# Patient Record
Sex: Female | Born: 1940 | Race: White | Hispanic: No | Smoking: Former smoker
Health system: Southern US, Community
[De-identification: ages and names within clinical notes are randomized; demographics above are authoritative.]

## PROBLEM LIST (undated history)

## (undated) DIAGNOSIS — I4901 Ventricular fibrillation: Secondary | ICD-10-CM

## (undated) DIAGNOSIS — R112 Nausea with vomiting, unspecified: Secondary | ICD-10-CM

## (undated) DIAGNOSIS — I219 Acute myocardial infarction, unspecified: Secondary | ICD-10-CM

## (undated) DIAGNOSIS — Z95810 Presence of automatic (implantable) cardiac defibrillator: Secondary | ICD-10-CM

## (undated) DIAGNOSIS — I10 Essential (primary) hypertension: Secondary | ICD-10-CM

## (undated) DIAGNOSIS — M1990 Unspecified osteoarthritis, unspecified site: Secondary | ICD-10-CM

## (undated) DIAGNOSIS — Z9889 Other specified postprocedural states: Secondary | ICD-10-CM

## (undated) HISTORY — DX: Ventricular fibrillation: I49.01

## (undated) HISTORY — PX: IMPLANTABLE CARDIOVERTER DEFIBRILLATOR IMPLANT: SHX5860

## (undated) HISTORY — DX: Essential (primary) hypertension: I10

---

## 1999-08-10 ENCOUNTER — Inpatient Hospital Stay (HOSPITAL_COMMUNITY): Admission: EM | Admit: 1999-08-10 | Discharge: 1999-08-18 | Payer: Self-pay | Admitting: Emergency Medicine

## 1999-08-10 ENCOUNTER — Encounter: Payer: Self-pay | Admitting: Emergency Medicine

## 1999-08-11 ENCOUNTER — Encounter: Payer: Self-pay | Admitting: Emergency Medicine

## 1999-08-13 ENCOUNTER — Encounter: Payer: Self-pay | Admitting: Critical Care Medicine

## 1999-08-16 ENCOUNTER — Encounter: Payer: Self-pay | Admitting: Internal Medicine

## 1999-08-18 ENCOUNTER — Encounter: Payer: Self-pay | Admitting: *Deleted

## 2004-11-30 ENCOUNTER — Ambulatory Visit: Payer: Self-pay

## 2005-04-28 ENCOUNTER — Ambulatory Visit: Payer: Self-pay | Admitting: Internal Medicine

## 2005-11-08 ENCOUNTER — Ambulatory Visit: Payer: Self-pay | Admitting: Internal Medicine

## 2006-03-15 ENCOUNTER — Ambulatory Visit: Payer: Self-pay | Admitting: Internal Medicine

## 2006-08-25 ENCOUNTER — Ambulatory Visit: Payer: Self-pay

## 2006-09-01 ENCOUNTER — Ambulatory Visit: Payer: Self-pay | Admitting: Internal Medicine

## 2006-10-07 ENCOUNTER — Ambulatory Visit: Payer: Self-pay | Admitting: Internal Medicine

## 2006-10-07 LAB — CONVERTED CEMR LAB
BUN: 14 mg/dL (ref 6–23)
CO2: 30 meq/L (ref 19–32)
Calcium: 9.2 mg/dL (ref 8.4–10.5)
Chloride: 98 meq/L (ref 96–112)
Creatinine, Ser: 0.7 mg/dL (ref 0.4–1.2)
GFR calc non Af Amer: 89 mL/min
Glomerular Filtration Rate, Af Am: 108 mL/min/{1.73_m2}
Glucose, Bld: 97 mg/dL (ref 70–99)
HCT: 40.4 % (ref 36.0–46.0)
Hemoglobin: 13.5 g/dL (ref 12.0–15.0)
INR: 0.9 (ref 0.9–2.0)
MCHC: 33.5 g/dL (ref 30.0–36.0)
MCV: 87.9 fL (ref 78.0–100.0)
Platelets: 271 10*3/uL (ref 150–400)
Potassium: 4.3 meq/L (ref 3.5–5.1)
Prothrombin Time: 11.6 s (ref 10.0–14.0)
RBC: 4.6 M/uL (ref 3.87–5.11)
RDW: 13.8 % (ref 11.5–14.6)
Sodium: 136 meq/L (ref 135–145)
WBC: 7.8 10*3/uL (ref 4.5–10.5)
aPTT: 32.3 s (ref 26.5–36.5)

## 2006-10-14 ENCOUNTER — Ambulatory Visit: Payer: Self-pay | Admitting: Internal Medicine

## 2006-10-14 ENCOUNTER — Ambulatory Visit (HOSPITAL_COMMUNITY): Admission: RE | Admit: 2006-10-14 | Discharge: 2006-10-14 | Payer: Self-pay | Admitting: Internal Medicine

## 2006-10-27 ENCOUNTER — Ambulatory Visit: Payer: Self-pay

## 2007-04-18 ENCOUNTER — Ambulatory Visit: Payer: Self-pay | Admitting: Internal Medicine

## 2007-07-18 ENCOUNTER — Ambulatory Visit: Payer: Self-pay | Admitting: Internal Medicine

## 2007-11-14 ENCOUNTER — Ambulatory Visit: Payer: Self-pay | Admitting: Internal Medicine

## 2008-02-13 ENCOUNTER — Ambulatory Visit: Payer: Self-pay | Admitting: Internal Medicine

## 2008-05-14 ENCOUNTER — Ambulatory Visit: Payer: Self-pay | Admitting: Internal Medicine

## 2008-08-13 ENCOUNTER — Ambulatory Visit: Payer: Self-pay | Admitting: Internal Medicine

## 2008-11-12 ENCOUNTER — Ambulatory Visit: Payer: Self-pay | Admitting: Internal Medicine

## 2009-02-14 ENCOUNTER — Ambulatory Visit: Payer: Self-pay | Admitting: Internal Medicine

## 2009-03-25 ENCOUNTER — Encounter: Payer: Self-pay | Admitting: Internal Medicine

## 2009-05-20 ENCOUNTER — Ambulatory Visit: Payer: Self-pay | Admitting: Internal Medicine

## 2009-06-13 ENCOUNTER — Encounter: Payer: Self-pay | Admitting: Internal Medicine

## 2009-08-19 ENCOUNTER — Ambulatory Visit: Payer: Self-pay | Admitting: Internal Medicine

## 2009-09-03 ENCOUNTER — Encounter: Payer: Self-pay | Admitting: Internal Medicine

## 2009-11-06 ENCOUNTER — Encounter: Payer: Self-pay | Admitting: Internal Medicine

## 2010-01-09 DIAGNOSIS — I10 Essential (primary) hypertension: Secondary | ICD-10-CM | POA: Insufficient documentation

## 2010-02-03 ENCOUNTER — Ambulatory Visit: Payer: Self-pay | Admitting: Internal Medicine

## 2010-02-03 DIAGNOSIS — I4901 Ventricular fibrillation: Secondary | ICD-10-CM

## 2010-05-05 ENCOUNTER — Ambulatory Visit: Payer: Self-pay | Admitting: Internal Medicine

## 2010-08-06 ENCOUNTER — Ambulatory Visit: Payer: Self-pay | Admitting: Internal Medicine

## 2010-09-04 ENCOUNTER — Telehealth: Payer: Self-pay | Admitting: Internal Medicine

## 2010-09-09 ENCOUNTER — Encounter: Payer: Self-pay | Admitting: Internal Medicine

## 2010-11-05 ENCOUNTER — Ambulatory Visit: Payer: Self-pay | Admitting: Internal Medicine

## 2010-11-18 ENCOUNTER — Encounter: Payer: Self-pay | Admitting: Internal Medicine

## 2011-01-26 NOTE — Cardiovascular Report (Signed)
Summary: Office Visit Remote   Office Visit Remote   Imported By: Roderic Ovens 09/15/2010 14:05:31  _____________________________________________________________________  External Attachment:    Type:   Image     Comment:   External Document

## 2011-01-26 NOTE — Assessment & Plan Note (Signed)
Summary: pc2  Medications Added CARVEDILOL 25 MG TABS (CARVEDILOL) 1 by mouth two times a day      Allergies Added:   Visit Type:  Follow-up Primary Provider:  Urgent Care   History of Present Illness: April [name] returns today for followup.  She is a pleasant middle aged woman with a rescucitated VF arrest, s/p ICD implant who has severe HTN.  She denies medical or dietary non-compliance and despite this, has had blood pressures over 200 mmHg in the past.  When I last saw her, we started carvedilol and have uptitrated it now to 25 mg twice daily.  Her blood pressure is much improved.  She denies c/p, sob, or intercurrent ICD therapies.  No syncope. No HA.  Current Medications (verified): 1)  Klor-Con 20 Meq Pack (Potassium Chloride) .... Once Daily 2)  Enalapril Maleate 10 Mg Tabs (Enalapril Maleate) .... Take 1 By Mouth Two Times A Day 3)  Hydrochlorothiazide 25 Mg Tabs (Hydrochlorothiazide) .... Take 1 By Mouth Once Daily 4)  Carvedilol 25 Mg Tabs (Carvedilol) .Marland Kitchen.. 1 By Mouth Two Times A Day  Allergies (verified): 1)  ! Penicillin  Past History:  Past Medical History: Last updated: 01/09/2010 Current Problems:  AICD,  MEDTRONIC GEM III (ICD-V45.02) HYPERTENSION (ICD-401.9)    Review of Systems  The patient denies chest pain, syncope, dyspnea on exertion, and peripheral edema.    Vital Signs:  Patient profile:   70 year old female Height:      64 inches Weight:      147 pounds BMI:     25.32 Pulse rate:   57 / minute BP sitting:   120 / 80  (left arm)  Vitals Entered By: Laurance Flatten CMA (May 05, 2010 4:29 PM)  Physical Exam  General:  Well developed, well nourished, in no acute distress.  HEENT: normal Neck: supple. No JVD. Carotids 2+ bilaterally no bruits Cor: RRR no rubs. A S4 gallop and a soft MR murmur are present. Lungs: CTA.  No wheezes, rales, or rhonchi. Well healed ICD incision. Ab: soft, nontender. nondistended. No HSM. Good bowel sounds Ext:  warm. no cyanosis, clubbing or edema Neuro: alert and oriented. Grossly nonfocal. affect pleasant     ICD Specifications Following MD:  Lewayne Bunting, MD     ICD Vendor:  Medtronic     ICD Model Number:  7232     ICD Serial Number:  [id number] H ICD DOI:  10/14/2006     ICD Implanting MD:  Lewayne Bunting, MD  Lead 1:    Location: RV     DOI: 08/17/1999     Model #: [id number]     Serial #: [id number]     Status: active  Indications::  VF ARREST   ICD Follow Up Remote Check?  No Battery Voltage:  3.07 V     Charge Time:  8.63 seconds     Underlying rhythm:  SR ICD Dependent:  No       ICD Device Measurements Right Ventricle:  Amplitude: 4.2 mV, Impedance: 504 ohms, Threshold: 1.0 V at 0.2 msec Shock Impedance: 48/60 ohms   Episodes Coumadin:  No Shock:  0     ATP:  0     Nonsustained:  0     Ventricular Pacing:  <0.1%  Brady Parameters Mode VVI     Lower Rate Limit:  40      Tachy Zones VF:  182     Next Remote Date:  08/06/2010  Next Cardiology Appt Due:  04/27/2011 Tech Comments:  No parameter changes.  Device function normal.  Carelink transmissions every 3 months.  ROV 1 year with Dr. Ladona Ridgel. Altha Harm, LPN  May 05, 2010 4:25 PM  MD Comments:  Agree with above.  Impression & Recommendations:  Problem # 1:  HYPERTENSION (ICD-401.9) Her blood pressure is much better controlled today.  I have asked her to continue her current meds, maintain a low sodium diet and continue to walk daily. Her updated medication list for this problem includes:    Enalapril Maleate 10 Mg Tabs (Enalapril maleate) .Marland Kitchen... Take 1 by mouth two times a day    Hydrochlorothiazide 25 Mg Tabs (Hydrochlorothiazide) .Marland Kitchen... Take 1 by mouth once daily    Carvedilol 25 Mg Tabs (Carvedilol) .Marland Kitchen... 1 by mouth two times a day  Problem # 2:  AICD,  MEDTRONIC GEM III (ICD-V45.02) Her device is working normally (Maximo VR).  Will recheck in several months.

## 2011-01-26 NOTE — Letter (Signed)
Summary: Remote Device Check  [facility], Main Office  [address]   [place], [place] [address]   Phone: [phone]  Fax: [phone]     November 18, 2010 MRN: [id number]   [facility] [address] [place], [place]  [address]   Dear Ms. April [name],   Your remote transmission was recieved and reviewed by your physician.  All diagnostics were within normal limits for you.  __X___Your next transmission is scheduled for: 02-04-11.  Please transmit at any time this day.  If you have a wireless device your transmission will be sent automatically.   Sincerely,  Vella Kohler

## 2011-01-26 NOTE — Cardiovascular Report (Signed)
Summary: Office Visit Remote   Office Visit Remote   Imported By: Roderic Ovens 11/30/2010 14:50:42  _____________________________________________________________________  External Attachment:    Type:   Image     Comment:   External Document

## 2011-01-26 NOTE — Letter (Signed)
Summary: Remote Device Check  [facility], Main Office  [address]   [place], [place] [address]   Phone: [phone]  Fax: [phone]     September 09, 2010 MRN: [id number]   [facility] [address] [place], [place]  [address]   Dear Ms. April [name],   Your remote transmission was recieved and reviewed by your physician.  All diagnostics were within normal limits for you.  __X___Your next transmission is scheduled for:  11-05-10.  Please transmit at any time this day.  If you have a wireless device your transmission will be sent automatically.   Sincerely,  Vella Kohler

## 2011-01-26 NOTE — Assessment & Plan Note (Signed)
Summary: [email or web address]   CC:  Device Check; HTN.  History of Present Illness: April [name] returns today for followup.  She is a pleasant middle aged woman with a rescucitated VF arrest, s/p ICD implant who has severe HTN.  She denies medical or dietary non-compliance and despite this, has had blood pressures over 200 mmHg!  Despite this, she denies c/p, sob, or intercurrent ICD therapies.  No syncope. No HA.  Problems Prior to Update: 1)  Aicd, Medtronic Gem Iii  (ICD-V45.02) 2)  Hypertension  (ICD-401.9)  Current Medications (verified): 1)  Klor-Con 20 Meq Pack (Potassium Chloride) .... Once Daily 2)  Enalapril Maleate 10 Mg Tabs (Enalapril Maleate) .... Take 1 By Mouth Two Times A Day 3)  Hydrochlorothiazide 25 Mg Tabs (Hydrochlorothiazide) .... Take 1 By Mouth Once Daily 4)  Metoprolol Tartrate 25 Mg Tabs (Metoprolol Tartrate) .... Take One Tablet By Mouth Twice A Day  Allergies (verified): 1)  ! Penicillin  Past History:  Past Medical History: Last updated: 01/09/2010 Current Problems:  AICD,  MEDTRONIC GEM III (ICD-V45.02) HYPERTENSION (ICD-401.9)    Past Surgical History: Last updated: 01/09/2010  DATE OF PROCEDURE:  10/14/2006 PROCEDURE PERFORMED:  Removal of her previously  implanted ICD which had  reached ERI and insertion of a new ICD with defibrillation testing.  PHYSICIAN:  Doylene Canning. Ladona Ridgel, MD  Review of Systems  The patient denies chest pain, syncope, dyspnea on exertion, and peripheral edema.    Vital Signs:  Patient profile:   70 year old female Height:      64 inches Weight:      148 pounds BMI:     25.50 Pulse rate:   66 / minute BP sitting:   218 / 80  (left arm) Cuff size:   regular  Vitals Entered By: Stanton Kidney, EMT-P (February 03, 2010 2:39 PM)  Serial Vital Signs/Assessments:  Time      Position  BP       Pulse  Resp  Temp     By 2:40 PM             204/78                         Stanton Kidney, EMT-P  Comments: 2:40  PM (R) Arm-Sitting By: Stanton Kidney, EMT-P    Physical Exam  General:  Well developed, well nourished, in no acute distress.  HEENT: normal Neck: supple. No JVD. Carotids 2+ bilaterally no bruits Cor: RRR no rubs. A S4 gallop and a soft MR murmur are present. Lungs: CTA.  No wheezes, rales, or rhonchi. Well healed ICD incision. Ab: soft, nontender. nondistended. No HSM. Good bowel sounds Ext: warm. no cyanosis, clubbing or edema Neuro: alert and oriented. Grossly nonfocal. affect pleasant     ICD Specifications Following MD:  Lewayne Bunting, MD     ICD Vendor:  Medtronic     ICD Model Number:  7232     ICD Serial Number:  [id number] H ICD DOI:  10/14/2006     ICD Implanting MD:  Lewayne Bunting, MD  Lead 1:    Location: RV     DOI: 08/17/1999     Model #: 4098     Serial #: [id number]     Status: active  Indications::  VF ARREST   ICD Follow Up Battery Voltage:  3.10 V     Charge Time:  8.48 seconds     Underlying rhythm:  SR ICD  Dependent:  No       ICD Device Measurements Right Ventricle:  Amplitude: 4.2 mV, Impedance: 536 ohms, Threshold: 1.0 V at 0.2 msec Shock Impedance: 56213 ohms   Episodes Shock:  0     ATP:  0     Nonsustained:  1      Brady Parameters Mode VVI     Lower Rate Limit:  40      Tachy Zones VF:  182     Next Remote Date:  05/05/2010     Next Cardiology Appt Due:  01/27/2011 Tech Comments:  No parameter changes.  Device function normal.  Carelink transmissions every 3 months.  ROV 1 year Dr. Ladona Ridgel. April Harm, LPN  February 03, 2010 2:46 PM  MD Comments:  Agree with above.  Impression & Recommendations:  Problem # 1:  AICD,  MEDTRONIC GEM III (ICD-V45.02) Her device is working normally.  Will recheck in several months.  Problem # 2:  HYPERTENSION (ICD-401.9) Her blood pressure remains poorly controlled.  I have recommended changing to carvedilol from metoprolol.  I will start her on 12.5 mg twice daily for two weeks then increase to 25 mg twice  daily.  Ultimately she may require more. A low sodium diet is recommended. Her updated medication list for this problem includes:    Enalapril Maleate 10 Mg Tabs (Enalapril maleate) .Marland Kitchen... Take 1 by mouth two times a day    Hydrochlorothiazide 25 Mg Tabs (Hydrochlorothiazide) .Marland Kitchen... Take 1 by mouth once daily    Carvedilol 25 Mg Tabs (Carvedilol) .Marland Kitchen... Take 1/2 tablet by mouth two times a day for 2 weeks then increase to 1 by mouth two times a day  Her updated medication list for this problem includes:    Enalapril Maleate 10 Mg Tabs (Enalapril maleate) .Marland Kitchen... Take 1 by mouth two times a day    Hydrochlorothiazide 25 Mg Tabs (Hydrochlorothiazide) .Marland Kitchen... Take 1 by mouth once daily    Carvedilol 25 Mg Tabs (Carvedilol) .Marland Kitchen... Take 1/2 tablet by mouth two times a day for 2 weeks then increase to 1 by mouth two times a day  Problem # 3:  VENTRICULAR FIBRILLATION (ICD-427.41) No intercurrent ICD therapies. Her updated medication list for this problem includes:    Enalapril Maleate 10 Mg Tabs (Enalapril maleate) .Marland Kitchen... Take 1 by mouth two times a day    Carvedilol 25 Mg Tabs (Carvedilol) .Marland Kitchen... Take 1/2 tablet by mouth two times a day for 2 weeks then increase to 1 by mouth two times a day  Patient Instructions: 1)  Your physician recommends that you schedule a follow-up appointment in: 3 months with Dr Sherilyn Dacosta 2)  Your physician has recommended you make the following change in your medication: stop Metoprolol start Carvedilol 25mg  1/2 tablet two times a day for 2 weeks then increase to one tablet two times a day  Prescriptions: CARVEDILOL 25 MG TABS (CARVEDILOL) take 1/2 tablet by mouth two times a day for 2 weeks then increase to 1 by mouth two times a day  #60 x 6   Entered by:   Dennis Bast, RN, BSN   Authorized by:   Laren Boom, MD, [facility]   Signed by:   Dennis Bast, RN, BSN on 02/03/2010   Method used:   Electronically to        [facility]  [phone]* (retail)       [address]  [place], [place]  [address]       Ph: [phone] or [phone]       Fax: [phone]   RxID:   [id number]

## 2011-01-26 NOTE — Progress Notes (Signed)
Summary: refill   Phone Note Refill Request Message from:  Patient on September 04, 2010 2:17 PM  Refills Requested: Medication #1:  CARVEDILOL 25 MG TABS 1 by mouth two times a day. [facility] [phone]  pt is out medication need refill today  Initial call taken by: Judie Grieve,  September 04, 2010 2:18 PM    Prescriptions: CARVEDILOL 25 MG TABS (CARVEDILOL) 1 by mouth two times a day  #60 x 3   Entered by:   Laurance Flatten CMA   Authorized by:   Laren Boom, MD, [facility]   Signed by:   Laurance Flatten CMA on 09/04/2010   Method used:   Electronically to        [facility]  [phone]* (retail)       [address]       [place], [place]  [address]       Ph: [phone] or [phone]       Fax: [phone]   RxID:   [id number]

## 2011-02-04 ENCOUNTER — Encounter (INDEPENDENT_AMBULATORY_CARE_PROVIDER_SITE_OTHER): Payer: 59

## 2011-02-04 ENCOUNTER — Encounter: Payer: Self-pay | Admitting: Internal Medicine

## 2011-02-04 DIAGNOSIS — I428 Other cardiomyopathies: Secondary | ICD-10-CM

## 2011-02-23 NOTE — Cardiovascular Report (Signed)
Summary: Office Visit   Office Visit   Imported By: Roderic Ovens 02/17/2011 15:48:57  _____________________________________________________________________  External Attachment:    Type:   Image     Comment:   External Document

## 2011-05-11 NOTE — Assessment & Plan Note (Signed)
[facility]                         ELECTROPHYSIOLOGY OFFICE NOTE   NAME:COPPAGEParveen, [name]                         MRN:          [id number]  DATE:11/12/2008                            DOB:          15-Jul-1941    April [name] returns today for followup.  She is a very pleasant middle-  aged woman with a VF arrest status post resuscitation with a history of  hypertension, status post ICD insertion.  She returns today for  followup.  She has been stable.  She continues to exercise regularly.  Despite this, her blood pressure has remained elevated.  She denies  chest pain.  She denies shortness of breath.  She has had no  intercurrent ICD therapies.  She notes that her blood pressure remains  elevated some, but for the most part has been recently well controlled.   CURRENT MEDICATIONS:  1. Hydrochlorothiazide 25 a day.  2. Potassium 20 a day.  3. Enalapril twice daily (I think 5 mg).  4. Lopressor 25 twice a day.   PHYSICAL EXAMINATION:  GENERAL:  She is a pleasant middle-aged woman in  no distress.  VITAL SIGNS:  Blood pressure 158/72, by me 140/80, the pulse 62 and  regular, the respirations were 18, and the weight was 142 pounds down 9  pounds for her visit a year ago.  NECK:  No jugular venous distention.  LUNGS:  Clear bilaterally to auscultation.  No wheezes, rales, or  rhonchi are present.  There is no increased work of breathing.  CARDIOVASCULAR:  Regular rate rhythm.  Normal S1 and S2.  Soft S4 gallop  was present.  ABDOMEN:  Soft, nontender.  There was no organomegaly.  EXTREMITIES:  No edema.   Interrogation of the defibrillator demonstrates a Medtronic Maximo.  The  R-waves were 4.  The impedance 520.  The threshold a volt of 0.2.  Battery voltage was 3.13 V.  There are no intercurrent ICD therapies.  She had one episode of nonsustained VT.   IMPRESSION:  1. Status post ventricular fibrillation arrest.  2. Hypertension.  3. Status post  implantable cardioverter-defibrillator insertion.   DISCUSSION:  Overall, April [name] is stable and her defibrillator is  working normally.  She has had only 1 episode of nonsustained VT.  She  does have hypertension which is moderately well controlled, and she will  continue on her medicines for this.  I will see the patient back in the  office for ICD followup in 1 year.    Doylene Canning. Ladona Ridgel, MD  Electronically Signed   GWT/MedQ  DD: 11/12/2008  DT: 11/13/2008  Job #: [id number]

## 2011-05-11 NOTE — Assessment & Plan Note (Signed)
[facility]                         ELECTROPHYSIOLOGY OFFICE NOTE   NAME:[name], [name]                         MRN:          [id number]  DATE:11/14/2007                            DOB:          1941-02-06    April [name] returns today for follow-up.  She is a very pleasant middle-  aged woman with a history of primary VF arrest status post  resuscitation, history of hypertension, who returns for follow-up.  She  is status post ICD insertion. She denies chest pain or shortness of  breath and overall has felt well.  She does note that she tripped and  fell earlier today and has a laceration on her chin but denies any other  problems or complaints from her fall.   MEDICATIONS:  1. Hydrochlorothiazide 25 mg daily.  2. Potassium 20 mEq daily.  3. Enalapril 5 mg twice daily.  4. Lopressor 25 mg twice daily.   PHYSICAL EXAMINATION:  VITAL SIGNS:  Blood pressure was 110/70, pulse 84  and regular, respirations 18, weight 151 pounds.  GENERAL APPEARANCE:  She is a pleasant, well-appearing middle-aged woman  in no distress.  NECK:  No jugular venous distension.  LUNGS:  Clear to auscultation bilaterally.  No wheezing, rhonchi or  rales were present.  There was no increased work of breathing.  EXTREMITIES:  No clubbing, cyanosis, or edema.  Pulses were 2+ and  symmetric.   Interrogation of her defibrillator demonstrates a Medtronic Maximo with  R-waves of 6, impedance 552 ohms, threshold is a volt at 0.2.  Battery  voltage was 3.17 volts.  There were no intercurrent IC therapies.  There  was one nonsustained episode of VT.   IMPRESSION:  1. Primary ventricular fibrillation arrest.  2. Hypertension.   DISCUSSION:  Overall, April [name] is stable.  Her defibrillator is  working normally and she has had no intercurrent IC therapies.  We will  plan to see her back in the clinic in a year with me and follow up in  our CareLink program.     Doylene Canning.  Ladona Ridgel, MD  Electronically Signed    GWT/MedQ  DD: 11/14/2007  DT: 11/15/2007  Job #: [id number]

## 2011-05-14 NOTE — Assessment & Plan Note (Signed)
[facility]                           ELECTROPHYSIOLOGY OFFICE NOTE   NAME:COPPAGEMayia, Megill                         MRN:          [id number]  DATE:09/01/2006                            DOB:          1941/09/17    Ms. Lehan returns today in follow up. She is a very pleasant 70 year old  woman with history of VF arrest, ischemic heart disease and hypertension  status post ICD insertion, who is now ERI.  She started having beeping  sounds over Labor Day. She denies chest pain or shortness of breath. She  does note that whenever she comes to our office her blood pressure is  elevated and she feels tense.  She states that her blood pressure is not  elevated when she is at home.   PHYSICAL EXAMINATION:  GENERAL:  She is a pleasant, well-appearing middle-  aged woman in no distress.  VITAL SIGNS:  Blood pressure was 184/82, the pulse 74 and regular,  respirations were 18. The weight was 152 pounds.  NECK:  Revealed no jugular venous distension.  LUNGS:  Clear bilaterally to auscultation.  CARDIOVASCULAR EXAM:  Regular rate and rhythm with normal S1 and S2, there  is a soft S4 gallop present.  EXTREMITIES:  Demonstrated no edema.   EKG demonstrates sinus rhythm, normal axis and intervals.   INTERROGATION OF DEFIBRILLATOR:  Confirms that she is in fact ERI with a  voltage of 2.55 volts. She has had no intercurrent IC therapies.   IMPRESSION:  1. Ischemic heart disease.  2. Status post resuscitative VF arrest.  3. Hypertension.  4. Status post ICD insertion, now at [facility].   DISCUSSION:  The risks, benefits, goals and expectations of ICD generator  change have been discussed with the patient and she would like to proceed  with this. We will schedule it at the earliest possible convenient time.                                   Doylene Canning. Ladona Ridgel, MD   GWT/MedQ  DD:  09/01/2006  DT:  09/01/2006  Job #:  [id number]

## 2011-05-14 NOTE — Assessment & Plan Note (Signed)
[facility]                         ELECTROPHYSIOLOGY OFFICE NOTE   NAME:[name], April [name]                         MRN:          [id number]  DATE:04/18/2007                            DOB:          November 28, 1941    April [name] returns today for followup.  She is a very pleasant, 70-year-  old woman with a history of VF arrest, ischemic heart disease with  preserved LV function, and hypertension.  She is status post ICD  insertion, recently undergoing ICD generator change.  She returns today  for followup.  She has been under increasing stress lately secondary to  her daughter's baby being born with Arnold-Chiari and having a very  difficult, prolonged stay in the hospital.  The patient denies chest  pain, she denies shortness of breath, she denies peripheral edema.   MEDICATIONS:  HCTZ 25 a day, potassium, Enalapril, and Lopressor.   PHYSICAL EXAMINATION:  GENERAL:  A pleasant, well-appearing, middle-aged  woman in no distress.  VITAL SIGNS:  The blood pressure was 200/80, the pulse 80 and regular,  the respirations were 18, and the weight was 154 pounds.  NECK:  No jugulovenous distention.  LUNGS:  Clear bilaterally to auscultation, no wheezes, rales, or  rhonchi.  CARDIOVASCULAR:  Regular rate and rhythm with normal S1 and S2, there is  a soft S4 gallop.  EXTREMITIES:  No edema.   Interrogation of her defibrillator demonstrates a Medtronic Maximo with  R-waves of 8, the impedance 552 ohms, a threshold 1 volt at 0.2 msec.  The battery voltage was 3.17 volts.  She was 0% V-paced.   IMPRESSION:  1. Coronary artery disease.  2. Status post ventricular fibrillation arrest.  3. Status post implantable cardioverter-defibrillator insertion.  4. Hypertension.   DISCUSSION:  April [name] is stable, her blood pressure is elevated  though she does note to be under extra stress.  I have recommended that  she continue her medical therapy and call us if her blood  pressure  remains persistently elevated.     Doylene Canning. Ladona Ridgel, MD  Electronically Signed    GWT/MedQ  DD: 04/18/2007  DT: 04/18/2007  Job #: [id number]

## 2011-05-14 NOTE — Assessment & Plan Note (Signed)
[facility]                           ELECTROPHYSIOLOGY OFFICE NOTE   NAME:Delvecchio, KASSADEE                         MRN:          [id number]  DATE:08/25/2006                            DOB:          [birth year]    Ms. Thomann was seen in the clinic today, on 25 August 2006, for follow up  of her Medtronic model #7229 Gem.  Date of implant was August 17, 1999, for  VF and sudden cardiac death.   On interrogation of her device today her battery voltage is 2.55 which is  elective replacement with a charge time of 10.84 seconds.  R waves measured  12 mV with ventricular pacing threshold of 1.3 msec and a ventricular lead  impedance of 611 ohms.  Shock impedance was 14.  There were no episodes  since last interrogation.   She is scheduled to see Dr. Ladona Ridgel next week for a workup for her change out  of her device.                                   Altha Harm, LPN                                Doylene Canning. Ladona Ridgel, MD   PO/MedQ  DD:  08/26/2006  DT:  08/26/2006  Job #:  [id number]

## 2011-05-14 NOTE — Op Note (Signed)
[name], [name]                ACCOUNT NO.:  [id number]   MEDICAL RECORD NO.:  [id number]          PATIENT TYPE:  OIB   LOCATION:  2899                         FACILITY:  [facility]   PHYSICIAN:  Doylene Canning. Ladona Ridgel, MD    DATE OF BIRTH:  1941-03-09   DATE OF PROCEDURE:  10/14/2006  DATE OF DISCHARGE:  10/14/2006                                 OPERATIVE REPORT   PROCEDURE PERFORMED:  Removal of her previously  implanted ICD which had  reached ERI and insertion of a new ICD with defibrillation testing.   INTRODUCTION:  The patient is a very pleasant 70 year old woman who first  presented with a resuscitated VF arrest back in 2000.  She underwent  implantation of a Gem ICD, a Medtronic Gem III ICD.  Her device has  subsequently reached ERI over 7 years out from initial implant.  She is now  referred for generator change.   PROCEDURE:  After informed consent was obtained, the patient is taken  diagnostic EP lab in fasting state.  After usual preparation and draping,  intravenous fentanyl Midazolam was given for sedation.  Also the patient was  given intravenous Phenergan secondary to a history of nausea with fentanyl  and with sedation.  30 mL lidocaine was infiltrated into the left  infraclavicular region.  A 7 cm incision was carried out over this region.  Electrocautery utilized to dissect down to the fascial plane.  The  subcutaneous pocket was entered without difficulty and electrocautery  utilized to free up the pocket from its dense fibrous adhesions.  This  Medtronic generator was subsequently removed without difficulty.  The lead  was firm freed up from the generator and the R-waves measured 13, the  impedance was 606 ohms and threshold 0.8 volts at 0.5 milliseconds.  10  volts pacing did not stimulate the diaphragm.  With satisfactory  demonstration of lead function, the new Medtronic Maximo VR model 7232  single chamber defibrillator, serial number [id number] was connected to  the  defibrillation lead and placed back in the subcutaneous pocket.  Kanamycin  irrigation was utilized to irrigate the pocket.  Electrocautery used to  assure hemostasis.  At this point the patient was more deeply sedated and  defibrillation threshold testing carried out.   After the patient was more deeply sedated with fentanyl and Versed, VF was  induced with T-wave shock.  A 15 joules shock was subsequently delivered  which terminated VF and restored sinus rhythm.  At this point no additional  defibrillation threshold testing was carried out and the incision was closed  with layer of 2-0 Vicryl followed by layer of 3-0 Vicryl followed by layer  of 4-0 Vicryl.  Benzoin was painted on the skin, Steri-Strips were applied  and pressure dressing was placed.  The patient was returned to her room in  satisfactory condition.   COMPLICATIONS:  There no immediate procedure complications.   RESULTS:  This demonstrates successful removal of previously implanted  Medtronic defibrillator and insertion of a new Medtronic device with  defibrillation threshold testing.  ______________________________  Doylene Canning. Ladona Ridgel, MD     GWT/MEDQ  D:  10/14/2006  T:  10/15/2006  Job:  [id number]

## 2011-05-14 NOTE — Assessment & Plan Note (Signed)
[facility]                           ELECTROPHYSIOLOGY OFFICE NOTE   NAME:COPPAGEAnnelise, April [name]                         MRN:          [id number]  DATE:10/27/2006                            DOB:          February 09, 1941    [name] was seen today in the clinic on October 27, 2006, for a wound  check of her newly changed-out Medtronic model number 7232 Maximo.  Date of  change-out was October 14, 2006, for VF arrest.  On interrogation of her  device today, her battery voltage is 3.20 with a charge time of 8.69  seconds.  R waves measured 6.9 millivolts with a ventricular pacing  threshold of 1 volt at 0.2 milliseconds and a ventricular lead impedance of  552.  Shock impedance was 49.  There were no episodes since implant date.  Wavelet and onset were programmed on.  Her Steri-Strips were removed today.  Her wound was without redness or edema and she will be seen again in 3  months' time.      Altha Harm, LPN  Electronically Signed      Doylene Canning. Ladona Ridgel, MD  Electronically Signed   PO/MedQ  DD: 10/27/2006  DT: 10/27/2006  Job #: [id number]

## 2011-05-26 ENCOUNTER — Encounter: Payer: Medicare Other | Admitting: *Deleted

## 2011-06-02 ENCOUNTER — Other Ambulatory Visit: Payer: Self-pay | Admitting: Internal Medicine

## 2011-06-24 ENCOUNTER — Ambulatory Visit (INDEPENDENT_AMBULATORY_CARE_PROVIDER_SITE_OTHER): Payer: 59 | Admitting: *Deleted

## 2011-06-24 DIAGNOSIS — I4901 Ventricular fibrillation: Secondary | ICD-10-CM

## 2011-06-24 NOTE — Progress Notes (Signed)
icd checked in device clinic/ rov in 3 months with Dr. Ladona Ridgel.

## 2011-07-01 ENCOUNTER — Other Ambulatory Visit: Payer: Self-pay | Admitting: Internal Medicine

## 2011-07-29 ENCOUNTER — Other Ambulatory Visit: Payer: Self-pay | Admitting: Internal Medicine

## 2011-09-16 ENCOUNTER — Encounter: Payer: 59 | Admitting: Internal Medicine

## 2011-09-28 ENCOUNTER — Other Ambulatory Visit: Payer: Self-pay | Admitting: Internal Medicine

## 2011-09-29 ENCOUNTER — Other Ambulatory Visit: Payer: Self-pay

## 2011-09-29 DIAGNOSIS — I4901 Ventricular fibrillation: Secondary | ICD-10-CM

## 2011-09-29 MED ORDER — CARVEDILOL 25 MG PO TABS: 25.0000 mg | ORAL_TABLET | Freq: Two times a day (BID) | ORAL | Status: DC

## 2011-10-27 ENCOUNTER — Ambulatory Visit (INDEPENDENT_AMBULATORY_CARE_PROVIDER_SITE_OTHER): Payer: 59 | Admitting: Internal Medicine

## 2011-10-27 ENCOUNTER — Encounter: Payer: Self-pay | Admitting: *Deleted

## 2011-10-27 ENCOUNTER — Encounter: Payer: Self-pay | Admitting: Internal Medicine

## 2011-10-27 DIAGNOSIS — I4901 Ventricular fibrillation: Secondary | ICD-10-CM

## 2011-10-27 DIAGNOSIS — Z95810 Presence of automatic (implantable) cardiac defibrillator: Secondary | ICD-10-CM

## 2011-10-27 DIAGNOSIS — I10 Essential (primary) hypertension: Secondary | ICD-10-CM

## 2011-10-27 NOTE — Assessment & Plan Note (Signed)
Her device is working normally. She has had no recent ICD therapies. We'll plan to recheck in several months.

## 2011-10-27 NOTE — Progress Notes (Signed)
HPI April [name] returns today for followup. She is a pleasant 70 year old woman with a history of ventricular fibrillation arrest, hypertension, status post ICD insertion. The patient continues to do well. Her only problem is that she has not been able to exercise and notes that her blood pressure has been increased. She has gained a few pounds since her last visit. She denies chest pain, shortness of breath, or peripheral edema. Her inability to exercise has been do to a groin strain. She is nearly back to normal. Allergies  Allergen Reactions  . Codeine   . Penicillins      Current Outpatient Prescriptions  Medication Sig Dispense Refill  . acetaminophen (TYLENOL) 650 MG CR tablet Take 650 mg by mouth every 8 (eight) hours as needed.        Marland Kitchen aspirin 325 MG tablet Take 325 mg by mouth daily.        . carvedilol (COREG) 25 MG tablet Take 1 tablet (25 mg total) by mouth 2 (two) times daily with a meal.  60 tablet  3  . enalapril (VASOTEC) 10 MG tablet TAKE ONE TABLET BY MOUTH TWICE DAILY  60 tablet  12  . hydrochlorothiazide 25 MG tablet TAKE ONE TABLET BY MOUTH EVERY DAY  90 tablet  3  . KLOR-CON M20 20 MEQ tablet TAKE ONE TABLET BY MOUTH EVERY DAY  30 each  12  . potassium chloride (KLOR-CON) 20 MEQ packet Take 20 mEq by mouth daily.           Past Medical History  Diagnosis Date  . HTN (hypertension)   . Ventricular fibrillation     ROS:   All systems reviewed and negative except as noted in the HPI.   Past Surgical History  Procedure Date  . Cardiac defibrillator placement     medtronic     No family history on file.   History   Social History  . Marital Status: Widowed    Spouse Name: N/A    Number of Children: N/A  . Years of Education: N/A   Occupational History  . Not on file.   Social History Main Topics  . Smoking status: Former Smoker    Quit date: 10/27/1999  . Smokeless tobacco: Not on file  . Alcohol Use: Not on file  . Drug Use: Not on file    . Sexually Active: Not on file   Other Topics Concern  . Not on file   Social History Narrative  . No narrative on file     BP 160/96  Pulse 66  Ht 5' 4.5" (1.638 m)  Wt 151 lb 6.4 oz (68.675 kg)  BMI 25.59 kg/m2  Physical Exam:  Well appearing NAD HEENT: Unremarkable Neck:  No JVD, no thyromegally Lymphatics:  No adenopathy Back:  No CVA tenderness Lungs:  Clear with no wheezes, rales, or rhonchi. Well-healed ICD incision HEART:  Regular rate rhythm, no murmurs, no rubs, no clicks Abd:  soft, positive bowel sounds, no organomegally, no rebound, no guarding Ext:  2 plus pulses, no edema, no cyanosis, no clubbing Skin:  No rashes no nodules Neuro:  CN II through XII intact, motor grossly intact  DEVICE  Normal device function.  See PaceArt for details.   Assess/Plan:

## 2011-10-27 NOTE — Assessment & Plan Note (Signed)
Her blood pressure is elevated today. She will try to work on reducing her salt intake. She notes that at home it has been better.

## 2011-10-27 NOTE — Assessment & Plan Note (Signed)
She has had no recurrent ventricular arrhythmias since her last ICD check. We'll continue to follow.

## 2011-10-27 NOTE — Patient Instructions (Signed)
Remote monitoring is used to monitor your Pacemaker of ICD from home. This monitoring reduces the number of office visits required to check your device to one time per year. It allows us to keep an eye on the functioning of your device to ensure it is working properly. You are scheduled for a device check from home on 01/27/12. You may send your transmission at any time that day. If you have a wireless device, the transmission will be sent automatically. After your physician reviews your transmission, you will receive a postcard with your next transmission date.   Your physician wants you to follow-up in: 1 year with Dr. Taylor. You will receive a reminder letter in the mail two months in advance. If you don't receive a letter, please call our office to schedule the follow-up appointment.   Your physician recommends that you continue on your current medications as directed. Please refer to the Current Medication list given to you today.  

## 2012-01-19 ENCOUNTER — Other Ambulatory Visit: Payer: Self-pay | Admitting: Internal Medicine

## 2012-01-20 ENCOUNTER — Other Ambulatory Visit: Payer: Self-pay

## 2012-01-20 DIAGNOSIS — I4901 Ventricular fibrillation: Secondary | ICD-10-CM

## 2012-01-20 MED ORDER — CARVEDILOL 25 MG PO TABS: 25.0000 mg | ORAL_TABLET | Freq: Two times a day (BID) | ORAL | Status: DC

## 2012-01-27 ENCOUNTER — Encounter: Payer: Self-pay | Admitting: Internal Medicine

## 2012-01-27 ENCOUNTER — Ambulatory Visit (INDEPENDENT_AMBULATORY_CARE_PROVIDER_SITE_OTHER): Payer: 59 | Admitting: *Deleted

## 2012-01-27 DIAGNOSIS — Z95810 Presence of automatic (implantable) cardiac defibrillator: Secondary | ICD-10-CM

## 2012-01-27 DIAGNOSIS — I4901 Ventricular fibrillation: Secondary | ICD-10-CM

## 2012-01-29 LAB — REMOTE ICD DEVICE
BATTERY VOLTAGE: 2.99 V
RV LEAD AMPLITUDE: 6.5 mv
RV LEAD IMPEDENCE ICD: 496 Ohm
TZAT-0001FASTVT: 2
TZAT-0001FASTVT: 3
TZAT-0001SLOWVT: 3
TZAT-0001SLOWVT: 4
TZAT-0002FASTVT: NEGATIVE
TZAT-0002FASTVT: NEGATIVE
TZAT-0002FASTVT: NEGATIVE
TZAT-0002FASTVT: NEGATIVE
TZAT-0002SLOWVT: NEGATIVE
TZAT-0002SLOWVT: NEGATIVE
TZAT-0012FASTVT: 200 ms
TZAT-0012FASTVT: 200 ms
TZAT-0012FASTVT: 200 ms
TZAT-0012SLOWVT: 200 ms
TZAT-0012SLOWVT: 200 ms
TZAT-0018FASTVT: NEGATIVE
TZAT-0018FASTVT: NEGATIVE
TZAT-0018FASTVT: NEGATIVE
TZAT-0018SLOWVT: NEGATIVE
TZAT-0018SLOWVT: NEGATIVE
TZAT-0018SLOWVT: NEGATIVE
TZAT-0018SLOWVT: NEGATIVE
TZAT-0019FASTVT: 8 V
TZAT-0019FASTVT: 8 V
TZAT-0019FASTVT: 8 V
TZAT-0019FASTVT: 8 V
TZAT-0019SLOWVT: 8 V
TZAT-0019SLOWVT: 8 V
TZAT-0019SLOWVT: 8 V
TZAT-0019SLOWVT: 8 V
TZAT-0020FASTVT: 1.6 ms
TZAT-0020FASTVT: 1.6 ms
TZAT-0020FASTVT: 1.6 ms
TZAT-0020FASTVT: 1.6 ms
TZAT-0020SLOWVT: 1.6 ms
TZAT-0020SLOWVT: 1.6 ms
TZAT-0020SLOWVT: 1.6 ms
TZON-0003SLOWVT: 400 ms
TZON-0004SLOWVT: 16
TZON-0008FASTVT: 0 ms
TZON-0008SLOWVT: 0 ms

## 2012-02-02 ENCOUNTER — Encounter: Payer: Self-pay | Admitting: *Deleted

## 2012-02-02 NOTE — Progress Notes (Signed)
Remote defib check  

## 2012-04-27 ENCOUNTER — Encounter: Payer: Self-pay | Admitting: Internal Medicine

## 2012-04-27 ENCOUNTER — Ambulatory Visit (INDEPENDENT_AMBULATORY_CARE_PROVIDER_SITE_OTHER): Payer: 59 | Admitting: *Deleted

## 2012-04-27 DIAGNOSIS — I4901 Ventricular fibrillation: Secondary | ICD-10-CM

## 2012-05-01 LAB — REMOTE ICD DEVICE
CHARGE TIME: 9.45 s
RV LEAD AMPLITUDE: 7.1 mv
TZAT-0001FASTVT: 2
TZAT-0001FASTVT: 3
TZAT-0001FASTVT: 4
TZAT-0001SLOWVT: 4
TZAT-0001SLOWVT: 5
TZAT-0002FASTVT: NEGATIVE
TZAT-0002SLOWVT: NEGATIVE
TZAT-0002SLOWVT: NEGATIVE
TZAT-0002SLOWVT: NEGATIVE
TZAT-0012FASTVT: 200 ms
TZAT-0012FASTVT: 200 ms
TZAT-0012FASTVT: 200 ms
TZAT-0012SLOWVT: 200 ms
TZAT-0012SLOWVT: 200 ms
TZAT-0012SLOWVT: 200 ms
TZAT-0018FASTVT: NEGATIVE
TZAT-0018FASTVT: NEGATIVE
TZAT-0018FASTVT: NEGATIVE
TZAT-0018FASTVT: NEGATIVE
TZAT-0018SLOWVT: NEGATIVE
TZAT-0018SLOWVT: NEGATIVE
TZAT-0018SLOWVT: NEGATIVE
TZAT-0019FASTVT: 8 V
TZAT-0019FASTVT: 8 V
TZAT-0019FASTVT: 8 V
TZAT-0019SLOWVT: 8 V
TZAT-0019SLOWVT: 8 V
TZAT-0019SLOWVT: 8 V
TZAT-0019SLOWVT: 8 V
TZAT-0020FASTVT: 1.6 ms
TZAT-0020FASTVT: 1.6 ms
TZAT-0020SLOWVT: 1.6 ms
TZAT-0020SLOWVT: 1.6 ms
TZON-0003SLOWVT: 400 ms

## 2012-05-05 NOTE — Progress Notes (Signed)
ICD remote 

## 2012-05-12 ENCOUNTER — Encounter: Payer: Self-pay | Admitting: *Deleted

## 2012-06-14 ENCOUNTER — Other Ambulatory Visit: Payer: Self-pay | Admitting: *Deleted

## 2012-06-14 MED ORDER — ENALAPRIL MALEATE 10 MG PO TABS: 10.0000 mg | ORAL_TABLET | Freq: Two times a day (BID) | ORAL | Status: DC

## 2012-06-14 NOTE — Telephone Encounter (Signed)
Fax Received. Refill Completed. April [name] (R.M.A)   

## 2012-07-09 ENCOUNTER — Other Ambulatory Visit: Payer: Self-pay | Admitting: Internal Medicine

## 2012-08-03 ENCOUNTER — Encounter: Payer: Self-pay | Admitting: Internal Medicine

## 2012-08-03 ENCOUNTER — Ambulatory Visit (INDEPENDENT_AMBULATORY_CARE_PROVIDER_SITE_OTHER): Payer: 59 | Admitting: *Deleted

## 2012-08-03 DIAGNOSIS — I4901 Ventricular fibrillation: Secondary | ICD-10-CM

## 2012-08-06 LAB — REMOTE ICD DEVICE
BRDY-0002RV: 40 {beats}/min
DEV-0020ICD: NEGATIVE
RV LEAD AMPLITUDE: 6.1 mv
TOT-0006: 20130502000000
TZAT-0001FASTVT: 1
TZAT-0001FASTVT: 2
TZAT-0001FASTVT: 5
TZAT-0001FASTVT: 6
TZAT-0001SLOWVT: 2
TZAT-0001SLOWVT: 3
TZAT-0001SLOWVT: 5
TZAT-0001SLOWVT: 6
TZAT-0002FASTVT: NEGATIVE
TZAT-0002FASTVT: NEGATIVE
TZAT-0002FASTVT: NEGATIVE
TZAT-0002SLOWVT: NEGATIVE
TZAT-0002SLOWVT: NEGATIVE
TZAT-0002SLOWVT: NEGATIVE
TZAT-0002SLOWVT: NEGATIVE
TZAT-0012FASTVT: 200 ms
TZAT-0012FASTVT: 200 ms
TZAT-0012FASTVT: 200 ms
TZAT-0012FASTVT: 200 ms
TZAT-0012SLOWVT: 200 ms
TZAT-0012SLOWVT: 200 ms
TZAT-0012SLOWVT: 200 ms
TZAT-0018FASTVT: NEGATIVE
TZAT-0018FASTVT: NEGATIVE
TZAT-0018FASTVT: NEGATIVE
TZAT-0018SLOWVT: NEGATIVE
TZAT-0018SLOWVT: NEGATIVE
TZAT-0018SLOWVT: NEGATIVE
TZAT-0019FASTVT: 8 V
TZAT-0019FASTVT: 8 V
TZAT-0019SLOWVT: 8 V
TZAT-0019SLOWVT: 8 V
TZAT-0020FASTVT: 1.6 ms
TZAT-0020FASTVT: 1.6 ms
TZAT-0020FASTVT: 1.6 ms
TZAT-0020FASTVT: 1.6 ms
TZAT-0020SLOWVT: 1.6 ms
TZAT-0020SLOWVT: 1.6 ms
TZAT-0020SLOWVT: 1.6 ms
TZON-0005SLOWVT: 12
TZON-0008SLOWVT: 0 ms
TZON-0011AFLUTTER: 70
VENTRICULAR PACING ICD: 0 pct

## 2012-08-08 ENCOUNTER — Encounter: Payer: Self-pay | Admitting: *Deleted

## 2012-11-13 ENCOUNTER — Encounter: Payer: 59 | Admitting: Internal Medicine

## 2012-11-29 ENCOUNTER — Other Ambulatory Visit: Payer: Self-pay | Admitting: Internal Medicine

## 2012-12-28 ENCOUNTER — Other Ambulatory Visit: Payer: Self-pay | Admitting: Internal Medicine

## 2013-01-11 ENCOUNTER — Ambulatory Visit (INDEPENDENT_AMBULATORY_CARE_PROVIDER_SITE_OTHER): Payer: 59 | Admitting: Internal Medicine

## 2013-01-11 ENCOUNTER — Encounter: Payer: Self-pay | Admitting: Internal Medicine

## 2013-01-11 VITALS — BP 201/85 | HR 73 | Ht 64.5 in | Wt 150.2 lb

## 2013-01-11 DIAGNOSIS — I4901 Ventricular fibrillation: Secondary | ICD-10-CM

## 2013-01-11 DIAGNOSIS — I10 Essential (primary) hypertension: Secondary | ICD-10-CM

## 2013-01-11 DIAGNOSIS — Z95810 Presence of automatic (implantable) cardiac defibrillator: Secondary | ICD-10-CM

## 2013-01-11 LAB — ICD DEVICE OBSERVATION
BATTERY VOLTAGE: 2.81 V
FVT: 0
PACEART VT: 0
TZAT-0001FASTVT: 3
TZAT-0001FASTVT: 5
TZAT-0001FASTVT: 6
TZAT-0001SLOWVT: 1
TZAT-0001SLOWVT: 2
TZAT-0001SLOWVT: 3
TZAT-0001SLOWVT: 6
TZAT-0002FASTVT: NEGATIVE
TZAT-0002FASTVT: NEGATIVE
TZAT-0002FASTVT: NEGATIVE
TZAT-0002SLOWVT: NEGATIVE
TZAT-0002SLOWVT: NEGATIVE
TZAT-0002SLOWVT: NEGATIVE
TZAT-0012FASTVT: 200 ms
TZAT-0012FASTVT: 200 ms
TZAT-0012FASTVT: 200 ms
TZAT-0012SLOWVT: 200 ms
TZAT-0012SLOWVT: 200 ms
TZAT-0012SLOWVT: 200 ms
TZAT-0018FASTVT: NEGATIVE
TZAT-0018FASTVT: NEGATIVE
TZAT-0018FASTVT: NEGATIVE
TZAT-0018SLOWVT: NEGATIVE
TZAT-0018SLOWVT: NEGATIVE
TZAT-0018SLOWVT: NEGATIVE
TZAT-0018SLOWVT: NEGATIVE
TZAT-0019FASTVT: 8 V
TZAT-0019FASTVT: 8 V
TZAT-0019FASTVT: 8 V
TZAT-0019FASTVT: 8 V
TZAT-0019SLOWVT: 8 V
TZAT-0019SLOWVT: 8 V
TZAT-0019SLOWVT: 8 V
TZAT-0019SLOWVT: 8 V
TZAT-0019SLOWVT: 8 V
TZAT-0020FASTVT: 1.6 ms
TZAT-0020FASTVT: 1.6 ms
TZAT-0020FASTVT: 1.6 ms
TZAT-0020SLOWVT: 1.6 ms
TZAT-0020SLOWVT: 1.6 ms
TZAT-0020SLOWVT: 1.6 ms
TZON-0003SLOWVT: 400 ms
TZON-0004SLOWVT: 16
TZON-0005SLOWVT: 12
TZON-0008FASTVT: 0 ms
VENTRICULAR PACING ICD: 0 pct
VF: 0

## 2013-01-11 MED ORDER — ENALAPRIL MALEATE 10 MG PO TABS: 10.0000 mg | ORAL_TABLET | Freq: Every day | ORAL | Status: DC

## 2013-01-11 MED ORDER — HYDROCHLOROTHIAZIDE 25 MG PO TABS: 25.0000 mg | ORAL_TABLET | Freq: Every day | ORAL | Status: DC

## 2013-01-11 MED ORDER — CARVEDILOL 25 MG PO TABS: ORAL_TABLET | ORAL | Status: DC

## 2013-01-11 MED ORDER — POTASSIUM CHLORIDE CRYS ER 20 MEQ PO TBCR: 20.0000 meq | EXTENDED_RELEASE_TABLET | Freq: Every day | ORAL | Status: DC

## 2013-01-11 NOTE — Assessment & Plan Note (Signed)
Her device is working normally. She has had no recurrent ventricular arrhythmias. We'll recheck in several months.

## 2013-01-11 NOTE — Progress Notes (Signed)
HPI [name] returns today for followup. She is a pleasant 72 yo woman with a h/o VF arrest, s/p ICD implant and severe HTN. Allergies  Allergen Reactions  . Codeine   . Penicillins      Current Outpatient Prescriptions  Medication Sig Dispense Refill  . acetaminophen (TYLENOL) 650 MG CR tablet Take 650 mg by mouth every 8 (eight) hours as needed.        Marland Kitchen aspirin 325 MG tablet Take 325 mg by mouth daily.        . carvedilol (COREG) 25 MG tablet Take 1 1/2 tablets twice daily  270 tablet  3  . enalapril (VASOTEC) 10 MG tablet Take 1 tablet (10 mg total) by mouth daily.  90 tablet  3  . hydrochlorothiazide (HYDRODIURIL) 25 MG tablet Take 1 tablet (25 mg total) by mouth daily.  90 tablet  3  . potassium chloride SA (KLOR-CON M20) 20 MEQ tablet Take 1 tablet (20 mEq total) by mouth daily.  90 tablet  3     Past Medical History  Diagnosis Date  . HTN (hypertension)   . Ventricular fibrillation     ROS:   All systems reviewed and negative except as noted in the HPI.   Past Surgical History  Procedure Date  . Cardiac defibrillator placement     medtronic     No family history on file.   History   Social History  . Marital Status: Widowed    Spouse Name: N/A    Number of Children: N/A  . Years of Education: N/A   Occupational History  . Not on file.   Social History Main Topics  . Smoking status: Former Smoker    Quit date: 10/27/1999  . Smokeless tobacco: Not on file  . Alcohol Use: Not on file  . Drug Use: Not on file  . Sexually Active: Not on file   Other Topics Concern  . Not on file   Social History Narrative  . No narrative on file     BP 201/85  Pulse 73  Ht 5' 4.5" (1.638 m)  Wt 150 lb 3.2 oz (68.13 kg)  BMI 25.38 kg/m2  Physical Exam:  Well appearing 72 yo woman, NAD HEENT: Unremarkable Neck:  No JVD, no thyromegally Lungs:  Clear with no wheezes, rales, or rhonchi. HEART:  Regular rate rhythm, no murmurs, no rubs, no clicks Abd:   soft, positive bowel sounds, no organomegally, no rebound, no guarding Ext:  2 plus pulses, no edema, no cyanosis, no clubbing Skin:  No rashes no nodules Neuro:  CN II through XII intact, motor grossly intact  EKG normal sinus rhythm with left atrial enlargement  DEVICE  Normal device function.  See PaceArt for details.   Assess/Plan:

## 2013-01-11 NOTE — Patient Instructions (Addendum)
Your physician wants you to follow-up in: 12 months with Dr Court Joy will receive a reminder letter in the mail two months in advance. If you don't receive a letter, please call our office to schedule the follow-up appointment.  Your physician has recommended you make the following change in your medication:  1) Increase Carvedilol to 25mg  1 1/2 tablets twice daily

## 2013-01-11 NOTE — Assessment & Plan Note (Signed)
Her blood pressure is elevated today. She denies dietary indiscretion. She denies medical noncompliance. We will increase her dose of carvedilol to 37.5 mg twice daily.

## 2013-01-17 ENCOUNTER — Telehealth: Payer: Self-pay | Admitting: Internal Medicine

## 2013-01-17 DIAGNOSIS — I10 Essential (primary) hypertension: Secondary | ICD-10-CM

## 2013-01-17 MED ORDER — CARVEDILOL 25 MG PO TABS: ORAL_TABLET | ORAL | Status: DC

## 2013-01-17 NOTE — Telephone Encounter (Signed)
New problem:   Carvedilol  25 mg   [facility] [address]

## 2013-01-30 ENCOUNTER — Telehealth: Payer: Self-pay | Admitting: Internal Medicine

## 2013-01-30 NOTE — Telephone Encounter (Signed)
Called pharmacy and correct the script.

## 2013-01-30 NOTE — Telephone Encounter (Signed)
[phone] Med was increased 2-3 years ago

## 2013-01-30 NOTE — Telephone Encounter (Signed)
New problem  Patient is asking to speak with nurse today.   Clarification of direction & dosage.   Patient stating take one tablet twice daily.   On file  Enalapril  10 mg.   Mail order [phone]

## 2013-04-16 ENCOUNTER — Other Ambulatory Visit: Payer: Self-pay | Admitting: Internal Medicine

## 2013-04-16 ENCOUNTER — Ambulatory Visit (INDEPENDENT_AMBULATORY_CARE_PROVIDER_SITE_OTHER): Payer: 59 | Admitting: *Deleted

## 2013-04-16 DIAGNOSIS — Z95810 Presence of automatic (implantable) cardiac defibrillator: Secondary | ICD-10-CM

## 2013-04-16 DIAGNOSIS — I4901 Ventricular fibrillation: Secondary | ICD-10-CM

## 2013-04-23 LAB — REMOTE ICD DEVICE
BRDY-0002RV: 40 {beats}/min
DEV-0020ICD: NEGATIVE
RV LEAD AMPLITUDE: 5.4 mv
TZAT-0001FASTVT: 3
TZAT-0001FASTVT: 4
TZAT-0001SLOWVT: 1
TZAT-0001SLOWVT: 4
TZAT-0001SLOWVT: 5
TZAT-0001SLOWVT: 6
TZAT-0002FASTVT: NEGATIVE
TZAT-0002FASTVT: NEGATIVE
TZAT-0002FASTVT: NEGATIVE
TZAT-0002SLOWVT: NEGATIVE
TZAT-0002SLOWVT: NEGATIVE
TZAT-0012FASTVT: 200 ms
TZAT-0012FASTVT: 200 ms
TZAT-0012FASTVT: 200 ms
TZAT-0012SLOWVT: 200 ms
TZAT-0012SLOWVT: 200 ms
TZAT-0018FASTVT: NEGATIVE
TZAT-0018FASTVT: NEGATIVE
TZAT-0018FASTVT: NEGATIVE
TZAT-0018SLOWVT: NEGATIVE
TZAT-0018SLOWVT: NEGATIVE
TZAT-0018SLOWVT: NEGATIVE
TZAT-0019FASTVT: 8 V
TZAT-0019FASTVT: 8 V
TZAT-0019FASTVT: 8 V
TZAT-0019SLOWVT: 8 V
TZAT-0019SLOWVT: 8 V
TZAT-0020FASTVT: 1.6 ms
TZAT-0020FASTVT: 1.6 ms
TZAT-0020FASTVT: 1.6 ms
TZAT-0020SLOWVT: 1.6 ms

## 2013-05-17 ENCOUNTER — Encounter: Payer: Self-pay | Admitting: *Deleted

## 2013-05-23 ENCOUNTER — Telehealth: Payer: Self-pay | Admitting: Internal Medicine

## 2013-05-23 NOTE — Telephone Encounter (Signed)
Transmission received, patient aware. 

## 2013-05-23 NOTE — Telephone Encounter (Signed)
New Prob      Pt calling in wanting to know if her transmission was received. Please call.

## 2013-06-11 ENCOUNTER — Encounter: Payer: Self-pay | Admitting: Internal Medicine

## 2013-07-30 ENCOUNTER — Encounter: Payer: Self-pay | Admitting: Internal Medicine

## 2013-07-30 ENCOUNTER — Ambulatory Visit (INDEPENDENT_AMBULATORY_CARE_PROVIDER_SITE_OTHER): Payer: 59 | Admitting: *Deleted

## 2013-07-30 DIAGNOSIS — Z95810 Presence of automatic (implantable) cardiac defibrillator: Secondary | ICD-10-CM

## 2013-07-30 DIAGNOSIS — I4901 Ventricular fibrillation: Secondary | ICD-10-CM

## 2013-08-03 LAB — REMOTE ICD DEVICE
RV LEAD IMPEDENCE ICD: 376 Ohm
TOT-0006: 20140421000000
TZAT-0001FASTVT: 1
TZAT-0001FASTVT: 2
TZAT-0001FASTVT: 6
TZAT-0001SLOWVT: 2
TZAT-0001SLOWVT: 3
TZAT-0001SLOWVT: 4
TZAT-0002FASTVT: NEGATIVE
TZAT-0002FASTVT: NEGATIVE
TZAT-0002FASTVT: NEGATIVE
TZAT-0002SLOWVT: NEGATIVE
TZAT-0002SLOWVT: NEGATIVE
TZAT-0012FASTVT: 200 ms
TZAT-0012FASTVT: 200 ms
TZAT-0012FASTVT: 200 ms
TZAT-0012SLOWVT: 200 ms
TZAT-0012SLOWVT: 200 ms
TZAT-0012SLOWVT: 200 ms
TZAT-0018FASTVT: NEGATIVE
TZAT-0018FASTVT: NEGATIVE
TZAT-0018SLOWVT: NEGATIVE
TZAT-0018SLOWVT: NEGATIVE
TZAT-0018SLOWVT: NEGATIVE
TZAT-0019FASTVT: 8 V
TZAT-0019FASTVT: 8 V
TZAT-0019FASTVT: 8 V
TZAT-0019SLOWVT: 8 V
TZAT-0019SLOWVT: 8 V
TZAT-0019SLOWVT: 8 V
TZAT-0019SLOWVT: 8 V
TZAT-0020FASTVT: 1.6 ms
TZAT-0020FASTVT: 1.6 ms
TZAT-0020FASTVT: 1.6 ms
TZAT-0020SLOWVT: 1.6 ms
TZAT-0020SLOWVT: 1.6 ms
TZAT-0020SLOWVT: 1.6 ms
TZON-0003SLOWVT: 400 ms
TZON-0004SLOWVT: 32
TZON-0005SLOWVT: 12
TZON-0008SLOWVT: 0 ms
VENTRICULAR PACING ICD: 0 pct

## 2013-08-17 ENCOUNTER — Encounter: Payer: Self-pay | Admitting: *Deleted

## 2013-10-26 ENCOUNTER — Other Ambulatory Visit: Payer: Self-pay | Admitting: Internal Medicine

## 2013-11-02 ENCOUNTER — Other Ambulatory Visit: Payer: Self-pay | Admitting: Internal Medicine

## 2013-11-05 ENCOUNTER — Ambulatory Visit (INDEPENDENT_AMBULATORY_CARE_PROVIDER_SITE_OTHER): Payer: 59 | Admitting: *Deleted

## 2013-11-05 DIAGNOSIS — I4901 Ventricular fibrillation: Secondary | ICD-10-CM

## 2013-11-05 DIAGNOSIS — Z95810 Presence of automatic (implantable) cardiac defibrillator: Secondary | ICD-10-CM

## 2013-11-06 ENCOUNTER — Encounter: Payer: Self-pay | Admitting: Internal Medicine

## 2013-11-06 ENCOUNTER — Telehealth: Payer: Self-pay | Admitting: *Deleted

## 2013-11-06 ENCOUNTER — Other Ambulatory Visit: Payer: Self-pay | Admitting: *Deleted

## 2013-11-06 DIAGNOSIS — I10 Essential (primary) hypertension: Secondary | ICD-10-CM

## 2013-11-06 MED ORDER — HYDROCHLOROTHIAZIDE 25 MG PO TABS: 25.0000 mg | ORAL_TABLET | Freq: Every day | ORAL | Status: DC

## 2013-11-06 MED ORDER — POTASSIUM CHLORIDE CRYS ER 20 MEQ PO TBCR: 20.0000 meq | EXTENDED_RELEASE_TABLET | Freq: Every day | ORAL | Status: DC

## 2013-11-06 MED ORDER — CARVEDILOL 25 MG PO TABS: ORAL_TABLET | ORAL | Status: DC

## 2013-11-06 MED ORDER — ENALAPRIL MALEATE 10 MG PO TABS: 10.0000 mg | ORAL_TABLET | Freq: Two times a day (BID) | ORAL | Status: DC

## 2013-11-06 NOTE — Telephone Encounter (Signed)
Patient called requesting refills on potassium, hctz, coreg, and enalapril. She verified with me that she was taking the enalapril bid, and said that it had been send in wrong a while back, but believes that this was a mistake on the pharmacy end.

## 2013-11-11 LAB — MDC_IDC_ENUM_SESS_TYPE_REMOTE
Brady Statistic RV Percent Paced: 0 %
HighPow Impedance: 47 Ohm
Lead Channel Impedance Value: 536 Ohm
Lead Channel Sensing Intrinsic Amplitude: 3.7 mV
Lead Channel Setting Pacing Amplitude: 2.5 V
Lead Channel Setting Pacing Pulse Width: 0.4 ms
Lead Channel Setting Sensing Sensitivity: 0.3 mV

## 2013-11-14 ENCOUNTER — Encounter: Payer: Self-pay | Admitting: *Deleted

## 2014-01-09 ENCOUNTER — Other Ambulatory Visit: Payer: Self-pay | Admitting: Internal Medicine

## 2014-01-15 ENCOUNTER — Encounter: Payer: 59 | Admitting: Internal Medicine

## 2014-02-05 ENCOUNTER — Ambulatory Visit (INDEPENDENT_AMBULATORY_CARE_PROVIDER_SITE_OTHER): Payer: 59 | Admitting: Internal Medicine

## 2014-02-05 ENCOUNTER — Encounter: Payer: Self-pay | Admitting: Internal Medicine

## 2014-02-05 VITALS — BP 168/82 | HR 62 | Ht 64.5 in | Wt 154.0 lb

## 2014-02-05 DIAGNOSIS — I4901 Ventricular fibrillation: Secondary | ICD-10-CM

## 2014-02-05 DIAGNOSIS — I10 Essential (primary) hypertension: Secondary | ICD-10-CM

## 2014-02-05 LAB — MDC_IDC_ENUM_SESS_TYPE_INCLINIC
Battery Voltage: 2.64 V
Date Time Interrogation Session: 20150210222038
HIGH POWER IMPEDANCE MEASURED VALUE: 44 Ohm
HIGH POWER IMPEDANCE MEASURED VALUE: 45 Ohm
HIGH POWER IMPEDANCE MEASURED VALUE: 45 Ohm
HIGH POWER IMPEDANCE MEASURED VALUE: 45 Ohm
HIGH POWER IMPEDANCE MEASURED VALUE: 45 Ohm
HIGH POWER IMPEDANCE MEASURED VALUE: 46 Ohm
HIGH POWER IMPEDANCE MEASURED VALUE: 46 Ohm
HIGH POWER IMPEDANCE MEASURED VALUE: 46 Ohm
HIGH POWER IMPEDANCE MEASURED VALUE: 47 Ohm
HIGH POWER IMPEDANCE MEASURED VALUE: 49 Ohm
HIGH POWER IMPEDANCE MEASURED VALUE: 49 Ohm
HIGH POWER IMPEDANCE MEASURED VALUE: 55 Ohm
HIGH POWER IMPEDANCE MEASURED VALUE: 56 Ohm
HIGH POWER IMPEDANCE MEASURED VALUE: 56 Ohm
HIGH POWER IMPEDANCE MEASURED VALUE: 57 Ohm
HIGH POWER IMPEDANCE MEASURED VALUE: 57 Ohm
HIGH POWER IMPEDANCE MEASURED VALUE: 58 Ohm
HIGH POWER IMPEDANCE MEASURED VALUE: 60 Ohm
HighPow Impedance: 45 Ohm
HighPow Impedance: 46 Ohm
HighPow Impedance: 46 Ohm
HighPow Impedance: 46 Ohm
HighPow Impedance: 46 Ohm
HighPow Impedance: 56 Ohm
HighPow Impedance: 56 Ohm
HighPow Impedance: 56 Ohm
HighPow Impedance: 57 Ohm
HighPow Impedance: 57 Ohm
HighPow Impedance: 58 Ohm
HighPow Impedance: 58 Ohm
HighPow Impedance: 60 Ohm
Lead Channel Impedance Value: 392 Ohm
Lead Channel Impedance Value: 392 Ohm
Lead Channel Impedance Value: 392 Ohm
Lead Channel Impedance Value: 392 Ohm
Lead Channel Impedance Value: 400 Ohm
Lead Channel Impedance Value: 400 Ohm
Lead Channel Impedance Value: 400 Ohm
Lead Channel Impedance Value: 400 Ohm
Lead Channel Impedance Value: 416 Ohm
Lead Channel Sensing Intrinsic Amplitude: 4 mV
Lead Channel Sensing Intrinsic Amplitude: 4.9 mV
Lead Channel Sensing Intrinsic Amplitude: 5.2 mV
Lead Channel Sensing Intrinsic Amplitude: 5.5 mV
Lead Channel Sensing Intrinsic Amplitude: 5.9 mV
Lead Channel Sensing Intrinsic Amplitude: 6.5 mV
Lead Channel Sensing Intrinsic Amplitude: 6.5 mV
Lead Channel Sensing Intrinsic Amplitude: 8.8 mV
Lead Channel Setting Pacing Amplitude: 2.5 V
Lead Channel Setting Sensing Sensitivity: 0.3 mV
MDC IDC MSMT LEADCHNL RV IMPEDANCE VALUE: 384 Ohm
MDC IDC MSMT LEADCHNL RV IMPEDANCE VALUE: 392 Ohm
MDC IDC MSMT LEADCHNL RV IMPEDANCE VALUE: 392 Ohm
MDC IDC MSMT LEADCHNL RV IMPEDANCE VALUE: 392 Ohm
MDC IDC MSMT LEADCHNL RV IMPEDANCE VALUE: 400 Ohm
MDC IDC MSMT LEADCHNL RV IMPEDANCE VALUE: 416 Ohm
MDC IDC MSMT LEADCHNL RV SENSING INTR AMPL: 3 mV
MDC IDC MSMT LEADCHNL RV SENSING INTR AMPL: 4.4 mV
MDC IDC MSMT LEADCHNL RV SENSING INTR AMPL: 4.5 mV
MDC IDC MSMT LEADCHNL RV SENSING INTR AMPL: 4.8 mV
MDC IDC MSMT LEADCHNL RV SENSING INTR AMPL: 5.1 mV
MDC IDC MSMT LEADCHNL RV SENSING INTR AMPL: 5.1 mV
MDC IDC MSMT LEADCHNL RV SENSING INTR AMPL: 5.9 mV
MDC IDC SET LEADCHNL RV PACING PULSEWIDTH: 0.4 ms
MDC IDC SET ZONE DETECTION INTERVAL: 330 ms
MDC IDC SET ZONE DETECTION INTERVAL: 400 ms
MDC IDC STAT BRADY RV PERCENT PACED: 0 %

## 2014-02-05 MED ORDER — POTASSIUM CHLORIDE CRYS ER 20 MEQ PO TBCR: 20.0000 meq | EXTENDED_RELEASE_TABLET | Freq: Every day | ORAL | Status: DC

## 2014-02-05 NOTE — Assessment & Plan Note (Signed)
Her ICD is working normally but is approaching ERI. Will plan to recheck in several months.

## 2014-02-05 NOTE — Progress Notes (Signed)
HPI April [name] returns today for followup. She is a pleasant 73 yo woman with a h/o VF arrest, s/p ICD implant and severe HTN. In the interim she has done well. She is approaching ERI. She denies any ICD shocks. No chest pain or sob. She checks her blood pressure regularly and at home it has been stable. No syncope. Allergies  Allergen Reactions  . Codeine   . Penicillins      Current Outpatient Prescriptions  Medication Sig Dispense Refill  . acetaminophen (TYLENOL) 650 MG CR tablet Take 650 mg by mouth every 8 (eight) hours as needed.        Marland Kitchen aspirin 325 MG tablet Take 325 mg by mouth daily.        . carvedilol (COREG) 25 MG tablet Take 1 and 1/2 tablets by  mouth twice daily  270 tablet  0  . enalapril (VASOTEC) 10 MG tablet Take 1 tablet by mouth two  times daily  180 tablet  0  . hydrochlorothiazide (HYDRODIURIL) 25 MG tablet Take 1 tablet by mouth  daily  90 tablet  0  . potassium chloride SA (KLOR-CON M20) 20 MEQ tablet Take 1 tablet (20 mEq total) by mouth daily.  90 tablet  3   No current facility-administered medications for this visit.     Past Medical History  Diagnosis Date  . HTN (hypertension)   . Ventricular fibrillation     ROS:   All systems reviewed and negative except as noted in the HPI.   Past Surgical History  Procedure Laterality Date  . Cardiac defibrillator placement      medtronic     No family history on file.   History   Social History  . Marital Status: Widowed    Spouse Name: N/A    Number of Children: N/A  . Years of Education: N/A   Occupational History  . Not on file.   Social History Main Topics  . Smoking status: Former Smoker    Quit date: 10/27/1999  . Smokeless tobacco: Not on file  . Alcohol Use: Not on file  . Drug Use: Not on file  . Sexual Activity: Not on file   Other Topics Concern  . Not on file   Social History Narrative  . No narrative on file     BP 168/82  Pulse 62  Ht 5' 4.5" (1.638 m)  Wt  154 lb (69.854 kg)  BMI 26.04 kg/m2  Physical Exam:  Well appearing 73 yo woman, NAD HEENT: Unremarkable Neck:  No JVD, no thyromegally Lungs:  Clear with no wheezes, rales, or rhonchi. HEART:  Regular rate rhythm, no murmurs, no rubs, no clicks Abd:  soft, positive bowel sounds, no organomegally, no rebound, no guarding Ext:  2 plus pulses, no edema, no cyanosis, no clubbing Skin:  No rashes no nodules Neuro:  CN II through XII intact, motor grossly intact  DEVICE  Normal device function.  See PaceArt for details.   Assess/Plan:

## 2014-02-05 NOTE — Patient Instructions (Addendum)
Remote monitoring is used to monitor your ICD from home. This monitoring reduces the number of office visits required to check your device to one time per year. It allows [place] to keep an eye on the functioning of your device to ensure it is working properly. You are scheduled for a device check from home on 03-07-2014. You may send your transmission at any time that day. If you have a wireless device, the transmission will be sent automatically. After your physician reviews your transmission, you will receive a postcard with your next transmission date.  Your physician recommends that you schedule a follow-up appointment in: 12 months with Dr.Taylor

## 2014-02-05 NOTE — Assessment & Plan Note (Signed)
Her blood pressure is elevated in our office as usual. She notes that at home it has been good. She will continue her current meds and maintain a low sodium diet.

## 2014-02-13 ENCOUNTER — Encounter: Payer: Self-pay | Admitting: Internal Medicine

## 2014-03-07 ENCOUNTER — Ambulatory Visit (INDEPENDENT_AMBULATORY_CARE_PROVIDER_SITE_OTHER): Payer: 59 | Admitting: *Deleted

## 2014-03-07 DIAGNOSIS — Z4502 Encounter for adjustment and management of automatic implantable cardiac defibrillator: Secondary | ICD-10-CM

## 2014-03-07 LAB — MDC_IDC_ENUM_SESS_TYPE_REMOTE
Battery Voltage: 2.64 V
Date Time Interrogation Session: 20150312102800
HighPow Impedance: 47 Ohm
Lead Channel Impedance Value: 392 Ohm
Lead Channel Setting Pacing Amplitude: 2.5 V
Lead Channel Setting Sensing Sensitivity: 0.3 mV
MDC IDC MSMT LEADCHNL RV SENSING INTR AMPL: 5.4 mV
MDC IDC SET LEADCHNL RV PACING PULSEWIDTH: 0.4 ms
MDC IDC SET ZONE DETECTION INTERVAL: 330 ms
Zone Setting Detection Interval: 400 ms

## 2014-03-19 ENCOUNTER — Encounter: Payer: Self-pay | Admitting: *Deleted

## 2014-03-25 ENCOUNTER — Telehealth: Payer: Self-pay | Admitting: Internal Medicine

## 2014-03-25 NOTE — Telephone Encounter (Signed)
New message     Pt want device clinic to know that she transmitted on 03-07-14.  She got a letter stating that we did not receive it.  Please let her know if you received her remote transmission.

## 2014-03-26 NOTE — Telephone Encounter (Signed)
Spoke w/pt and transmission was received. Pt upset that received a letter and states letter seems sarcastic.

## 2014-03-26 NOTE — Addendum Note (Signed)
Addended by: Glenda Chroman on: 03/26/2014 11:32 AM   Modules accepted: Level of Service

## 2014-03-26 NOTE — Telephone Encounter (Signed)
Follow up    Pt states that she transmitted on 03-07-14 but got a letter from [place] stating she did not transmit.  Pt want to talk to [place] only.

## 2014-03-27 ENCOUNTER — Encounter: Payer: Self-pay | Admitting: *Deleted

## 2014-04-05 ENCOUNTER — Encounter: Payer: Self-pay | Admitting: Internal Medicine

## 2014-04-08 ENCOUNTER — Ambulatory Visit (INDEPENDENT_AMBULATORY_CARE_PROVIDER_SITE_OTHER): Payer: 59 | Admitting: *Deleted

## 2014-04-08 DIAGNOSIS — I4901 Ventricular fibrillation: Secondary | ICD-10-CM

## 2014-04-08 LAB — MDC_IDC_ENUM_SESS_TYPE_REMOTE
Date Time Interrogation Session: 20150413101000
HIGH POWER IMPEDANCE MEASURED VALUE: 47 Ohm
Lead Channel Impedance Value: 392 Ohm
Lead Channel Sensing Intrinsic Amplitude: 6.6 mV
Lead Channel Setting Sensing Sensitivity: 0.3 mV
MDC IDC MSMT BATTERY VOLTAGE: 2.64 V
MDC IDC SET LEADCHNL RV PACING AMPLITUDE: 2.5 V
MDC IDC SET LEADCHNL RV PACING PULSEWIDTH: 0.4 ms
MDC IDC STAT BRADY RV PERCENT PACED: 0 %
Zone Setting Detection Interval: 330 ms
Zone Setting Detection Interval: 400 ms

## 2014-04-10 ENCOUNTER — Other Ambulatory Visit: Payer: Self-pay | Admitting: Internal Medicine

## 2014-04-19 ENCOUNTER — Other Ambulatory Visit: Payer: Self-pay | Admitting: Internal Medicine

## 2014-04-23 ENCOUNTER — Encounter: Payer: Self-pay | Admitting: *Deleted

## 2014-04-25 ENCOUNTER — Encounter: Payer: Self-pay | Admitting: Internal Medicine

## 2014-05-09 ENCOUNTER — Ambulatory Visit (INDEPENDENT_AMBULATORY_CARE_PROVIDER_SITE_OTHER): Payer: 59 | Admitting: *Deleted

## 2014-05-09 DIAGNOSIS — I4901 Ventricular fibrillation: Secondary | ICD-10-CM

## 2014-05-09 LAB — MDC_IDC_ENUM_SESS_TYPE_REMOTE
Battery Voltage: 2.64 V
Brady Statistic RV Percent Paced: 0 %
HighPow Impedance: 47 Ohm
Lead Channel Sensing Intrinsic Amplitude: 6.3 mV
Lead Channel Setting Pacing Pulse Width: 0.4 ms
Lead Channel Setting Sensing Sensitivity: 0.3 mV
MDC IDC MSMT LEADCHNL RV IMPEDANCE VALUE: 392 Ohm
MDC IDC SESS DTM: 20150514101300
MDC IDC SET LEADCHNL RV PACING AMPLITUDE: 2.5 V
MDC IDC SET ZONE DETECTION INTERVAL: 400 ms
Zone Setting Detection Interval: 330 ms

## 2014-05-09 NOTE — Progress Notes (Signed)
Remote ICD transmission.   

## 2014-05-28 ENCOUNTER — Encounter: Payer: Self-pay | Admitting: Cardiology

## 2014-06-05 ENCOUNTER — Encounter: Payer: Self-pay | Admitting: Internal Medicine

## 2014-06-10 ENCOUNTER — Ambulatory Visit (INDEPENDENT_AMBULATORY_CARE_PROVIDER_SITE_OTHER): Payer: 59 | Admitting: *Deleted

## 2014-06-10 DIAGNOSIS — Z95810 Presence of automatic (implantable) cardiac defibrillator: Secondary | ICD-10-CM

## 2014-06-10 LAB — MDC_IDC_ENUM_SESS_TYPE_REMOTE
HighPow Impedance: 45 Ohm
Lead Channel Sensing Intrinsic Amplitude: 5.6 mV
Lead Channel Setting Pacing Amplitude: 2.5 V
MDC IDC MSMT BATTERY VOLTAGE: 2.64 V
MDC IDC MSMT LEADCHNL RV IMPEDANCE VALUE: 392 Ohm
MDC IDC SET LEADCHNL RV PACING PULSEWIDTH: 0.4 ms
MDC IDC SET LEADCHNL RV SENSING SENSITIVITY: 0.3 mV
MDC IDC SET ZONE DETECTION INTERVAL: 400 ms
Zone Setting Detection Interval: 330 ms

## 2014-06-11 NOTE — Progress Notes (Signed)
Remote ICD transmission.   

## 2014-06-18 ENCOUNTER — Encounter: Payer: Self-pay | Admitting: Cardiology

## 2014-06-20 ENCOUNTER — Encounter: Payer: Self-pay | Admitting: Internal Medicine

## 2014-06-25 ENCOUNTER — Other Ambulatory Visit: Payer: Self-pay | Admitting: Internal Medicine

## 2014-07-10 ENCOUNTER — Ambulatory Visit (INDEPENDENT_AMBULATORY_CARE_PROVIDER_SITE_OTHER): Payer: 59 | Admitting: *Deleted

## 2014-07-10 DIAGNOSIS — Z4502 Encounter for adjustment and management of automatic implantable cardiac defibrillator: Secondary | ICD-10-CM

## 2014-07-10 LAB — MDC_IDC_ENUM_SESS_TYPE_REMOTE
Battery Voltage: 2.64 V
Brady Statistic RV Percent Paced: 0 %
Date Time Interrogation Session: 20150715102800
HighPow Impedance: 47 Ohm
Lead Channel Sensing Intrinsic Amplitude: 5.1 mV
Lead Channel Setting Pacing Amplitude: 2.5 V
MDC IDC MSMT LEADCHNL RV IMPEDANCE VALUE: 384 Ohm
MDC IDC SET LEADCHNL RV PACING PULSEWIDTH: 0.4 ms
MDC IDC SET LEADCHNL RV SENSING SENSITIVITY: 0.3 mV
Zone Setting Detection Interval: 330 ms
Zone Setting Detection Interval: 400 ms

## 2014-07-10 NOTE — Progress Notes (Signed)
Remote ICD transmission.   

## 2014-08-02 ENCOUNTER — Encounter: Payer: Self-pay | Admitting: Cardiology

## 2014-08-07 ENCOUNTER — Encounter: Payer: Self-pay | Admitting: Internal Medicine

## 2014-08-12 ENCOUNTER — Encounter: Payer: Self-pay | Admitting: Internal Medicine

## 2014-08-12 ENCOUNTER — Ambulatory Visit (INDEPENDENT_AMBULATORY_CARE_PROVIDER_SITE_OTHER): Payer: 59 | Admitting: *Deleted

## 2014-08-12 DIAGNOSIS — I4901 Ventricular fibrillation: Secondary | ICD-10-CM

## 2014-08-12 NOTE — Progress Notes (Signed)
Remote ICD transmission.   

## 2014-08-14 LAB — MDC_IDC_ENUM_SESS_TYPE_REMOTE
Lead Channel Sensing Intrinsic Amplitude: 5.4 mV
Lead Channel Setting Pacing Amplitude: 2.5 V
Lead Channel Setting Sensing Sensitivity: 0.3 mV
MDC IDC SET LEADCHNL RV PACING PULSEWIDTH: 0.4 ms
Zone Setting Detection Interval: 330 ms
Zone Setting Detection Interval: 400 ms

## 2014-09-05 ENCOUNTER — Other Ambulatory Visit: Payer: Self-pay | Admitting: Internal Medicine

## 2014-09-10 ENCOUNTER — Encounter: Payer: Self-pay | Admitting: Internal Medicine

## 2014-09-10 ENCOUNTER — Ambulatory Visit (INDEPENDENT_AMBULATORY_CARE_PROVIDER_SITE_OTHER): Payer: 59 | Admitting: Internal Medicine

## 2014-09-10 ENCOUNTER — Encounter: Payer: Self-pay | Admitting: *Deleted

## 2014-09-10 VITALS — BP 198/100 | HR 72 | Ht 64.5 in | Wt 152.6 lb

## 2014-09-10 DIAGNOSIS — I4901 Ventricular fibrillation: Secondary | ICD-10-CM

## 2014-09-10 DIAGNOSIS — Z4502 Encounter for adjustment and management of automatic implantable cardiac defibrillator: Secondary | ICD-10-CM

## 2014-09-10 MED ORDER — HYDROCHLOROTHIAZIDE 25 MG PO TABS: 25.0000 mg | ORAL_TABLET | Freq: Every day | ORAL | Status: DC

## 2014-09-10 NOTE — Progress Notes (Signed)
HPI April [name] returns today for followup. She is a pleasant 73 yo woman with a h/o VF arrest, s/p ICD implant and severe HTN. In the interim she has done well. She has reached ERI. She denies any ICD shocks. No chest pain or sob. She checks her blood pressure regularly and at home it has been stable with systolics typically in the 140 range. No syncope. Allergies  Allergen Reactions  . Codeine Nausea And Vomiting  . Penicillins Rash    As a child     Current Outpatient Prescriptions  Medication Sig Dispense Refill  . acetaminophen (TYLENOL) 650 MG CR tablet Take 650 mg by mouth every 8 (eight) hours as needed for pain.       Marland Kitchen aspirin 325 MG tablet Take 325 mg by mouth daily.        . carvedilol (COREG) 25 MG tablet Take 1 and 1/2 tablets by  mouth twice daily  270 tablet  0  . enalapril (VASOTEC) 10 MG tablet Take 1 tablet by mouth two  times daily  180 tablet  1  . hydrochlorothiazide (HYDRODIURIL) 25 MG tablet Take 1 tablet by mouth  daily  90 tablet  1  . potassium chloride SA (KLOR-CON M20) 20 MEQ tablet Take 1 tablet (20 mEq total) by mouth daily.  90 tablet  3   No current facility-administered medications for this visit.     Past Medical History  Diagnosis Date  . HTN (hypertension)   . Ventricular fibrillation     ROS:   All systems reviewed and negative except as noted in the HPI.   Past Surgical History  Procedure Laterality Date  . Cardiac defibrillator placement      medtronic     No family history on file.   History   Social History  . Marital Status: Widowed    Spouse Name: N/A    Number of Children: N/A  . Years of Education: N/A   Occupational History  . Not on file.   Social History Main Topics  . Smoking status: Former Smoker    Quit date: 10/27/1999  . Smokeless tobacco: Not on file  . Alcohol Use: Not on file  . Drug Use: Not on file  . Sexual Activity: Not on file   Other Topics Concern  . Not on file   Social History  Narrative  . No narrative on file     BP 198/100  Pulse 72  Ht 5' 4.5" (1.638 m)  Wt 152 lb 9.6 oz (69.219 kg)  BMI 25.80 kg/m2  Physical Exam:  Well appearing 73 yo woman, NAD HEENT: Unremarkable Neck:  No JVD, no thyromegally Lungs:  Clear with no wheezes, rales, or rhonchi. HEART:  Regular rate rhythm, no murmurs, no rubs, no clicks Abd:  soft, positive bowel sounds, no organomegally, no rebound, no guarding Ext:  2 plus pulses, no edema, no cyanosis, no clubbing Skin:  No rashes no nodules Neuro:  CN II through XII intact, motor grossly intact  DEVICE  Normal device function.  See PaceArt for details.   Assess/Plan:

## 2014-09-10 NOTE — Assessment & Plan Note (Signed)
Her device has reached ERI. Will schedule ICD generator change out in the next few weeks.

## 2014-09-10 NOTE — Assessment & Plan Note (Signed)
Her blood pressure is elevated today but she is asymptomatic. She has white coat HTN superimposed on essential HTN. She will continue her meds. I will consider uptitrating her coreg in the future.

## 2014-09-10 NOTE — Patient Instructions (Signed)
See instruction sheet for procedure 

## 2014-09-13 ENCOUNTER — Other Ambulatory Visit: Payer: Self-pay | Admitting: Internal Medicine

## 2014-10-04 ENCOUNTER — Encounter (HOSPITAL_COMMUNITY): Payer: Self-pay | Admitting: Pharmacy Technician

## 2014-10-07 ENCOUNTER — Other Ambulatory Visit (INDEPENDENT_AMBULATORY_CARE_PROVIDER_SITE_OTHER): Payer: 59 | Admitting: *Deleted

## 2014-10-07 DIAGNOSIS — I4901 Ventricular fibrillation: Secondary | ICD-10-CM

## 2014-10-07 DIAGNOSIS — Z4502 Encounter for adjustment and management of automatic implantable cardiac defibrillator: Secondary | ICD-10-CM

## 2014-10-07 LAB — CBC WITH DIFFERENTIAL/PLATELET
BASOS ABS: 0.1 10*3/uL (ref 0.0–0.1)
Basophils Relative: 1 % (ref 0.0–3.0)
EOS PCT: 1.3 % (ref 0.0–5.0)
Eosinophils Absolute: 0.1 10*3/uL (ref 0.0–0.7)
HCT: 37.8 % (ref 36.0–46.0)
Hemoglobin: 12.4 g/dL (ref 12.0–15.0)
LYMPHS PCT: 25 % (ref 12.0–46.0)
Lymphs Abs: 1.6 10*3/uL (ref 0.7–4.0)
MCHC: 32.8 g/dL (ref 30.0–36.0)
MCV: 87.6 fl (ref 78.0–100.0)
MONOS PCT: 8.9 % (ref 3.0–12.0)
Monocytes Absolute: 0.6 10*3/uL (ref 0.1–1.0)
Neutro Abs: 4.1 10*3/uL (ref 1.4–7.7)
Neutrophils Relative %: 63.8 % (ref 43.0–77.0)
PLATELETS: 215 10*3/uL (ref 150.0–400.0)
RBC: 4.32 Mil/uL (ref 3.87–5.11)
RDW: 15.3 % (ref 11.5–15.5)
WBC: 6.4 10*3/uL (ref 4.0–10.5)

## 2014-10-07 LAB — BASIC METABOLIC PANEL
BUN: 17 mg/dL (ref 6–23)
CALCIUM: 9.3 mg/dL (ref 8.4–10.5)
CO2: 29 mEq/L (ref 19–32)
Chloride: 97 mEq/L (ref 96–112)
Creatinine, Ser: 0.7 mg/dL (ref 0.4–1.2)
GFR: 87.18 mL/min (ref >60.00)
Glucose, Bld: 91 mg/dL (ref 70–99)
Potassium: 4.5 mEq/L (ref 3.5–5.1)
Sodium: 134 mEq/L — ABNORMAL LOW (ref 135–145)

## 2014-10-13 DIAGNOSIS — I10 Essential (primary) hypertension: Secondary | ICD-10-CM | POA: Diagnosis not present

## 2014-10-13 DIAGNOSIS — Z4502 Encounter for adjustment and management of automatic implantable cardiac defibrillator: Secondary | ICD-10-CM | POA: Diagnosis present

## 2014-10-13 DIAGNOSIS — Z87891 Personal history of nicotine dependence: Secondary | ICD-10-CM | POA: Diagnosis not present

## 2014-10-13 DIAGNOSIS — Z7982 Long term (current) use of aspirin: Secondary | ICD-10-CM | POA: Diagnosis not present

## 2014-10-13 MED ORDER — SODIUM CHLORIDE 0.9 % IV SOLN
INTRAVENOUS | Status: DC
  Administered 2014-10-14: 1000 mL via INTRAVENOUS

## 2014-10-13 MED ORDER — VANCOMYCIN HCL IN DEXTROSE 1-5 GM/200ML-% IV SOLN
1000.0000 mg | INTRAVENOUS | Status: DC
  Filled 2014-10-13: qty 200

## 2014-10-13 MED ORDER — SODIUM CHLORIDE 0.9 % IR SOLN
80.0000 mg | Status: DC
  Filled 2014-10-13: qty 2

## 2014-10-14 ENCOUNTER — Ambulatory Visit (HOSPITAL_COMMUNITY)
Admission: RE | Admit: 2014-10-14 | Discharge: 2014-10-14 | Disposition: A | Discharge status: Home / Self Care | Payer: 59 | Source: Ambulatory Visit | Attending: Internal Medicine | Admitting: Internal Medicine

## 2014-10-14 ENCOUNTER — Encounter (HOSPITAL_COMMUNITY)
Admission: RE | Disposition: A | Discharge status: Home / Self Care | Payer: Self-pay | Source: Ambulatory Visit | Attending: Internal Medicine

## 2014-10-14 DIAGNOSIS — Z87891 Personal history of nicotine dependence: Secondary | ICD-10-CM | POA: Insufficient documentation

## 2014-10-14 DIAGNOSIS — Z4502 Encounter for adjustment and management of automatic implantable cardiac defibrillator: Secondary | ICD-10-CM | POA: Diagnosis not present

## 2014-10-14 DIAGNOSIS — I10 Essential (primary) hypertension: Secondary | ICD-10-CM | POA: Insufficient documentation

## 2014-10-14 DIAGNOSIS — I4901 Ventricular fibrillation: Secondary | ICD-10-CM

## 2014-10-14 DIAGNOSIS — Z7982 Long term (current) use of aspirin: Secondary | ICD-10-CM | POA: Insufficient documentation

## 2014-10-14 DIAGNOSIS — I469 Cardiac arrest, cause unspecified: Secondary | ICD-10-CM

## 2014-10-14 HISTORY — PX: IMPLANTABLE CARDIOVERTER DEFIBRILLATOR (ICD) GENERATOR CHANGE: SHX5469

## 2014-10-14 LAB — SURGICAL PCR SCREEN
MRSA, PCR: NEGATIVE
STAPHYLOCOCCUS AUREUS: POSITIVE — AB

## 2014-10-14 SURGERY — ICD GENERATOR CHANGE
Anesthesia: LOCAL

## 2014-10-14 MED ORDER — ONDANSETRON HCL 4 MG/2ML IJ SOLN: 4.0000 mg | Freq: Four times a day (QID) | INTRAMUSCULAR | Status: DC | PRN

## 2014-10-14 MED ORDER — LIDOCAINE HCL (PF) 1 % IJ SOLN
INTRAMUSCULAR | Status: AC
  Filled 2014-10-14: qty 30

## 2014-10-14 MED ORDER — MUPIROCIN 2 % EX OINT
1.0000 "application " | TOPICAL_OINTMENT | Freq: Once | CUTANEOUS | Status: AC
  Administered 2014-10-14: 1 via TOPICAL
  Filled 2014-10-14: qty 22

## 2014-10-14 MED ORDER — ACETAMINOPHEN 325 MG PO TABS
325.0000 mg | ORAL_TABLET | ORAL | Status: DC | PRN
  Filled 2014-10-14: qty 2

## 2014-10-14 MED ORDER — MIDAZOLAM HCL 5 MG/5ML IJ SOLN
INTRAMUSCULAR | Status: AC
  Filled 2014-10-14: qty 5

## 2014-10-14 MED ORDER — MUPIROCIN 2 % EX OINT
TOPICAL_OINTMENT | CUTANEOUS | Status: AC
  Administered 2014-10-14: 1 via TOPICAL
  Filled 2014-10-14: qty 22

## 2014-10-14 MED ORDER — FENTANYL CITRATE 0.05 MG/ML IJ SOLN
INTRAMUSCULAR | Status: AC
  Filled 2014-10-14: qty 2

## 2014-10-14 MED ORDER — CHLORHEXIDINE GLUCONATE 4 % EX LIQD
60.0000 mL | Freq: Once | CUTANEOUS | Status: DC
  Filled 2014-10-14: qty 60

## 2014-10-14 NOTE — H&P (Signed)
  ICD Criteria  Current LVEF:55% ;Obtained > 6 months ago.   NYHA Functional Classification: Class I  Heart Failure History:  No.  Non-Ischemic Dilated Cardiomyopathy History:  No.  Atrial Fibrillation/Atrial Flutter:  No.  Ventricular Tachycardia History:  Yes, Hemodynamic instability present, VT Type:  SVT - Monomorphic.  Cardiac Arrest History:  Yes, This was a Ventricular Tachycardia/Ventricular Fibrillation Arrest. This was NOT a bradycardia arrest.  History of Syndromes with Risk of Sudden Death:  No.  Previous ICD:  Yes, ICD Type:  Single, Reason for ICD:  Secondary, reason for secondary prevention:  Cardiac Arrest.  Electrophysiology Study: No.  Prior MI: No.  PPM: No.  OSA:  No  Patient Life Expectancy of >=1 year: Yes.  Anticoagulation Therapy:  Patient is NOT on anticoagulation therapy.   Beta Blocker Therapy:  Yes.   Ace Inhibitor/ARB Therapy:  Yes.

## 2014-10-14 NOTE — CV Procedure (Signed)
Electrophysiology procedure note  Procedure: Removal of a previous implanted ICD generator, testing of the previously implanted ICD lead, insertion of a new ICD generator, with defibrillation threshold testing.  Preprocedure diagnosis: Status post ICD implantation now at elective replacement in a patient with a resuscitated prior cardiac arrest  Post procedure diagnoses: Same as preprocedure diagnosis  Description of the procedure: After informed consent was obtained, the patient was taken to the diagnostic electrophysiology laboratory in the fasting state. After the usual preparation and draping, intravenous Versed and fentanyl were used for sedation. 30 cc of lidocaine was infiltrated into the left infraclavicular region. A 6 cm incision was carried out. Electrocautery was utilized to dissect down to the ICD pocket. The ICD generator was removed with gentle traction. The ICD lead was evaluated. It was working satisfactorily. The new Medtronic single-chamber ICD, (serial number [id number]) was connected to the old ICD lead and placed back in the subcutaneous pocket. The pocket was irrigated with antibiotic irrigation. The incision was closed with 2 layers of Vicryl suture. Benzoin and Steri-Strips for pain on the skin.   At this point the patient was more deeply sedated under my direct supervision. Ventricular fibrillation was induced with a T-wave shock. A 20 J shock was delivered terminating ventricular fibrillation, and restoring sinus rhythm. No additional defibrillation threshold testing was carried out. The patient was allowed to awaken, and returned to her room in satisfactory condition. Estimated blood loss was less than 5 cc.  Complications: There were no immediate procedural complications  Conclusion: Successful removal of a previous implanted single-chamber ICD which had reached elective replacement, and insertion of a new single-chamber ICD utilizing the previously implanted ICD lead all  without immediate procedural complication in a patient with previous unexplained resuscitated cardiac arrest due to ventricular fibrillation/tachycardia.  Lewayne Bunting, M.D.

## 2014-10-14 NOTE — Discharge Instructions (Signed)
Pacemaker Battery Change, Care After °Refer to this sheet in the next few weeks. These instructions provide you with information on caring for yourself after your procedure. Your health care provider may also give you more specific instructions. Your treatment has been planned according to current medical practices, but problems sometimes occur. Call your health care provider if you have any problems or questions after your procedure. °WHAT TO EXPECT AFTER THE PROCEDURE °After your procedure, it is typical to have the following sensations: °· Soreness at the pacemaker site. °HOME CARE INSTRUCTIONS  °· Keep the incision clean and dry. °· Unless advised otherwise, you may shower beginning 48 hours after your procedure. °· For the first week after the replacement, avoid stretching motions that pull at the incision site, and avoid heavy exercise with the arm that is on the same side as the incision. °· Take medicines only as directed by your health care provider. °· Keep all follow-up visits as directed by your health care provider. °SEEK MEDICAL CARE IF:  °· You have pain at the incision site that is not relieved by over-the-counter or prescription medicine. °· There is drainage or pus from the incision site. °· There is swelling larger than a lime at the incision site. °· You develop red streaking that extends above or below the incision site. °· You feel brief, intermittent palpitations, light-headedness, or any symptoms that you feel might be related to your heart. °SEEK IMMEDIATE MEDICAL CARE IF:  °· You experience chest pain that is different than the pain at the pacemaker site. °· You experience shortness of breath. °· You have palpitations or irregular heartbeat. °· You have light-headedness that does not go away quickly. °· You faint. °· You have pain that gets worse and is not relieved by medicine. °Document Released: 10/03/2013 Document Revised: 04/29/2014 Document Reviewed: 10/03/2013 °ExitCare® Patient  Information ©2015 [employer]. This information is not intended to replace advice given to you by your health care provider. Make sure you discuss any questions you have with your health care provider. ° °

## 2014-10-14 NOTE — Interval H&P Note (Signed)
History and Physical Interval Note: No change since prior clinic visit. Will proceed with ICD generator removal and insertion of a new device.  10/14/2014 7:08 AM  April [name]  has presented today for surgery, with the diagnosis of vfib/eri  The various methods of treatment have been discussed with the patient and family. After consideration of risks, benefits and other options for treatment, the patient has consented to  Procedure(s): ICD GENERATOR CHANGE (N/A) as a surgical intervention .  The patient's history has been reviewed, patient examined, no change in status, stable for surgery.  I have reviewed the patient's chart and labs.  Questions were answered to the patient's satisfaction.     [name]

## 2014-10-14 NOTE — H&P (Signed)
HPI April [name] returns today for followup. She is a pleasant 73 yo woman with a h/o VF arrest, s/p ICD implant and with severe HTN. In the interim she has done well. She has reached ERI. She denies any ICD shocks. No chest pain or sob. She checks her blood pressure regularly and at home it has been stable with systolics typically in the 140 range. No syncope. Allergies   Allergen  Reactions   .  Codeine  Nausea And Vomiting   .  Penicillins  Rash       As a child           Current Outpatient Prescriptions   Medication  Sig  Dispense  Refill   .  acetaminophen (TYLENOL) 650 MG CR tablet  Take 650 mg by mouth every 8 (eight) hours as needed for pain.          Marland Kitchen.  aspirin 325 MG tablet  Take 325 mg by mouth daily.           .  carvedilol (COREG) 25 MG tablet  Take 1 and 1/2 tablets by  mouth twice daily   270 tablet   0   .  enalapril (VASOTEC) 10 MG tablet  Take 1 tablet by mouth two  times daily   180 tablet   1   .  hydrochlorothiazide (HYDRODIURIL) 25 MG tablet  Take 1 tablet by mouth  daily   90 tablet   1   .  potassium chloride SA (KLOR-CON M20) 20 MEQ tablet  Take 1 tablet (20 mEq total) by mouth daily.   90 tablet   3       No current facility-administered medications for this visit.           Past Medical History   Diagnosis  Date   .  HTN (hypertension)     .  Ventricular fibrillation          ROS:    All systems reviewed and negative except as noted in the HPI.      Past Surgical History   Procedure  Laterality  Date   .  Cardiac defibrillator placement           medtronic          No family history on file.      History       Social History   .  Marital Status:  Widowed       Spouse Name:  N/A       Number of Children:  N/A   .  Years of Education:  N/A       Occupational History   .  Not on file.       Social History Main Topics   .  Smoking status:  Former Smoker       Quit date:  10/27/1999   .  Smokeless tobacco:  Not on  file   .  Alcohol Use:  Not on file   .  Drug Use:  Not on file   .  Sexual Activity:  Not on file       Other Topics  Concern   .  Not on file       Social History Narrative   .  No narrative on file          BP 198/100  Pulse 72  Ht 5' 4.5" (1.638 m)  Wt 152 lb 9.6 oz (69.219 kg)  BMI 25.80  kg/m2   Physical Exam:   Well appearing 73 yo woman, NAD HEENT: Unremarkable Neck:  No JVD, no thyromegally Lungs:  Clear with no wheezes, rales, or rhonchi. HEART:  Regular rate rhythm, no murmurs, no rubs, no clicks Abd:  soft, positive bowel sounds, no organomegally, no rebound, no guarding Ext:  2 plus pulses, no edema, no cyanosis, no clubbing Skin:  No rashes no nodules Neuro:  CN II through XII intact, motor grossly intact   DEVICE   Normal device function.  Device at [facility] for details.    Assess/Plan:           Automatic implantable cardioverter-defibrillator in situ -       Her device has reached ERI. Will schedule ICD generator change out in the next few weeks.           HYPERTENSION - Her blood pressure is elevated today but she is asymptomatic. She has white coat HTN superimposed on essential HTN. She will continue her meds. I will consider uptitrating her coreg in the future.      [name]

## 2014-10-15 ENCOUNTER — Encounter (HOSPITAL_COMMUNITY): Payer: Self-pay | Admitting: *Deleted

## 2014-10-30 ENCOUNTER — Ambulatory Visit (INDEPENDENT_AMBULATORY_CARE_PROVIDER_SITE_OTHER): Payer: 59 | Admitting: *Deleted

## 2014-10-30 DIAGNOSIS — I4901 Ventricular fibrillation: Secondary | ICD-10-CM

## 2014-10-30 DIAGNOSIS — Z95810 Presence of automatic (implantable) cardiac defibrillator: Secondary | ICD-10-CM

## 2014-10-30 LAB — MDC_IDC_ENUM_SESS_TYPE_INCLINIC
Battery Voltage: 3.16 V
Brady Statistic RV Percent Paced: 0.01 %
Date Time Interrogation Session: 20151104115054
HIGH POWER IMPEDANCE MEASURED VALUE: 44 Ohm
HighPow Impedance: 0 Ohm
HighPow Impedance: 57 Ohm
Lead Channel Impedance Value: 418 Ohm
Lead Channel Pacing Threshold Amplitude: 0.625 V
Lead Channel Pacing Threshold Pulse Width: 0.4 ms
Lead Channel Sensing Intrinsic Amplitude: 7.125 mV
Lead Channel Sensing Intrinsic Amplitude: 8.125 mV
Lead Channel Setting Pacing Amplitude: 2 V
Lead Channel Setting Pacing Pulse Width: 0.4 ms
MDC IDC MSMT BATTERY REMAINING LONGEVITY: 137 mo
MDC IDC SET LEADCHNL RV SENSING SENSITIVITY: 0.3 mV
MDC IDC SET ZONE DETECTION INTERVAL: 360 ms
MDC IDC SET ZONE DETECTION INTERVAL: 450 ms
Zone Setting Detection Interval: 300 ms

## 2014-10-30 NOTE — Progress Notes (Signed)
Wound check appointment. Steri-strips removed. Wound without redness or edema. Incision edges approximated, wound well healed. Normal device function. Threshold, sensing, and impedances consistent with implant measurements. Device programmed at chronic lead settings. Histogram distribution appropriate for patient and level of activity. No ventricular arrhythmias noted. Patient educated about wound care, arm mobility, lifting restrictions, shock plan. ROV w/ Dr. Ladona Ridgel 01/29/15.

## 2014-11-08 ENCOUNTER — Other Ambulatory Visit: Payer: Self-pay | Admitting: Internal Medicine

## 2014-11-15 ENCOUNTER — Telehealth: Payer: Self-pay | Admitting: *Deleted

## 2014-11-15 NOTE — Telephone Encounter (Signed)
Pt left me message this a.m. Pt received a new 2490 Carelink. Pt was given a 24950 model at her changeout. I advised pt to continue using 50277. Pt will return extra, unopened Carelink back to [employer].

## 2014-11-18 ENCOUNTER — Other Ambulatory Visit: Payer: Self-pay

## 2014-11-18 MED ORDER — CARVEDILOL 25 MG PO TABS: 37.5000 mg | ORAL_TABLET | Freq: Two times a day (BID) | ORAL | Status: DC

## 2014-11-19 ENCOUNTER — Encounter: Payer: Self-pay | Admitting: Internal Medicine

## 2014-12-05 ENCOUNTER — Encounter (HOSPITAL_COMMUNITY): Payer: Self-pay | Admitting: Internal Medicine

## 2015-01-29 ENCOUNTER — Encounter: Payer: 59 | Admitting: Internal Medicine

## 2015-02-26 ENCOUNTER — Encounter: Payer: Medicare Other | Admitting: Internal Medicine

## 2015-03-18 ENCOUNTER — Encounter: Payer: Self-pay | Admitting: Internal Medicine

## 2015-03-18 ENCOUNTER — Ambulatory Visit (INDEPENDENT_AMBULATORY_CARE_PROVIDER_SITE_OTHER): Payer: 59 | Admitting: Internal Medicine

## 2015-03-18 DIAGNOSIS — I4901 Ventricular fibrillation: Secondary | ICD-10-CM | POA: Diagnosis not present

## 2015-03-18 DIAGNOSIS — Z95810 Presence of automatic (implantable) cardiac defibrillator: Secondary | ICD-10-CM | POA: Diagnosis not present

## 2015-03-18 LAB — MDC_IDC_ENUM_SESS_TYPE_INCLINIC
Battery Voltage: 3.12 V
HIGH POWER IMPEDANCE MEASURED VALUE: 61 Ohm
HighPow Impedance: 0 Ohm
HighPow Impedance: 47 Ohm
Lead Channel Impedance Value: 418 Ohm
Lead Channel Pacing Threshold Amplitude: 0.75 V
Lead Channel Setting Pacing Amplitude: 2 V
Lead Channel Setting Sensing Sensitivity: 0.3 mV
MDC IDC MSMT BATTERY REMAINING LONGEVITY: 136 mo
MDC IDC MSMT LEADCHNL RV PACING THRESHOLD PULSEWIDTH: 0.4 ms
MDC IDC MSMT LEADCHNL RV SENSING INTR AMPL: 10.375 mV
MDC IDC SESS DTM: 20160322165134
MDC IDC SET LEADCHNL RV PACING PULSEWIDTH: 0.4 ms
MDC IDC SET ZONE DETECTION INTERVAL: 360 ms
MDC IDC SET ZONE DETECTION INTERVAL: 450 ms
MDC IDC STAT BRADY RV PERCENT PACED: 0.01 %
Zone Setting Detection Interval: 300 ms

## 2015-03-18 MED ORDER — CARVEDILOL 25 MG PO TABS: 50.0000 mg | ORAL_TABLET | Freq: Two times a day (BID) | ORAL | Status: DC

## 2015-03-18 MED ORDER — ENALAPRIL MALEATE 10 MG PO TABS: 10.0000 mg | ORAL_TABLET | Freq: Two times a day (BID) | ORAL | Status: DC

## 2015-03-18 NOTE — Patient Instructions (Addendum)
Your physician has recommended you make the following change in your medication:   1) INCREASE Carvedilol 50 mg by mouth twice daily  Remote monitoring is used to monitor your Pacemaker of ICD from home. This monitoring reduces the number of office visits required to check your device to one time per year. It allows [place] to keep an eye on the functioning of your device to ensure it is working properly. You are scheduled for a device check from home on 06/17/2015. You may send your transmission at any time that day. If you have a wireless device, the transmission will be sent automatically. After your physician reviews your transmission, you will receive a postcard with your next transmission date.  Your physician wants you to follow-up in: 12 months with Dr. Ladona Ridgel. You will receive a reminder letter in the mail two months in advance. If you don't receive a letter, please call our office to schedule the follow-up appointment.  Your physician recommends that you schedule a follow-up appointment in: 2 weeks with Nurse visit for BP check

## 2015-03-18 NOTE — Assessment & Plan Note (Signed)
She has not had any additional episodes of VF. Will follow.

## 2015-03-18 NOTE — Progress Notes (Signed)
HPI April [name] returns today for followup. She is a pleasant 74 yo woman with a h/o VF arrest, s/p ICD implant and severe HTN but mostly white coat HTN. In the interim she has done well. She denies any ICD shocks. No chest pain or sob. She checks her blood pressure regularly and at home it has been stable with systolics typically in the 140-150 range. No syncope. Over the past 10 years her blood pressures in our office are always high.  Allergies  Allergen Reactions  . Codeine Nausea And Vomiting  . Penicillins Rash    As a child     Current Outpatient Prescriptions  Medication Sig Dispense Refill  . acetaminophen (TYLENOL) 500 MG tablet Take 500-1,000 mg by mouth daily as needed (pain).    Marland Kitchen aspirin EC 325 MG tablet Take 325 mg by mouth at bedtime.    . carvedilol (COREG) 25 MG tablet Take 1.5 tablets (37.5 mg total) by mouth 2 (two) times daily with a meal. 270 tablet 2  . diphenhydrAMINE (BENADRYL) 25 MG tablet Take 50 mg by mouth at bedtime as needed for allergies.    Marland Kitchen enalapril (VASOTEC) 10 MG tablet Take 10 mg by mouth 2 (two) times daily.    . hydrochlorothiazide (HYDRODIURIL) 25 MG tablet Take 1 tablet (25 mg total) by mouth daily. 90 tablet 3  . potassium chloride SA (K-DUR,KLOR-CON) 20 MEQ tablet Take 1 tablet by mouth  daily 90 tablet 1   No current facility-administered medications for this visit.     Past Medical History  Diagnosis Date  . HTN (hypertension)   . Ventricular fibrillation     ROS:   All systems reviewed and negative except as noted in the HPI.   Past Surgical History  Procedure Laterality Date  . Implantable cardioverter defibrillator implant  2000; 10-14-2014    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; generator change 09-2014 by Dr Ladona Ridgel  . Implantable cardioverter defibrillator (icd) generator change N/A 10/14/2014    Procedure: ICD GENERATOR CHANGE;  Surgeon: Marinus Maw, MD;  Location: [facility] CATH LAB;  Service: Cardiovascular;   Laterality: N/A;     Family History  Problem Relation Age of Onset  . Heart attack Mother   . Heart attack Father     x2  . Diabetes Maternal Aunt   . Diabetes Maternal Grandmother   . Heart Problems Maternal Grandmother      History   Social History  . Marital Status: Widowed    Spouse Name: N/A  . Number of Children: N/A  . Years of Education: N/A   Occupational History  . Not on file.   Social History Main Topics  . Smoking status: Former Smoker    Quit date: 10/27/1999  . Smokeless tobacco: Not on file  . Alcohol Use: Not on file  . Drug Use: Not on file  . Sexual Activity: Not on file   Other Topics Concern  . Not on file   Social History Narrative     BP 208/90 mmHg  Pulse 75  Ht 5' 4.5" (1.638 m)  Wt 151 lb (68.493 kg)  BMI 25.53 kg/m2  Physical Exam:  Well appearing 74 yo woman, NAD HEENT: Unremarkable Neck:  No JVD, no thyromegally Lungs:  Clear with no wheezes, rales, or rhonchi. HEART:  Regular rate rhythm, no murmurs, no rubs, no clicks Abd:  soft, positive bowel sounds, no organomegally, no rebound, no guarding Ext:  2 plus pulses, no edema,  no cyanosis, no clubbing Skin:  No rashes no nodules Neuro:  CN II through XII intact, motor grossly intact  DEVICE  Normal device function.  See PaceArt for details.   Assess/Plan:

## 2015-03-18 NOTE — Assessment & Plan Note (Signed)
Her blood pressure is elevated, 180/82 by me. We will increase her dose of coreg to 50 mg twice daily.

## 2015-03-18 NOTE — Assessment & Plan Note (Signed)
Her medtronic ICD is working normally. Will recheck in several months. 

## 2015-03-28 ENCOUNTER — Encounter: Payer: Self-pay | Admitting: Internal Medicine

## 2015-04-01 ENCOUNTER — Ambulatory Visit (INDEPENDENT_AMBULATORY_CARE_PROVIDER_SITE_OTHER): Payer: 59 | Admitting: Internal Medicine

## 2015-04-01 DIAGNOSIS — I158 Other secondary hypertension: Secondary | ICD-10-CM | POA: Diagnosis not present

## 2015-04-01 NOTE — Progress Notes (Signed)
Pt arrived for follow up BP check after increasing carvedilol to 50 mg BID// 2 weeks ago. Pt feeling well, explains she has white coat syndrome. Denies any dizziness. Last dose of carvedilol was taken today 0620.  Large cuff used on left arm, pt sat for 5 minutes. 178/70  Pulse range 70-75 164/62 136/60 (5 minutes between).

## 2015-04-01 NOTE — Patient Instructions (Signed)
Continue same medications till further notice.

## 2015-06-17 ENCOUNTER — Ambulatory Visit (INDEPENDENT_AMBULATORY_CARE_PROVIDER_SITE_OTHER): Payer: 59 | Admitting: *Deleted

## 2015-06-17 DIAGNOSIS — I4901 Ventricular fibrillation: Secondary | ICD-10-CM

## 2015-06-17 NOTE — Progress Notes (Signed)
Remote ICD transmission.   

## 2015-06-18 LAB — CUP PACEART REMOTE DEVICE CHECK
Battery Remaining Longevity: 135 mo
HighPow Impedance: 45 Ohm
HighPow Impedance: 55 Ohm
Lead Channel Pacing Threshold Amplitude: 0.75 V
Lead Channel Sensing Intrinsic Amplitude: 6.25 mV
Lead Channel Setting Pacing Pulse Width: 0.4 ms
Lead Channel Setting Sensing Sensitivity: 0.3 mV
MDC IDC MSMT BATTERY VOLTAGE: 3.08 V
MDC IDC MSMT LEADCHNL RV IMPEDANCE VALUE: 399 Ohm
MDC IDC MSMT LEADCHNL RV IMPEDANCE VALUE: 399 Ohm
MDC IDC MSMT LEADCHNL RV PACING THRESHOLD PULSEWIDTH: 0.4 ms
MDC IDC SESS DTM: 20160621041604
MDC IDC SET LEADCHNL RV PACING AMPLITUDE: 2 V
MDC IDC SET ZONE DETECTION INTERVAL: 300 ms
MDC IDC STAT BRADY RV PERCENT PACED: 0.01 %
Zone Setting Detection Interval: 360 ms
Zone Setting Detection Interval: 450 ms

## 2015-07-01 ENCOUNTER — Encounter: Payer: Self-pay | Admitting: Cardiology

## 2015-07-04 ENCOUNTER — Encounter: Payer: Self-pay | Admitting: Internal Medicine

## 2015-08-05 ENCOUNTER — Other Ambulatory Visit: Payer: Self-pay | Admitting: Internal Medicine

## 2015-08-08 ENCOUNTER — Other Ambulatory Visit: Payer: Self-pay | Admitting: *Deleted

## 2015-08-08 MED ORDER — HYDROCHLOROTHIAZIDE 25 MG PO TABS: 25.0000 mg | ORAL_TABLET | Freq: Every day | ORAL | Status: DC

## 2015-09-16 ENCOUNTER — Ambulatory Visit (INDEPENDENT_AMBULATORY_CARE_PROVIDER_SITE_OTHER): Payer: 59 | Admitting: *Deleted

## 2015-09-16 DIAGNOSIS — I4901 Ventricular fibrillation: Secondary | ICD-10-CM | POA: Diagnosis not present

## 2015-09-16 NOTE — Progress Notes (Signed)
Remote ICD transmission.   

## 2015-09-22 LAB — CUP PACEART REMOTE DEVICE CHECK
Battery Remaining Longevity: 134 mo
Brady Statistic RV Percent Paced: 0.01 %
Date Time Interrogation Session: 20160920062723
HIGH POWER IMPEDANCE MEASURED VALUE: 46 Ohm
HighPow Impedance: 62 Ohm
Lead Channel Impedance Value: 418 Ohm
Lead Channel Pacing Threshold Pulse Width: 0.4 ms
Lead Channel Setting Pacing Amplitude: 2 V
Lead Channel Setting Pacing Pulse Width: 0.4 ms
Lead Channel Setting Sensing Sensitivity: 0.3 mV
MDC IDC MSMT BATTERY VOLTAGE: 3.05 V
MDC IDC MSMT LEADCHNL RV IMPEDANCE VALUE: 418 Ohm
MDC IDC MSMT LEADCHNL RV PACING THRESHOLD AMPLITUDE: 0.625 V
MDC IDC MSMT LEADCHNL RV SENSING INTR AMPL: 7 mV
MDC IDC SET ZONE DETECTION INTERVAL: 300 ms
Zone Setting Detection Interval: 360 ms
Zone Setting Detection Interval: 450 ms

## 2015-10-07 ENCOUNTER — Encounter: Payer: Self-pay | Admitting: Cardiology

## 2015-10-16 ENCOUNTER — Encounter: Payer: Self-pay | Admitting: Internal Medicine

## 2016-03-24 ENCOUNTER — Other Ambulatory Visit: Payer: Self-pay | Admitting: Internal Medicine

## 2016-04-05 ENCOUNTER — Encounter: Payer: Self-pay | Admitting: Internal Medicine

## 2016-04-05 ENCOUNTER — Ambulatory Visit (INDEPENDENT_AMBULATORY_CARE_PROVIDER_SITE_OTHER): Payer: 59 | Admitting: Internal Medicine

## 2016-04-05 VITALS — BP 172/92 | HR 73 | Ht 64.5 in | Wt 151.0 lb

## 2016-04-05 DIAGNOSIS — Z95810 Presence of automatic (implantable) cardiac defibrillator: Secondary | ICD-10-CM | POA: Diagnosis not present

## 2016-04-05 LAB — CUP PACEART INCLINIC DEVICE CHECK
Battery Voltage: 3.03 V
Brady Statistic RV Percent Paced: 0.01 %
Date Time Interrogation Session: 20170410173708
HIGH POWER IMPEDANCE MEASURED VALUE: 45 Ohm
HIGH POWER IMPEDANCE MEASURED VALUE: 56 Ohm
Implantable Lead Implant Date: 20000821
Implantable Lead Location: 753860
Lead Channel Impedance Value: 399 Ohm
Lead Channel Impedance Value: 532 Ohm
Lead Channel Pacing Threshold Amplitude: 0.75 V
Lead Channel Pacing Threshold Pulse Width: 0.4 ms
Lead Channel Sensing Intrinsic Amplitude: 8.875 mV
Lead Channel Setting Pacing Amplitude: 2 V
MDC IDC LEAD MODEL: 6942
MDC IDC MSMT BATTERY REMAINING LONGEVITY: 130 mo
MDC IDC SET LEADCHNL RV PACING PULSEWIDTH: 0.4 ms
MDC IDC SET LEADCHNL RV SENSING SENSITIVITY: 0.3 mV

## 2016-04-05 MED ORDER — POTASSIUM CHLORIDE CRYS ER 20 MEQ PO TBCR: EXTENDED_RELEASE_TABLET | ORAL | Status: DC

## 2016-04-05 MED ORDER — HYDROCHLOROTHIAZIDE 25 MG PO TABS: ORAL_TABLET | ORAL | Status: DC

## 2016-04-05 MED ORDER — ENALAPRIL MALEATE 10 MG PO TABS: ORAL_TABLET | ORAL | Status: DC

## 2016-04-05 MED ORDER — CARVEDILOL 25 MG PO TABS: 50.0000 mg | ORAL_TABLET | Freq: Two times a day (BID) | ORAL | Status: DC

## 2016-04-05 NOTE — Patient Instructions (Signed)
Medication Instructions:  Your physician recommends that you continue on your current medications as directed. Please refer to the Current Medication list given to you today.   Labwork: None ordered   Testing/Procedures: None ordered   Follow-Up: Your physician wants you to follow-up in: 12 months with Dr Court Joy will receive a reminder letter in the mail two months in advance. If you don't receive a letter, please call our office to schedule the follow-up appointment.   Remote monitoring is used to monitor your  ICD from home. This monitoring reduces the number of office visits required to check your device to one time per year. It allows [place] to keep an eye on the functioning of your device to ensure it is working properly. You are scheduled for a device check from home on 07/05/16. You may send your transmission at any time that day. If you have a wireless device, the transmission will be sent automatically. After your physician reviews your transmission, you will receive a postcard with your next transmission date.    Any Other Special Instructions Will Be Listed Below (If Applicable).     If you need a refill on your cardiac medications before your next appointment, please call your pharmacy.

## 2016-04-05 NOTE — Progress Notes (Signed)
HPI [name] returns today for followup. She is a pleasant 75 yo woman with a h/o VF arrest, s/p ICD implant and severe HTN but mostly white coat HTN. In the interim she has done well except for a little arthritis, especially in her back. She denies any ICD shocks. No chest pain or sob. She checks her blood pressure regularly and at home it has been stable with systolics typically in the 140-150 range. No syncope. Over the past 10 years her blood pressures in our office are always high.  Allergies  Allergen Reactions  . Codeine Nausea And Vomiting  . Penicillins Rash    As a child     Current Outpatient Prescriptions  Medication Sig Dispense Refill  . acetaminophen (TYLENOL) 500 MG tablet Take 500-1,000 mg by mouth daily as needed (pain).    Marland Kitchen aspirin EC 325 MG tablet Take 325 mg by mouth at bedtime.    . carvedilol (COREG) 25 MG tablet Take 2 tablets (50 mg total) by mouth 2 (two) times daily with a meal. 360 tablet 3  . diphenhydrAMINE (BENADRYL) 25 MG tablet Take 50 mg by mouth at bedtime as needed for allergies.    Marland Kitchen enalapril (VASOTEC) 10 MG tablet Take 1 tablet by mouth two  times daily 90 tablet 3  . hydrochlorothiazide (HYDRODIURIL) 25 MG tablet Take 1 tablet by mouth  daily 90 tablet 3  . potassium chloride SA (K-DUR,KLOR-CON) 20 MEQ tablet Take 1 tablet by mouth  daily 90 tablet 3   No current facility-administered medications for this visit.     Past Medical History  Diagnosis Date  . HTN (hypertension)   . Ventricular fibrillation (HCC)     ROS:   All systems reviewed and negative except as noted in the HPI.   Past Surgical History  Procedure Laterality Date  . Implantable cardioverter defibrillator implant  2000; 10-14-2014    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; generator change 09-2014 by Dr Ladona Ridgel  . Implantable cardioverter defibrillator (icd) generator change N/A 10/14/2014    Procedure: ICD GENERATOR CHANGE;  Surgeon: Marinus Maw, MD;   Location: [facility] CATH LAB;  Service: Cardiovascular;  Laterality: N/A;     Family History  Problem Relation Age of Onset  . Heart attack Mother   . Heart attack Father     x2  . Diabetes Maternal Aunt   . Diabetes Maternal Grandmother   . Heart Problems Maternal Grandmother      Social History   Social History  . Marital Status: Widowed    Spouse Name: N/A  . Number of Children: N/A  . Years of Education: N/A   Occupational History  . Not on file.   Social History Main Topics  . Smoking status: Former Smoker    Quit date: 10/27/1999  . Smokeless tobacco: Not on file  . Alcohol Use: Not on file  . Drug Use: Not on file  . Sexual Activity: Not on file   Other Topics Concern  . Not on file   Social History Narrative     BP 172/92 mmHg  Pulse 73  Ht 5' 4.5" (1.638 m)  Wt 151 lb (68.493 kg)  BMI 25.53 kg/m2  SpO2 95%  Physical Exam:  Well appearing 75 yo woman, NAD HEENT: Unremarkable Neck:  6 cm JVD, no thyromegally Lungs:  Clear with no wheezes, rales, or rhonchi. HEART:  Regular rate rhythm, no murmurs, no rubs, no clicks Abd:  soft, positive bowel  sounds, no organomegally, no rebound, no guarding Ext:  2 plus pulses, no edema, no cyanosis, no clubbing Skin:  No rashes no nodules Neuro:  CN II through XII intact, motor grossly intact  DEVICE  Normal device function.  See PaceArt for details.   Assess/Plan:  1. VF arrest - she is stable and had no additional arrhythmias 2. HTN - her blood pressure is high. However, she notes that at home her pressure is much better.  3. ICD - her medtronic device is working normally. Will recheck in several months. She has 10 years of longevity on the device.  [name]

## 2016-07-05 ENCOUNTER — Ambulatory Visit (INDEPENDENT_AMBULATORY_CARE_PROVIDER_SITE_OTHER): Payer: 59 | Admitting: *Deleted

## 2016-07-05 DIAGNOSIS — Z95810 Presence of automatic (implantable) cardiac defibrillator: Secondary | ICD-10-CM | POA: Diagnosis not present

## 2016-07-05 DIAGNOSIS — I4901 Ventricular fibrillation: Secondary | ICD-10-CM

## 2016-07-05 NOTE — Progress Notes (Signed)
Remote ICD transmission.   

## 2016-07-06 LAB — CUP PACEART REMOTE DEVICE CHECK
Battery Voltage: 3.02 V
Brady Statistic RV Percent Paced: 0.01 %
HIGH POWER IMPEDANCE MEASURED VALUE: 42 Ohm
HighPow Impedance: 50 Ohm
Implantable Lead Model: 6942
Lead Channel Impedance Value: 399 Ohm
Lead Channel Impedance Value: 399 Ohm
Lead Channel Sensing Intrinsic Amplitude: 6.5 mV
Lead Channel Setting Pacing Pulse Width: 0.4 ms
Lead Channel Setting Sensing Sensitivity: 0.3 mV
MDC IDC LEAD IMPLANT DT: 20000821
MDC IDC LEAD LOCATION: 753860
MDC IDC MSMT BATTERY REMAINING LONGEVITY: 128 mo
MDC IDC MSMT LEADCHNL RV PACING THRESHOLD AMPLITUDE: 0.75 V
MDC IDC MSMT LEADCHNL RV PACING THRESHOLD PULSEWIDTH: 0.4 ms
MDC IDC MSMT LEADCHNL RV SENSING INTR AMPL: 6.5 mV
MDC IDC SESS DTM: 20170710082506
MDC IDC SET LEADCHNL RV PACING AMPLITUDE: 2 V

## 2016-07-07 ENCOUNTER — Encounter: Payer: Self-pay | Admitting: Cardiology

## 2016-09-28 ENCOUNTER — Other Ambulatory Visit: Payer: Self-pay | Admitting: Internal Medicine

## 2016-09-28 DIAGNOSIS — Z95810 Presence of automatic (implantable) cardiac defibrillator: Secondary | ICD-10-CM

## 2016-10-04 ENCOUNTER — Ambulatory Visit (INDEPENDENT_AMBULATORY_CARE_PROVIDER_SITE_OTHER): Payer: 59 | Admitting: *Deleted

## 2016-10-04 ENCOUNTER — Telehealth: Payer: Self-pay | Admitting: Cardiology

## 2016-10-04 DIAGNOSIS — I4901 Ventricular fibrillation: Secondary | ICD-10-CM | POA: Diagnosis not present

## 2016-10-04 NOTE — Telephone Encounter (Signed)
Spoke with pt and reminded pt of remote transmission that is due today. Pt verbalized understanding.   

## 2016-10-05 ENCOUNTER — Encounter: Payer: Self-pay | Admitting: Cardiology

## 2016-10-05 NOTE — Progress Notes (Signed)
Remote ICD transmission.   

## 2016-10-06 LAB — CUP PACEART REMOTE DEVICE CHECK
Battery Remaining Longevity: 126 mo
Battery Voltage: 3.01 V
Brady Statistic RV Percent Paced: 0.01 %
Date Time Interrogation Session: 20171010173039
HIGH POWER IMPEDANCE MEASURED VALUE: 45 Ohm
HighPow Impedance: 55 Ohm
Implantable Lead Model: 6942
Lead Channel Impedance Value: 361 Ohm
Lead Channel Impedance Value: 361 Ohm
Lead Channel Sensing Intrinsic Amplitude: 6.125 mV
Lead Channel Setting Pacing Pulse Width: 0.4 ms
Lead Channel Setting Sensing Sensitivity: 0.3 mV
MDC IDC LEAD IMPLANT DT: 20000821
MDC IDC LEAD LOCATION: 753860
MDC IDC MSMT LEADCHNL RV PACING THRESHOLD AMPLITUDE: 0.75 V
MDC IDC MSMT LEADCHNL RV PACING THRESHOLD PULSEWIDTH: 0.4 ms
MDC IDC MSMT LEADCHNL RV SENSING INTR AMPL: 6.125 mV
MDC IDC SET LEADCHNL RV PACING AMPLITUDE: 2 V

## 2016-10-07 ENCOUNTER — Other Ambulatory Visit: Payer: Self-pay | Admitting: Internal Medicine

## 2016-10-07 DIAGNOSIS — Z95810 Presence of automatic (implantable) cardiac defibrillator: Secondary | ICD-10-CM

## 2016-10-07 MED ORDER — ENALAPRIL MALEATE 10 MG PO TABS: ORAL_TABLET | ORAL | 2 refills | Status: DC

## 2016-10-07 NOTE — Telephone Encounter (Signed)
Called pt to explain that we resent her medication of enalapril 10 mg tablet taken BID. It was sent in originally dispensing 90 tablets with 3 refills. This Rx was not enough to last for the pt. I resent the Rx dispensing 180 tablets with 2 refills. Confirmation received. I advised the pt that if she has any other problems, questions or concerns to call the office. Pt verbalized understanding.

## 2016-11-24 ENCOUNTER — Ambulatory Visit (INDEPENDENT_AMBULATORY_CARE_PROVIDER_SITE_OTHER): Payer: 59 | Admitting: Orthopaedic Surgery

## 2016-11-24 ENCOUNTER — Ambulatory Visit (INDEPENDENT_AMBULATORY_CARE_PROVIDER_SITE_OTHER): Payer: Self-pay | Admitting: Orthopaedic Surgery

## 2016-11-24 ENCOUNTER — Ambulatory Visit (INDEPENDENT_AMBULATORY_CARE_PROVIDER_SITE_OTHER): Payer: 59

## 2016-11-24 ENCOUNTER — Encounter (INDEPENDENT_AMBULATORY_CARE_PROVIDER_SITE_OTHER): Payer: Self-pay | Admitting: Orthopaedic Surgery

## 2016-11-24 VITALS — Ht 64.5 in | Wt 145.0 lb

## 2016-11-24 DIAGNOSIS — M25552 Pain in left hip: Secondary | ICD-10-CM

## 2016-11-24 DIAGNOSIS — M1612 Unilateral primary osteoarthritis, left hip: Secondary | ICD-10-CM | POA: Diagnosis not present

## 2016-11-24 NOTE — Progress Notes (Signed)
Office Visit Note   Patient: April [name]           Date of Birth: [birth year]           MRN: [id number] Visit Date: 11/24/2016              Requested by: No referring provider defined for this encounter. PCP: No PCP Per Patient   Assessment & Plan: Visit Diagnoses:  1. Left hip pain   2. Pain of left hip joint   3. Unilateral primary osteoarthritis, left hip     Plan: Given the severity of her arthritis in her left hip as well as the detrimental effects this is having on her activities daily living, her quality of life, and her mobility we're recommending an anterior hip replacement surgery. I've actually perform this on her daughter before and she is aware of what this involves. I gave her handout on hip replacement surgery and we gave her x-rays and explained in detail with the surgery involves. She does ambulate with a cane and she said significant falls because of his hip. She does have a combination defibrillator pacemaker and we would like her to get cardiac clearance before surgery. Her family regarding talk about surgery and general get back to [place]. I did give him a note to give her cardiologist about providing clearance. She is only over 325 mg aspirin daily and so it increases twice a day after surgery for only about 2 weeks. Again a detailed discussion of the risk medicine surgery was had and I showed her hip model and gave her handout on hip replacement surgery as well we would do this is a direct anterior hip replacement as well.  Follow-Up Instructions: Return if symptoms worsen or fail to improve.   Orders:  Orders Placed This Encounter  Procedures  . XR HIP UNILAT W OR W/O PELVIS 2-3 VIEWS LEFT   No orders of the defined types were placed in this encounter.     Procedures: No procedures performed   Clinical Data: No additional findings.   Subjective: Chief Complaint  Patient presents with  . Left Hip - Pain    Chronic left hip pain ongoing x 2-3 years.  Lateral hip pain. Denies groin pain. Hx sciatica bilateral legs. Decreased ROM in hip. Worse with colder weather.    HPI Her pain is daily and her left hip. It's detrimentally affected her activities daily living, her quality of life, and her mobility. He can be 10 out of 10 at times. She's had significant falls. She with a cane. He is deathly in her left groin and she has limited range of motion of that left hip. She cannot cross her legs and she has problems getting her shoes on as well. Review of Systems She currently denies any headache, shortness of breath, chest pain, fever, chills, nausea, vomiting.  Objective: Vital Signs: Ht 5' 4.5" (1.638 m)   Wt 145 lb (65.8 kg)   BMI 24.50 kg/m   Physical Exam She is alert and oriented 3 and in no acute distress Ortho Exam Examination of her left hip show severe limitations of internal/external rotation of that hip. It is painful to try so as well. Her flexion extension is also limited. I can rotate her right hip little bit more left hip but it is painful as well. Her flexion-extension is easy normal right side. Specialty Comments:  No specialty comments available.  Imaging: Xr Hip Unilat W Or W/o Pelvis 2-3 Views Left  Result Date: 11/24/2016 An AP pelvis and lateral of her left hip show severe end-stage arthritis of left hip flexes severe arthritis of the right hip. The left hip has complete loss of joint space. Dissection collapse of the femoral head. There sclerotic and cystic changes in the femoral head as well as per trigger osteophytes. The right hip also has severe joint space narrowing, sclerotic changes in particular osteophytes    PMFS History: Patient Active Problem List   Diagnosis Date Noted  . 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 VENTRICULAR FIBRILLATION 02/03/2010  . Essential hypertension 01/09/2010   Past Medical History:  Diagnosis Date  . HTN (hypertension)   . Ventricular  fibrillation (HCC)     Family History  Problem Relation Age of Onset  . Heart attack Mother   . Heart attack Father     x2  . Diabetes Maternal Aunt   . Diabetes Maternal Grandmother   . Heart Problems Maternal Grandmother     Past Surgical History:  Procedure Laterality Date  . IMPLANTABLE CARDIOVERTER DEFIBRILLATOR (ICD) GENERATOR CHANGE N/A 10/14/2014   Procedure: ICD GENERATOR CHANGE;  Surgeon: April MawGregg W Taylor, MD;  [facility];  Service: Cardiovascular;  Laterality: N/A;  . IMPLANTABLE CARDIOVERTER DEFIBRILLATOR IMPLANT  2000; 10-14-2014   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; generator change 09-2014 by Dr April [name]   Social History   Occupational History  . Not on file.   Social History Main Topics  . Smoking status: Former Smoker    Quit date: 10/27/1999  . Smokeless tobacco: Not on file  . Alcohol use Not on file  . Drug use: Unknown  . Sexual activity: Not on file

## 2016-12-03 ENCOUNTER — Telehealth: Payer: Self-pay

## 2016-12-03 NOTE — Telephone Encounter (Signed)
Follow up  Pt voiced wanting to know what was on the surgical clearance that was sent over and to f/u with her.

## 2016-12-03 NOTE — Telephone Encounter (Signed)
Sent clearance for hip surgery to medical records to be faxed to [facility] [phone]. Dr. Ladona Ridgel stated "1) May proceed with surgery 2) Low risk".

## 2016-12-03 NOTE — Telephone Encounter (Signed)
Called, spoke with pt. Informed clearance faxed over. Pt verbalized understanding.

## 2017-01-04 ENCOUNTER — Ambulatory Visit (INDEPENDENT_AMBULATORY_CARE_PROVIDER_SITE_OTHER): Payer: 59 | Admitting: *Deleted

## 2017-01-04 ENCOUNTER — Ambulatory Visit (INDEPENDENT_AMBULATORY_CARE_PROVIDER_SITE_OTHER): Payer: 59 | Admitting: Orthopaedic Surgery

## 2017-01-04 DIAGNOSIS — M1611 Unilateral primary osteoarthritis, right hip: Secondary | ICD-10-CM

## 2017-01-04 DIAGNOSIS — M25552 Pain in left hip: Secondary | ICD-10-CM

## 2017-01-04 DIAGNOSIS — I4901 Ventricular fibrillation: Secondary | ICD-10-CM

## 2017-01-04 NOTE — Progress Notes (Signed)
Remote ICD transmission.   

## 2017-01-04 NOTE — Progress Notes (Signed)
The patient is well-known to me. 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 We've talked in detail in previous visits about hip replaced surgery and what this involves. I have done this on her daughter before. She is now had cardiac clearance for this surgery. She has a history of a pacemaker defibrillator. She is a low cardiac risk at this point according to her cardiologist and at this point her pain is daily, it is detrimentally affected her activities daily living and her quality of life. She does wish proceed with an anterior approach left total hip replacement. New progress she has a lot of questions that I was able to answer the office today about the surgery. We plan to do this under spinal anesthesia and at [facility]. We talked about what the intraoperative and postoperative course would be. She plans to stay with her daughter postoperative and get home health therapy. Examination of her left hip shows pain with internal rotation rotation consistent with osteoarthritis. Her x-rays also correlate with severe osteoarthritis of her left hip. She has tried and failed all forms conservative treatment at this point including rest, activity modification, walking with assistive device and a steroid injection. She is also work on strengthening exercises and work on her gait. Given the failure of these a forementioned modalities she does wish to proceed with total hip replacement surgery.  We will work on getting the surgery scheduled we would see her back in 2 weeks postoperative for for follow-up. No x-rays will be needed at that visit. We had a thorough discussion and I was able to answer all of her questions. Her daughter was present as well.

## 2017-01-05 ENCOUNTER — Encounter: Payer: Self-pay | Admitting: Cardiology

## 2017-01-06 LAB — CUP PACEART REMOTE DEVICE CHECK
Battery Voltage: 3.01 V
Brady Statistic RV Percent Paced: 0.01 %
HighPow Impedance: 47 Ohm
HighPow Impedance: 60 Ohm
Implantable Lead Location: 753860
Implantable Lead Model: 6942
Implantable Pulse Generator Implant Date: 20151019
Lead Channel Impedance Value: 399 Ohm
Lead Channel Impedance Value: 399 Ohm
Lead Channel Pacing Threshold Amplitude: 0.875 V
Lead Channel Pacing Threshold Pulse Width: 0.4 ms
Lead Channel Sensing Intrinsic Amplitude: 5 mV
Lead Channel Sensing Intrinsic Amplitude: 5 mV
Lead Channel Setting Pacing Pulse Width: 0.4 ms
Lead Channel Setting Sensing Sensitivity: 0.3 mV
MDC IDC LEAD IMPLANT DT: 20000821
MDC IDC MSMT BATTERY REMAINING LONGEVITY: 124 mo
MDC IDC SESS DTM: 20180109062404
MDC IDC SET LEADCHNL RV PACING AMPLITUDE: 2 V

## 2017-01-18 NOTE — Progress Notes (Signed)
Please place orders in EPIC as patient is being scheduled for pre-op appointment for surgery! Thank you!

## 2017-01-19 ENCOUNTER — Other Ambulatory Visit (INDEPENDENT_AMBULATORY_CARE_PROVIDER_SITE_OTHER): Payer: Self-pay | Admitting: Orthopaedic Surgery

## 2017-01-19 DIAGNOSIS — M1612 Unilateral primary osteoarthritis, left hip: Secondary | ICD-10-CM

## 2017-01-19 NOTE — Patient Instructions (Signed)
April [name]  01/19/2017   Your procedure is scheduled on: 01/28/2017    Report to [facility] Main  Entrance take [facility]  elevators to 3rd floor to  Short Stay Center at    1000 AM.  Call this number if you have problems the morning of surgery [phone]   Remember: ONLY 1 PERSON MAY GO WITH YOU TO SHORT STAY TO GET  READY MORNING OF YOUR SURGERY.  Do not eat food or drink liquids :After Midnight.     Take these medicines the morning of surgery with A SIP OF WATER: Carvedilol ( coreg)                                You may not have any metal on your body including hair pins and              piercings  Do not wear jewelry, make-up, lotions, powders or perfumes, deodorant             Do not wear nail polish.  Do not shave  48 hours prior to surgery.                Do not bring valuables to the hospital. [place] [facility] IS NOT             RESPONSIBLE   FOR VALUABLES.  Contacts, dentures or bridgework may not be worn into surgery.  Leave suitcase in the car. After surgery it may be brought to your room.                       Please read over the following fact sheets you were given: _____________________________________________________________________             [facility] - Preparing for Surgery Before surgery, you can play an important role.  Because skin is not sterile, your skin needs to be as free of germs as possible.  You can reduce the number of germs on your skin by washing with CHG (chlorahexidine gluconate) soap before surgery.  CHG is an antiseptic cleaner which kills germs and bonds with the skin to continue killing germs even after washing. Please DO NOT use if you have an allergy to CHG or antibacterial soaps.  If your skin becomes reddened/irritated stop using the CHG and inform your nurse when you arrive at Short Stay. Do not shave (including legs and underarms) for at least 48 hours prior to the first CHG shower.  You may shave your  face/neck. Please follow these instructions carefully:  1.  Shower with CHG Soap the night before surgery and the  morning of Surgery.  2.  If you choose to wash your hair, wash your hair first as usual with your  normal  shampoo.  3.  After you shampoo, rinse your hair and body thoroughly to remove the  shampoo.                           4.  Use CHG as you would any other liquid soap.  You can apply chg directly  to the skin and wash                       Gently with a scrungie or clean washcloth.  5.  Apply the CHG Soap to your body ONLY FROM THE NECK DOWN.   Do not use on face/ open                           Wound or open sores. Avoid contact with eyes, ears mouth and genitals (private parts).                       Wash face,  Genitals (private parts) with your normal soap.             6.  Wash thoroughly, paying special attention to the area where your surgery  will be performed.  7.  Thoroughly rinse your body with warm water from the neck down.  8.  DO NOT shower/wash with your normal soap after using and rinsing off  the CHG Soap.                9.  Pat yourself dry with a clean towel.            10.  Wear clean pajamas.            11.  Place clean sheets on your bed the night of your first shower and do not  sleep with pets. Day of Surgery : Do not apply any lotions/deodorants the morning of surgery.  Please wear clean clothes to the hospital/surgery center.  FAILURE TO FOLLOW THESE INSTRUCTIONS MAY RESULT IN THE CANCELLATION OF YOUR SURGERY PATIENT SIGNATURE_________________________________  NURSE SIGNATURE__________________________________  ________________________________________________________________________  WHAT IS A BLOOD TRANSFUSION? Blood Transfusion Information  A transfusion is the replacement of blood or some of its parts. Blood is made up of multiple cells which provide different functions.  Red blood cells carry oxygen and are used for blood loss  replacement.  White blood cells fight against infection.  Platelets control bleeding.  Plasma helps clot blood.  Other blood products are available for specialized needs, such as hemophilia or other clotting disorders. BEFORE THE TRANSFUSION  Who gives blood for transfusions?   Healthy volunteers who are fully evaluated to make sure their blood is safe. This is blood bank blood. Transfusion therapy is the safest it has ever been in the practice of medicine. Before blood is taken from a donor, a complete history is taken to make sure that person has no history of diseases nor engages in risky social behavior (examples are intravenous drug use or sexual activity with multiple partners). The donor's travel history is screened to minimize risk of transmitting infections, such as malaria. The donated blood is tested for signs of infectious diseases, such as HIV and hepatitis. The blood is then tested to be sure it is compatible with you in order to minimize the chance of a transfusion reaction. If you or a relative donates blood, this is often done in anticipation of surgery and is not appropriate for emergency situations. It takes many days to process the donated blood. RISKS AND COMPLICATIONS Although transfusion therapy is very safe and saves many lives, the main dangers of transfusion include:   Getting an infectious disease.  Developing a transfusion reaction. This is an allergic reaction to something in the blood you were given. Every precaution is taken to prevent this. The decision to have a blood transfusion has been considered carefully by your caregiver before blood is given. Blood is not given unless the benefits outweigh the risks. AFTER THE TRANSFUSION  Right after receiving a  blood transfusion, you will usually feel much better and more energetic. This is especially true if your red blood cells have gotten low (anemic). The transfusion raises the level of the red blood cells which  carry oxygen, and this usually causes an energy increase.  The nurse administering the transfusion will monitor you carefully for complications. HOME CARE INSTRUCTIONS  No special instructions are needed after a transfusion. You may find your energy is better. Speak with your caregiver about any limitations on activity for underlying diseases you may have. SEEK MEDICAL CARE IF:   Your condition is not improving after your transfusion.  You develop redness or irritation at the intravenous (IV) site. SEEK IMMEDIATE MEDICAL CARE IF:  Any of the following symptoms occur over the next 12 hours:  Shaking chills.  You have a temperature by mouth above 102 F (38.9 C), not controlled by medicine.  Chest, back, or muscle pain.  People around you feel you are not acting correctly or are confused.  Shortness of breath or difficulty breathing.  Dizziness and fainting.  You get a rash or develop hives.  You have a decrease in urine output.  Your urine turns a dark color or changes to pink, red, or brown. Any of the following symptoms occur over the next 10 days:  You have a temperature by mouth above 102 F (38.9 C), not controlled by medicine.  Shortness of breath.  Weakness after normal activity.  The white part of the eye turns yellow (jaundice).  You have a decrease in the amount of urine or are urinating less often.  Your urine turns a dark color or changes to pink, red, or brown. Document Released: 12/10/2000 Document Revised: 03/06/2012 Document Reviewed: 07/29/2008 [facility] Patient Information 2014 [place], [place].  _______________________________________________________________________

## 2017-01-19 NOTE — Progress Notes (Signed)
PLEASE ADD SURGICAL ORDERS IN EPIC--HAS PRE OP TOMORROW  thanks

## 2017-01-20 ENCOUNTER — Encounter (HOSPITAL_COMMUNITY): Payer: Self-pay

## 2017-01-20 ENCOUNTER — Encounter (HOSPITAL_COMMUNITY)
Admission: RE | Admit: 2017-01-20 | Discharge: 2017-01-20 | Disposition: A | Discharge status: Home / Self Care | Payer: 59 | Source: Ambulatory Visit | Attending: Orthopaedic Surgery | Admitting: Orthopaedic Surgery

## 2017-01-20 DIAGNOSIS — M1712 Unilateral primary osteoarthritis, left knee: Secondary | ICD-10-CM | POA: Insufficient documentation

## 2017-01-20 DIAGNOSIS — Z01818 Encounter for other preprocedural examination: Secondary | ICD-10-CM | POA: Diagnosis present

## 2017-01-20 HISTORY — DX: Presence of automatic (implantable) cardiac defibrillator: Z95.810

## 2017-01-20 HISTORY — DX: Unspecified osteoarthritis, unspecified site: M19.90

## 2017-01-20 HISTORY — DX: Other specified postprocedural states: Z98.890

## 2017-01-20 HISTORY — DX: Nausea with vomiting, unspecified: R11.2

## 2017-01-20 HISTORY — DX: Acute myocardial infarction, unspecified: I21.9

## 2017-01-20 LAB — BASIC METABOLIC PANEL
ANION GAP: 9 (ref 5–15)
BUN: 26 mg/dL — ABNORMAL HIGH (ref 6–20)
CHLORIDE: 105 mmol/L (ref 101–111)
CO2: 24 mmol/L (ref 22–32)
Calcium: 9.3 mg/dL (ref 8.9–10.3)
Creatinine, Ser: 0.69 mg/dL (ref 0.44–1.00)
GFR calc Af Amer: >60 mL/min (ref >60)
GLUCOSE: 112 mg/dL — AB (ref 65–99)
POTASSIUM: 4.6 mmol/L (ref 3.5–5.1)
Sodium: 138 mmol/L (ref 135–145)

## 2017-01-20 LAB — CBC
HEMATOCRIT: 35.6 % — AB (ref 36.0–46.0)
HEMOGLOBIN: 11.3 g/dL — AB (ref 12.0–15.0)
MCH: 28.7 pg (ref 26.0–34.0)
MCHC: 31.7 g/dL (ref 30.0–36.0)
MCV: 90.4 fL (ref 78.0–100.0)
Platelets: 241 10*3/uL (ref 150–400)
RBC: 3.94 MIL/uL (ref 3.87–5.11)
RDW: 15.1 % (ref 11.5–15.5)
WBC: 7.4 10*3/uL (ref 4.0–10.5)

## 2017-01-20 LAB — SURGICAL PCR SCREEN
MRSA, PCR: NEGATIVE
Staphylococcus aureus: POSITIVE — AB

## 2017-01-20 LAB — ABO/RH: ABO/RH(D): B POS

## 2017-01-20 NOTE — Progress Notes (Signed)
EKG-03/2016- on chart and in epic.   01/04/2017- last device check- epic  Clearance- Dr Lewayne Bunting on chart - 12/02/2016

## 2017-01-20 NOTE — Progress Notes (Signed)
BMP done 01/20/2017 faxed via EPIC to Dr Allie Bossier.

## 2017-01-24 ENCOUNTER — Telehealth (INDEPENDENT_AMBULATORY_CARE_PROVIDER_SITE_OTHER): Payer: Self-pay | Admitting: Orthopaedic Surgery

## 2017-01-24 NOTE — Telephone Encounter (Signed)
Please advise 

## 2017-01-24 NOTE — Telephone Encounter (Signed)
She can stop her aspirin tomorrow

## 2017-01-24 NOTE — Telephone Encounter (Signed)
Patient aware of the below message from Dr. Blackman  

## 2017-01-24 NOTE — Telephone Encounter (Signed)
Pt wants to know when she should stop her heart aspirin before her surgery Friday.  [phone]

## 2017-01-28 ENCOUNTER — Inpatient Hospital Stay (HOSPITAL_COMMUNITY): Payer: 59

## 2017-01-28 ENCOUNTER — Encounter (HOSPITAL_COMMUNITY): Payer: Self-pay | Admitting: *Deleted

## 2017-01-28 ENCOUNTER — Encounter (HOSPITAL_COMMUNITY)
Admission: RE | Disposition: A | Discharge status: Home Health | Payer: Self-pay | Source: Ambulatory Visit | Attending: Orthopaedic Surgery

## 2017-01-28 ENCOUNTER — Inpatient Hospital Stay (HOSPITAL_COMMUNITY): Payer: 59 | Admitting: Certified Registered Nurse Anesthetist

## 2017-01-28 ENCOUNTER — Inpatient Hospital Stay (HOSPITAL_COMMUNITY)
Admission: RE | Admit: 2017-01-28 | Discharge: 2017-01-30 | DRG: 470 | Disposition: A | Discharge status: Home Health | Payer: 59 | Source: Ambulatory Visit | Attending: Orthopaedic Surgery | Admitting: Orthopaedic Surgery

## 2017-01-28 DIAGNOSIS — Z471 Aftercare following joint replacement surgery: Secondary | ICD-10-CM | POA: Diagnosis not present

## 2017-01-28 DIAGNOSIS — M1612 Unilateral primary osteoarthritis, left hip: Secondary | ICD-10-CM | POA: Diagnosis not present

## 2017-01-28 DIAGNOSIS — Z880 Allergy status to penicillin: Secondary | ICD-10-CM

## 2017-01-28 DIAGNOSIS — M1712 Unilateral primary osteoarthritis, left knee: Secondary | ICD-10-CM | POA: Diagnosis not present

## 2017-01-28 DIAGNOSIS — Z95810 Presence of automatic (implantable) cardiac defibrillator: Secondary | ICD-10-CM | POA: Diagnosis not present

## 2017-01-28 DIAGNOSIS — I252 Old myocardial infarction: Secondary | ICD-10-CM | POA: Diagnosis not present

## 2017-01-28 DIAGNOSIS — I10 Essential (primary) hypertension: Secondary | ICD-10-CM | POA: Diagnosis present

## 2017-01-28 DIAGNOSIS — Z96642 Presence of left artificial hip joint: Secondary | ICD-10-CM | POA: Diagnosis not present

## 2017-01-28 DIAGNOSIS — Z87891 Personal history of nicotine dependence: Secondary | ICD-10-CM | POA: Diagnosis not present

## 2017-01-28 DIAGNOSIS — Z8249 Family history of ischemic heart disease and other diseases of the circulatory system: Secondary | ICD-10-CM | POA: Diagnosis not present

## 2017-01-28 DIAGNOSIS — I4901 Ventricular fibrillation: Secondary | ICD-10-CM | POA: Diagnosis not present

## 2017-01-28 DIAGNOSIS — Z885 Allergy status to narcotic agent status: Secondary | ICD-10-CM

## 2017-01-28 DIAGNOSIS — M25552 Pain in left hip: Secondary | ICD-10-CM | POA: Diagnosis not present

## 2017-01-28 HISTORY — PX: TOTAL HIP ARTHROPLASTY: SHX124

## 2017-01-28 LAB — TYPE AND SCREEN
ABO/RH(D): B POS
Antibody Screen: NEGATIVE

## 2017-01-28 SURGERY — ARTHROPLASTY, HIP, TOTAL, ANTERIOR APPROACH
Anesthesia: Monitor Anesthesia Care | Site: Hip | Laterality: Left

## 2017-01-28 MED ORDER — FENTANYL CITRATE (PF) 100 MCG/2ML IJ SOLN
INTRAMUSCULAR | Status: AC
  Filled 2017-01-28: qty 2

## 2017-01-28 MED ORDER — SODIUM CHLORIDE 0.9 % IR SOLN
Status: DC | PRN
  Administered 2017-01-28: 1000 mL

## 2017-01-28 MED ORDER — METOCLOPRAMIDE HCL 5 MG/ML IJ SOLN: 5.0000 mg | Freq: Three times a day (TID) | INTRAMUSCULAR | Status: DC | PRN

## 2017-01-28 MED ORDER — DIPHENHYDRAMINE HCL 12.5 MG/5ML PO ELIX: 12.5000 mg | ORAL_SOLUTION | ORAL | Status: DC | PRN

## 2017-01-28 MED ORDER — POTASSIUM CHLORIDE CRYS ER 20 MEQ PO TBCR
20.0000 meq | EXTENDED_RELEASE_TABLET | Freq: Every evening | ORAL | Status: DC
  Administered 2017-01-28 – 2017-01-29 (×2): 20 meq via ORAL
  Filled 2017-01-28 (×2): qty 1

## 2017-01-28 MED ORDER — PHENYLEPHRINE 40 MCG/ML (10ML) SYRINGE FOR IV PUSH (FOR BLOOD PRESSURE SUPPORT)
PREFILLED_SYRINGE | INTRAVENOUS | Status: AC
  Filled 2017-01-28: qty 10

## 2017-01-28 MED ORDER — CARVEDILOL 25 MG PO TABS
50.0000 mg | ORAL_TABLET | Freq: Two times a day (BID) | ORAL | Status: DC
  Administered 2017-01-28 – 2017-01-30 (×4): 50 mg via ORAL
  Filled 2017-01-28 (×4): qty 2

## 2017-01-28 MED ORDER — CLINDAMYCIN PHOSPHATE 900 MG/50ML IV SOLN
900.0000 mg | INTRAVENOUS | Status: AC
  Administered 2017-01-28: 900 mg via INTRAVENOUS

## 2017-01-28 MED ORDER — ONDANSETRON HCL 4 MG/2ML IJ SOLN
INTRAMUSCULAR | Status: AC
  Filled 2017-01-28: qty 2

## 2017-01-28 MED ORDER — ONDANSETRON HCL 4 MG/2ML IJ SOLN: 4.0000 mg | Freq: Once | INTRAMUSCULAR | Status: DC | PRN

## 2017-01-28 MED ORDER — DEXAMETHASONE SODIUM PHOSPHATE 10 MG/ML IJ SOLN
INTRAMUSCULAR | Status: DC | PRN
  Administered 2017-01-28: 10 mg via INTRAVENOUS

## 2017-01-28 MED ORDER — ALUM & MAG HYDROXIDE-SIMETH 200-200-20 MG/5ML PO SUSP: 30.0000 mL | ORAL | Status: DC | PRN

## 2017-01-28 MED ORDER — HYDROMORPHONE HCL 1 MG/ML IJ SOLN
0.2500 mg | INTRAMUSCULAR | Status: DC | PRN
  Administered 2017-01-28: 0.5 mg via INTRAVENOUS

## 2017-01-28 MED ORDER — SODIUM CHLORIDE 0.9 % IV SOLN
INTRAVENOUS | Status: DC
  Administered 2017-01-28 – 2017-01-29 (×2): via INTRAVENOUS

## 2017-01-28 MED ORDER — STERILE WATER FOR IRRIGATION IR SOLN
Status: DC | PRN
  Administered 2017-01-28: 2000 mL

## 2017-01-28 MED ORDER — PHENYLEPHRINE 40 MCG/ML (10ML) SYRINGE FOR IV PUSH (FOR BLOOD PRESSURE SUPPORT)
PREFILLED_SYRINGE | INTRAVENOUS | Status: DC | PRN
  Administered 2017-01-28 (×4): 80 ug via INTRAVENOUS
  Administered 2017-01-28: 40 ug via INTRAVENOUS
  Administered 2017-01-28 (×2): 80 ug via INTRAVENOUS
  Administered 2017-01-28: 40 ug via INTRAVENOUS
  Administered 2017-01-28 (×4): 80 ug via INTRAVENOUS

## 2017-01-28 MED ORDER — HYDROCHLOROTHIAZIDE 25 MG PO TABS
25.0000 mg | ORAL_TABLET | Freq: Every day | ORAL | Status: DC
  Administered 2017-01-29 – 2017-01-30 (×2): 25 mg via ORAL
  Filled 2017-01-28 (×2): qty 1

## 2017-01-28 MED ORDER — CLINDAMYCIN PHOSPHATE 900 MG/50ML IV SOLN
INTRAVENOUS | Status: AC
  Filled 2017-01-28: qty 50

## 2017-01-28 MED ORDER — ZOLPIDEM TARTRATE 5 MG PO TABS: 5.0000 mg | ORAL_TABLET | Freq: Every evening | ORAL | Status: DC | PRN

## 2017-01-28 MED ORDER — BUPIVACAINE HCL (PF) 0.5 % IJ SOLN
INTRAMUSCULAR | Status: DC | PRN
  Administered 2017-01-28: 3 mL

## 2017-01-28 MED ORDER — DOCUSATE SODIUM 100 MG PO CAPS
100.0000 mg | ORAL_CAPSULE | Freq: Two times a day (BID) | ORAL | Status: DC
  Administered 2017-01-28 – 2017-01-29 (×3): 100 mg via ORAL
  Filled 2017-01-28 (×4): qty 1

## 2017-01-28 MED ORDER — MEPERIDINE HCL 50 MG/ML IJ SOLN: 6.2500 mg | INTRAMUSCULAR | Status: DC | PRN

## 2017-01-28 MED ORDER — METOCLOPRAMIDE HCL 5 MG PO TABS
5.0000 mg | ORAL_TABLET | Freq: Three times a day (TID) | ORAL | Status: DC | PRN
  Administered 2017-01-28: 21:00:00 5 mg via ORAL
  Filled 2017-01-28: qty 1

## 2017-01-28 MED ORDER — PROPOFOL 500 MG/50ML IV EMUL
INTRAVENOUS | Status: DC | PRN
  Administered 2017-01-28: 50 ug/kg/min via INTRAVENOUS

## 2017-01-28 MED ORDER — ASPIRIN EC 325 MG PO TBEC
325.0000 mg | DELAYED_RELEASE_TABLET | Freq: Two times a day (BID) | ORAL | Status: DC
  Administered 2017-01-28 – 2017-01-30 (×4): 325 mg via ORAL
  Filled 2017-01-28 (×4): qty 1

## 2017-01-28 MED ORDER — ACETAMINOPHEN 325 MG PO TABS: 650.0000 mg | ORAL_TABLET | Freq: Four times a day (QID) | ORAL | Status: DC | PRN

## 2017-01-28 MED ORDER — FENTANYL CITRATE (PF) 100 MCG/2ML IJ SOLN
INTRAMUSCULAR | Status: DC | PRN
  Administered 2017-01-28: 50 ug via INTRAVENOUS

## 2017-01-28 MED ORDER — PROPOFOL 10 MG/ML IV BOLUS
INTRAVENOUS | Status: DC | PRN
  Administered 2017-01-28 (×3): 10 mg via INTRAVENOUS

## 2017-01-28 MED ORDER — DEXAMETHASONE SODIUM PHOSPHATE 10 MG/ML IJ SOLN
INTRAMUSCULAR | Status: AC
  Filled 2017-01-28: qty 1

## 2017-01-28 MED ORDER — HYDROMORPHONE HCL 1 MG/ML IJ SOLN: 0.5000 mg | INTRAMUSCULAR | Status: DC | PRN

## 2017-01-28 MED ORDER — OXYCODONE HCL 5 MG PO TABS
5.0000 mg | ORAL_TABLET | ORAL | Status: DC | PRN
  Administered 2017-01-28 – 2017-01-30 (×9): 5 mg via ORAL
  Filled 2017-01-28 (×8): qty 1
  Filled 2017-01-28: qty 2

## 2017-01-28 MED ORDER — PROPOFOL 10 MG/ML IV BOLUS
INTRAVENOUS | Status: AC
  Filled 2017-01-28: qty 60

## 2017-01-28 MED ORDER — PHENOL 1.4 % MT LIQD: 1.0000 | OROMUCOSAL | Status: DC | PRN

## 2017-01-28 MED ORDER — MIDAZOLAM HCL 5 MG/5ML IJ SOLN
INTRAMUSCULAR | Status: DC | PRN
  Administered 2017-01-28: 1 mg via INTRAVENOUS

## 2017-01-28 MED ORDER — ONDANSETRON HCL 4 MG/2ML IJ SOLN
INTRAMUSCULAR | Status: DC | PRN
  Administered 2017-01-28: 4 mg via INTRAVENOUS

## 2017-01-28 MED ORDER — HYDROMORPHONE HCL 1 MG/ML IJ SOLN
INTRAMUSCULAR | Status: AC
  Filled 2017-01-28: qty 1

## 2017-01-28 MED ORDER — MENTHOL 3 MG MT LOZG: 1.0000 | LOZENGE | OROMUCOSAL | Status: DC | PRN

## 2017-01-28 MED ORDER — TRANEXAMIC ACID 1000 MG/10ML IV SOLN
1000.0000 mg | INTRAVENOUS | Status: AC
  Administered 2017-01-28: 1000 mg via INTRAVENOUS
  Filled 2017-01-28: qty 1100

## 2017-01-28 MED ORDER — METHOCARBAMOL 500 MG PO TABS
500.0000 mg | ORAL_TABLET | Freq: Four times a day (QID) | ORAL | Status: DC | PRN
  Administered 2017-01-30 (×2): 500 mg via ORAL
  Filled 2017-01-28 (×2): qty 1

## 2017-01-28 MED ORDER — LACTATED RINGERS IV SOLN
INTRAVENOUS | Status: DC
  Administered 2017-01-28: 1000 mL via INTRAVENOUS
  Administered 2017-01-28 (×2): via INTRAVENOUS

## 2017-01-28 MED ORDER — ONDANSETRON HCL 4 MG PO TABS: 4.0000 mg | ORAL_TABLET | Freq: Four times a day (QID) | ORAL | Status: DC | PRN

## 2017-01-28 MED ORDER — MIDAZOLAM HCL 2 MG/2ML IJ SOLN
INTRAMUSCULAR | Status: AC
  Filled 2017-01-28: qty 2

## 2017-01-28 MED ORDER — ACETAMINOPHEN 650 MG RE SUPP: 650.0000 mg | Freq: Four times a day (QID) | RECTAL | Status: DC | PRN

## 2017-01-28 MED ORDER — CLINDAMYCIN PHOSPHATE 600 MG/50ML IV SOLN
600.0000 mg | Freq: Four times a day (QID) | INTRAVENOUS | Status: AC
  Administered 2017-01-28 (×2): 600 mg via INTRAVENOUS
  Filled 2017-01-28 (×2): qty 50

## 2017-01-28 MED ORDER — BUPIVACAINE HCL (PF) 0.5 % IJ SOLN
INTRAMUSCULAR | Status: AC
  Filled 2017-01-28: qty 30

## 2017-01-28 MED ORDER — ONDANSETRON HCL 4 MG/2ML IJ SOLN
4.0000 mg | Freq: Four times a day (QID) | INTRAMUSCULAR | Status: DC | PRN
  Administered 2017-01-28 – 2017-01-29 (×2): 4 mg via INTRAVENOUS
  Filled 2017-01-28 (×2): qty 2

## 2017-01-28 MED ORDER — METHOCARBAMOL 1000 MG/10ML IJ SOLN
500.0000 mg | Freq: Four times a day (QID) | INTRAVENOUS | Status: DC | PRN
  Administered 2017-01-28: 500 mg via INTRAVENOUS
  Filled 2017-01-28: qty 5
  Filled 2017-01-28: qty 550

## 2017-01-28 SURGICAL SUPPLY — 37 items
BAG ZIPLOCK 12X15 (MISCELLANEOUS) IMPLANT
BENZOIN TINCTURE PRP APPL 2/3 (GAUZE/BANDAGES/DRESSINGS) ×3 IMPLANT
BLADE SAW SGTL 18X1.27X75 (BLADE) ×2 IMPLANT
BLADE SAW SGTL 18X1.27X75MM (BLADE) ×1
CAPT HIP TOTAL 2 ×3 IMPLANT
CELLS DAT CNTRL 66122 CELL SVR (MISCELLANEOUS) ×1 IMPLANT
CLOSURE WOUND 1/2 X4 (GAUZE/BANDAGES/DRESSINGS) ×1
CLOTH BEACON ORANGE TIMEOUT ST (SAFETY) ×3 IMPLANT
COVER PERINEAL POST (MISCELLANEOUS) ×3 IMPLANT
DRAPE STERI IOBAN 125X83 (DRAPES) ×3 IMPLANT
DRAPE U-SHAPE 47X51 STRL (DRAPES) ×6 IMPLANT
DRSG AQUACEL AG ADV 3.5X10 (GAUZE/BANDAGES/DRESSINGS) ×3 IMPLANT
DURAPREP 26ML APPLICATOR (WOUND CARE) ×3 IMPLANT
ELECT REM PT RETURN 9FT ADLT (ELECTROSURGICAL) ×3
ELECTRODE REM PT RTRN 9FT ADLT (ELECTROSURGICAL) ×1 IMPLANT
GAUZE XEROFORM 1X8 LF (GAUZE/BANDAGES/DRESSINGS) IMPLANT
GLOVE BIO SURGEON STRL SZ7.5 (GLOVE) ×3 IMPLANT
GLOVE BIOGEL PI IND STRL 8 (GLOVE) ×2 IMPLANT
GLOVE BIOGEL PI INDICATOR 8 (GLOVE) ×4
GLOVE ECLIPSE 8.0 STRL XLNG CF (GLOVE) ×3 IMPLANT
GOWN STRL REUS W/TWL XL LVL3 (GOWN DISPOSABLE) ×6 IMPLANT
HANDPIECE INTERPULSE COAX TIP (DISPOSABLE) ×2
HOLDER FOLEY CATH W/STRAP (MISCELLANEOUS) ×3 IMPLANT
PACK ANTERIOR HIP CUSTOM (KITS) ×3 IMPLANT
RTRCTR WOUND ALEXIS 18CM MED (MISCELLANEOUS) ×3
SET HNDPC FAN SPRY TIP SCT (DISPOSABLE) ×1 IMPLANT
STAPLER VISISTAT 35W (STAPLE) IMPLANT
STRIP CLOSURE SKIN 1/2X4 (GAUZE/BANDAGES/DRESSINGS) ×2 IMPLANT
SUT ETHIBOND NAB CT1 #1 30IN (SUTURE) ×3 IMPLANT
SUT MNCRL AB 4-0 PS2 18 (SUTURE) ×3 IMPLANT
SUT VIC AB 0 CT1 36 (SUTURE) ×3 IMPLANT
SUT VIC AB 1 CT1 36 (SUTURE) ×3 IMPLANT
SUT VIC AB 2-0 CT1 27 (SUTURE) ×4
SUT VIC AB 2-0 CT1 TAPERPNT 27 (SUTURE) ×2 IMPLANT
TRAY FOLEY CATH 14FRSI W/METER (CATHETERS) ×3 IMPLANT
TRAY FOLEY W/METER SILVER 16FR (SET/KITS/TRAYS/PACK) IMPLANT
YANKAUER SUCT BULB TIP 10FT TU (MISCELLANEOUS) ×3 IMPLANT

## 2017-01-28 NOTE — Transfer of Care (Signed)
Immediate Anesthesia Transfer of Care Note  Patient: April [name]  Procedure(s) Performed: Procedure(s): LEFT TOTAL HIP ARTHROPLASTY ANTERIOR APPROACH (Left)  Patient Location: PACU  Anesthesia Type:Spinal  Level of Consciousness:  sedated, patient cooperative and responds to stimulation  Airway & Oxygen Therapy:Patient Spontanous Breathing and Patient connected to face mask oxgen  Post-op Assessment:  Report given to PACU RN and Post -op Vital signs reviewed and stable  Post vital signs:  Reviewed and stable  Last Vitals:  Vitals:   01/28/17 0941  BP: (!) 166/76  Pulse: 65  Resp: 18  Temp: 36.8 C    Complications: No apparent anesthesia complications

## 2017-01-28 NOTE — Brief Op Note (Signed)
01/28/2017  1:00 PM  PATIENT:  April [name]  76 y.o. female  PRE-OPERATIVE DIAGNOSIS:  osteoarthritis left hip  POST-OPERATIVE DIAGNOSIS:  osteoarthritis left hip  PROCEDURE:  Procedure(s): LEFT TOTAL HIP ARTHROPLASTY ANTERIOR APPROACH (Left)  SURGEON:  Surgeon(s) and Role:    * Kathryne Hitch, MD - Primary  PHYSICIAN ASSISTANT: Rexene Edison, PA-C  ANESTHESIA:   spinal  EBL:  Total I/O In: 1000 [I.V.:1000] Out: 400 [Urine:150; Blood:250]  COUNTS:  YES  DICTATION: .Other Dictation: Dictation Number [id number]  PLAN OF CARE: Admit to inpatient   PATIENT DISPOSITION:  PACU - hemodynamically stable.   Delay start of Pharmacological VTE agent (>24hrs) due to surgical blood loss or risk of bleeding: no

## 2017-01-28 NOTE — H&P (Signed)
TOTAL HIP ADMISSION H&P  Patient is admitted for left total hip arthroplasty.  Subjective:  Chief Complaint: left hip pain  HPI: April [name], 76 y.o. female, has a history of pain and functional disability in the left hip(s) due to arthritis and patient has failed non-surgical conservative treatments for greater than 12 weeks to include NSAID's and/or analgesics, corticosteriod injections, flexibility and strengthening excercises, use of assistive devices and activity modification.  Onset of symptoms was gradual starting 3 years ago with gradually worsening course since that time.The patient noted no past surgery on the left hip(s).  Patient currently rates pain in the left hip at 10 out of 10 with activity. Patient has night pain, worsening of pain with activity and weight bearing, trendelenberg gait, pain that interfers with activities of daily living, pain with passive range of motion and crepitus. Patient has evidence of subchondral cysts, subchondral sclerosis, periarticular osteophytes and joint space narrowing by imaging studies. This condition presents safety issues increasing the risk of falls.  There is no current active infection.  Patient Active Problem List   Diagnosis Date Noted  . Unilateral primary osteoarthritis, left hip 01/28/2017  . 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 VENTRICULAR FIBRILLATION 02/03/2010  . Essential hypertension 01/09/2010   Past Medical History:  Diagnosis Date  . AICD (automatic cardioverter/defibrillator) present   . Arthritis   . HTN (hypertension)   . Myocardial infarction    mild Mi 2000  . PONV (postoperative nausea and vomiting)   . Ventricular fibrillation [facility])     Past Surgical History:  Procedure Laterality Date  . IMPLANTABLE CARDIOVERTER DEFIBRILLATOR (ICD) GENERATOR CHANGE N/A 10/14/2014   Procedure: ICD GENERATOR CHANGE;  Surgeon: Marinus Maw, MD;  Location: [facility] CATH LAB;  Service: Cardiovascular;   Laterality: N/A;  . IMPLANTABLE CARDIOVERTER DEFIBRILLATOR IMPLANT  2000; 10-14-2014   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; generator change 09-2014 by Dr Ladona Ridgel    No prescriptions prior to admission.   Allergies  Allergen Reactions  . Codeine Nausea And Vomiting  . Penicillins Hives and Rash    As a child Has patient had a PCN reaction causing immediate rash, facial/tongue/throat swelling, SOB or lightheadedness with hypotension:unsure Has patient had a PCN reaction causing severe rash involving mucus membranes or skin necrosis:unsure Has patient had a PCN reaction that required hospitalization:No Has patient had a PCN reaction occurring within the last 10 years:No If all of the above answers are "NO", then may proceed with Cephalosporin use.     Social History  Substance Use Topics  . Smoking status: Former Smoker    Quit date: 10/27/1999  . Smokeless tobacco: Never Used  . Alcohol use No    Family History  Problem Relation Age of Onset  . Heart attack Mother   . Heart attack Father     x2  . Diabetes Maternal Aunt   . Diabetes Maternal Grandmother   . Heart Problems Maternal Grandmother      Review of Systems  Musculoskeletal: Positive for joint pain.  All other systems reviewed and are negative.   Objective:  Physical Exam  Constitutional: She is oriented to person, place, and time. She appears well-developed and well-nourished.  HENT:  Head: Normocephalic and atraumatic.  Eyes: EOM are normal. Pupils are equal, round, and reactive to light.  Neck: Normal range of motion. Neck supple.  Cardiovascular: Normal rate and regular rhythm.   Respiratory: Effort normal and breath sounds normal.  GI: Soft. Bowel  sounds are normal.  Musculoskeletal:       Left hip: She exhibits decreased range of motion, decreased strength, tenderness and bony tenderness.  Neurological: She is alert and oriented to person, place, and time.  Skin: Skin is warm and dry.   Psychiatric: She has a normal mood and affect.    Vital signs in last 24 hours:    Labs:   Estimated body mass index is 24.84 kg/m as calculated from the following:   Height as of 01/20/17: 5' 4.5" (1.638 m).   Weight as of 01/20/17: 147 lb (66.7 kg).   Imaging Review Plain radiographs demonstrate severe degenerative joint disease of the left hip(s). The bone quality appears to be good for age and reported activity level.  Assessment/Plan:  End stage arthritis, left hip(s)  The patient history, physical examination, clinical judgement of the provider and imaging studies are consistent with end stage degenerative joint disease of the left hip(s) and total hip arthroplasty is deemed medically necessary. The treatment options including medical management, injection therapy, arthroscopy and arthroplasty were discussed at length. The risks and benefits of total hip arthroplasty were presented and reviewed. The risks due to aseptic loosening, infection, stiffness, dislocation/subluxation,  thromboembolic complications and other imponderables were discussed.  The patient acknowledged the explanation, agreed to proceed with the plan and consent was signed. Patient is being admitted for inpatient treatment for surgery, pain control, PT, OT, prophylactic antibiotics, VTE prophylaxis, progressive ambulation and ADL's and discharge planning.The patient is planning to be discharged home with home health services

## 2017-01-28 NOTE — Anesthesia Postprocedure Evaluation (Signed)
Anesthesia Post Note  Patient: April [name]  Procedure(s) Performed: Procedure(s) (LRB): LEFT TOTAL HIP ARTHROPLASTY ANTERIOR APPROACH (Left)  Patient location during evaluation: PACU Anesthesia Type: MAC Level of consciousness: oriented and awake and alert Pain management: pain level controlled Vital Signs Assessment: post-procedure vital signs reviewed and stable Respiratory status: spontaneous breathing, respiratory function stable and patient connected to nasal cannula oxygen Cardiovascular status: blood pressure returned to baseline and stable Postop Assessment: no headache and no backache Anesthetic complications: no       Last Vitals:  Vitals:   01/28/17 1536 01/28/17 1630  BP: (!) 114/45 140/61  Pulse: 63 69  Resp: 13 16  Temp: 36.4 C 36.7 C    Last Pain:  Vitals:   01/28/17 1630  TempSrc: Oral  PainSc:                  Ante Arredondo DAVID

## 2017-01-28 NOTE — Anesthesia Preprocedure Evaluation (Signed)
Anesthesia Evaluation  Patient identified by MRN, date of birth, ID band Patient awake    Reviewed: Allergy & Precautions, NPO status , Patient's Chart, lab work & pertinent test results  History of Anesthesia Complications (+) PONV  Airway Mallampati: I  TM Distance: >3 FB Neck ROM: Full    Dental   Pulmonary former smoker,    Pulmonary exam normal        Cardiovascular hypertension, Pt. on medications Normal cardiovascular exam+ Cardiac Defibrillator   Had cardiac arrest in 2000. Needed AICD. Never been shocked. Place magnet over device.   Neuro/Psych    GI/Hepatic   Endo/Other    Renal/GU      Musculoskeletal   Abdominal   Peds  Hematology   Anesthesia Other Findings   Reproductive/Obstetrics                             Anesthesia Physical Anesthesia Plan  ASA: III  Anesthesia Plan: Spinal and MAC   Post-op Pain Management:    Induction: Intravenous  Airway Management Planned: Simple Face Mask  Additional Equipment:   Intra-op Plan:   Post-operative Plan:   Informed Consent: I have reviewed the patients History and Physical, chart, labs and discussed the procedure including the risks, benefits and alternatives for the proposed anesthesia with the patient or authorized representative who has indicated his/her understanding and acceptance.     Plan Discussed with: CRNA and Surgeon  Anesthesia Plan Comments:         Anesthesia Quick Evaluation

## 2017-01-28 NOTE — Anesthesia Procedure Notes (Addendum)
Spinal  Patient location during procedure: OR Start time: 01/28/2017 11:40 AM End time: 01/28/2017 11:46 AM Staffing Anesthesiologist: Lillia Abed Resident/CRNA: Darlys Gales R Performed: resident/CRNA  Preanesthetic Checklist Completed: patient identified, site marked, surgical consent, pre-op evaluation, timeout performed, IV checked, risks and benefits discussed and monitors and equipment checked Spinal Block Patient position: sitting Prep: DuraPrep Patient monitoring: heart rate, continuous pulse ox and blood pressure Approach: midline Location: L3-4 Injection technique: single-shot Needle Needle type: Spinocan  Needle gauge: 22 G Needle length: 9 cm Needle insertion depth: 7 cm Assessment Sensory level: T6 Additional Notes Kit expiration date checked. Time out performed. Positive CSF and negative heme/parasthesia. Tolerated well, VSS. Loss of sensation and motor noted to LE after spinal.

## 2017-01-29 LAB — BASIC METABOLIC PANEL
ANION GAP: 7 (ref 5–15)
BUN: 16 mg/dL (ref 6–20)
CHLORIDE: 106 mmol/L (ref 101–111)
CO2: 25 mmol/L (ref 22–32)
Calcium: 8.4 mg/dL — ABNORMAL LOW (ref 8.9–10.3)
Creatinine, Ser: 0.49 mg/dL (ref 0.44–1.00)
GFR calc Af Amer: >60 mL/min (ref >60)
GFR calc non Af Amer: >60 mL/min (ref >60)
GLUCOSE: 106 mg/dL — AB (ref 65–99)
POTASSIUM: 4.7 mmol/L (ref 3.5–5.1)
SODIUM: 138 mmol/L (ref 135–145)

## 2017-01-29 LAB — CBC
HCT: 25.7 % — ABNORMAL LOW (ref 36.0–46.0)
HEMOGLOBIN: 8.3 g/dL — AB (ref 12.0–15.0)
MCH: 28.9 pg (ref 26.0–34.0)
MCHC: 32.3 g/dL (ref 30.0–36.0)
MCV: 89.5 fL (ref 78.0–100.0)
Platelets: 175 10*3/uL (ref 150–400)
RBC: 2.87 MIL/uL — AB (ref 3.87–5.11)
RDW: 14.8 % (ref 11.5–15.5)
WBC: 9.3 10*3/uL (ref 4.0–10.5)

## 2017-01-29 MED ORDER — OXYCODONE-ACETAMINOPHEN 5-325 MG PO TABS: 1.0000 | ORAL_TABLET | ORAL | 0 refills | Status: DC | PRN

## 2017-01-29 MED ORDER — ONDANSETRON 4 MG PO TBDP: 4.0000 mg | ORAL_TABLET | Freq: Three times a day (TID) | ORAL | 0 refills | Status: DC | PRN

## 2017-01-29 NOTE — Evaluation (Signed)
Occupational Therapy Evaluation Patient Details Name: April [name] MRN: [id number] DOB: 17-Oct-1941 Today's Date: 01/29/2017    History of Present Illness 76 y.o. female now s/p Lt anterior THA. PMH: MI, HTN, defibrillator   Clinical Impression   Pt was admitted for the above sx. All education was completed. No further OT is needed at this time    Follow Up Recommendations  Supervision/Assistance - 24 hour    Equipment Recommendations  None recommended by OT    Recommendations for Other Services       Precautions / Restrictions Precautions Precautions: Fall Restrictions Weight Bearing Restrictions: Yes LLE Weight Bearing: Weight bearing as tolerated      Mobility Bed Mobility            General bed mobility comments: oob by PT  Transfers Overall transfer level: Needs assistance Equipment used: Rolling walker (2 wheeled) Transfers: Sit to/from Stand Sit to Stand: Min assist         General transfer comment: cues for hand and LE placement    Balance Overall balance assessment: Needs assistance Sitting-balance support: No upper extremity supported Sitting balance-Leahy Scale: Good     Standing balance support: Bilateral upper extremity supported Standing balance-Leahy Scale: Poor Standing balance comment: using rw for support                            ADL Overall ADL's : Needs assistance/impaired     Grooming: Wash/dry hands;Supervision/safety;Standing                   Toilet Transfer: Minimal assistance;Ambulation;RW;Comfort height toilet   Toileting- Clothing Manipulation and Hygiene: Min guard;Sit to/from stand         General ADL Comments: daughters present and helping with pt.  Daughter assisted with donning underwear.  daughter also had this sx 5 years ago.  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, engineering      Pertinent Vitals/Pain Pain Assessment: 0-10 Pain Score: 1  Pain Location: L  hip Pain Descriptors / Indicators: Sore Pain Intervention(s): Limited activity within patient's tolerance;Monitored during session;Premedicated before session;Repositioned;Ice applied     Hand Dominance     Extremity/Trunk Assessment Upper Extremity Assessment Upper Extremity Assessment: Overall WFL for tasks assessed          Communication Communication Communication: No difficulties   Cognition Arousal/Alertness: Awake/alert Behavior During Therapy: WFL for tasks assessed/performed Overall Cognitive Status: Within Functional Limits for tasks assessed                     General Comments       Exercises       Shoulder Instructions      Home Living Family/patient expects to be discharged to:: Private residence Living Arrangements: Alone Available Help at Discharge: Family Type of Home: House Home Access: Stairs to enter [employer] of Steps:  (2 (no rail) or 4 (with rail))   Home Layout: Two level Alternate Level Stairs-Number of Steps: flight Alternate Level Stairs-Rails: Left Bathroom Shower/Tub: Walk-in shower (accessible)   Bathroom Toilet: Handicapped height     Home Equipment: Shower seat;Bedside commode   Additional Comments: staying with daughter      Prior Functioning/Environment Level of Independence: Independent with assistive device(s)        Comments: ambulatory with SPC        OT Problem List:  OT Treatment/Interventions:      OT Goals(Current goals can be found in the care plan section) Acute Rehab OT Goals Patient Stated Goal: Get back home living independently OT Goal Formulation: All assessment and education complete, DC therapy  OT Frequency:     Barriers to D/C:            Co-evaluation              End of Session    Activity Tolerance: Patient tolerated treatment well Patient left: in chair;with call bell/phone within reach;with chair alarm set   Time: 1478-2956 OT Time Calculation  (min): 18 min Charges:  OT General Charges $OT Visit: 1 Procedure OT Evaluation $OT Eval Low Complexity: 1 Procedure G-Codes:    Cassandre Oleksy February 05, 2017, 12:34 PM Marica Otter, OTR/L [phone] 02/05/2017

## 2017-01-29 NOTE — Care Management Note (Signed)
Case Management Note  Patient Details  Name: April [name] MRN: [id number] Date of Birth: 06/11/1941  Subjective/Objective:  Left THA                  Action/Plan: Discharge Planning: NCM spoke to pt and dtr, Clydie Braun will be at home to assist with care. Pt has another dtr who will assist. Requesting RW for home. Contacted [facility] DME rep for RW to be delivered to room prior to dc. Preoperatively arranged with [facility]. Offered choice and agreeable to [facility].    Expected Discharge Date:  01/30/17               Expected Discharge Plan:  Home w Home Health Services  In-House Referral:  NA  Discharge planning Services  CM Consult  Post Acute Care Choice:  Home Health Choice offered to:  Patient  DME Arranged:  Walker rolling DME Agency:  [facility]  HH Arranged:  PT HH Agency:  [facility] (formerly [facility])  Status of Service:  Completed, signed off  If discussed at [employer] of Stay Meetings, dates discussed:    Additional Comments:  Elliot Cousin, RN 01/29/2017, 11:07 AM

## 2017-01-29 NOTE — Evaluation (Signed)
Physical Therapy Evaluation Patient Details Name: April [name] MRN: [id number] DOB: Oct 25, 1941 Today's Date: 01/29/2017   History of Present Illness  76 y.o. female now s/p Lt anterior THA. PMH: MI, HTN, defibrillator  Clinical Impression  Pt is s/p anterior Lt THA resulting in the deficits listed below (see PT Problem List). Pt able to ambulate 30 ft with rw and min guard during her initial PT session. Daughter present and supportive. Pt reports that she will be going to her daughter's home following her acute stay. Pt will benefit from skilled PT to increase their independence and safety with mobility.       Follow Up Recommendations Home health PT;Supervision for mobility/OOB    Equipment Recommendations  Rolling walker with 5" wheels    Recommendations for Other Services       Precautions / Restrictions Precautions Precautions: Fall Restrictions Weight Bearing Restrictions: Yes LLE Weight Bearing: Weight bearing as tolerated      Mobility  Bed Mobility Overal bed mobility: Needs Assistance Bed Mobility: Supine to Sit     Supine to sit: Min assist;HOB elevated (with LLE, using rail to assist)        Transfers Overall transfer level: Needs assistance Equipment used: Rolling walker (2 wheeled) Transfers: Sit to/from Stand Sit to Stand: Min assist         General transfer comment: cues for hand placement  Ambulation/Gait Ambulation/Gait assistance: Min guard Ambulation Distance (Feet): 30 Feet Assistive device: Rolling walker (2 wheeled) Gait Pattern/deviations: Step-through pattern;Decreased step length - right;Decreased stance time - left     General Gait Details: cues for gait sequence  Stairs            Wheelchair Mobility    Modified Rankin (Stroke Patients Only)       Balance Overall balance assessment: Needs assistance Sitting-balance support: No upper extremity supported Sitting balance-Leahy Scale: Good     Standing balance  support: Bilateral upper extremity supported Standing balance-Leahy Scale: Poor Standing balance comment: using rw for support                             Pertinent Vitals/Pain Pain Assessment: 0-10 Pain Score: 1  Pain Location: Lt hip Pain Descriptors / Indicators: Aching Pain Intervention(s): Limited activity within patient's tolerance;Monitored during session;Ice applied    Home Living Family/patient expects to be discharged to:: Private residence Living Arrangements: Alone Available Help at Discharge: Family (will stay with youngest daughter) Type of Home: House Home Access: Stairs to enter   Entrance Stairs-Number of Steps:  (2 (no rail) or 4 (with rail)) Home Layout: Two level Home Equipment: Cane - single point;Shower seat Additional Comments: living situation reflecting daughter's home    Prior Function Level of Independence: Independent with assistive device(s)         Comments: ambulatory with SPC     Hand Dominance        Extremity/Trunk Assessment   Upper Extremity Assessment Upper Extremity Assessment: Overall WFL for tasks assessed    Lower Extremity Assessment Lower Extremity Assessment: LLE deficits/detail LLE Deficits / Details: physical assist needed to move laterally in bed.        Communication   Communication: No difficulties  Cognition Arousal/Alertness: Awake/alert Behavior During Therapy: WFL for tasks assessed/performed Overall Cognitive Status: Within Functional Limits for tasks assessed                      General Comments  Exercises     Assessment/Plan    PT Assessment Patient needs continued PT services  PT Problem List Decreased strength;Decreased range of motion;Decreased activity tolerance;Decreased balance;Decreased mobility          PT Treatment Interventions DME instruction;Gait training;Stair training;Functional mobility training;Therapeutic activities;Therapeutic exercise;Balance  training;Patient/family education    PT Goals (Current goals can be found in the Care Plan section)  Acute Rehab PT Goals Patient Stated Goal: Get back home living independently PT Goal Formulation: With patient Time For Goal Achievement: 02/12/17 Potential to Achieve Goals: Good    Frequency 7X/week   Barriers to discharge        Co-evaluation               End of Session Equipment Utilized During Treatment: Gait belt Activity Tolerance: Patient tolerated treatment well Patient left: in chair;with call bell/phone within reach;with family/visitor present Nurse Communication: Mobility status         Time: 1610-9604 PT Time Calculation (min) (ACUTE ONLY): 23 min   Charges:   PT Evaluation $PT Eval Moderate Complexity: 1 Procedure PT Treatments $Gait Training: 8-22 mins   PT G Codes:        Christiane Ha, PT, CSCS Pager [phone] Office [phone]  01/29/2017, 9:09 AM

## 2017-01-29 NOTE — Progress Notes (Signed)
Physical Therapy Treatment Patient Details Name: April [name] MRN: [id number] DOB: 1941/07/19 Today's Date: 01/29/2017    History of Present Illness 76 y.o. female now s/p Lt anterior THA. PMH: MI, HTN, defibrillator    PT Comments    Pt making good progress with mobility, able to ambulate 120 ft with rw. Anticipate D/C to home with family support following acute stay. Pt will need to perform stairs prior to D/c.   Follow Up Recommendations  Home health PT;Supervision for mobility/OOB     Equipment Recommendations  Rolling walker with 5" wheels    Recommendations for Other Services       Precautions / Restrictions Precautions Precautions: Fall Restrictions Weight Bearing Restrictions: Yes LLE Weight Bearing: Weight bearing as tolerated    Mobility  Bed Mobility Overal bed mobility: Needs Assistance Bed Mobility: Sit to Supine       Sit to supine: Min assist   General bed mobility comments: assist proviced with LLE into bed  Transfers Overall transfer level: Needs assistance Equipment used: Rolling walker (2 wheeled) Transfers: Sit to/from Stand Sit to Stand: Min guard         General transfer comment: cues for hand and LE placement. Performed from chiar and toilet  Ambulation/Gait Ambulation/Gait assistance: Min guard Ambulation Distance (Feet): 120 Feet Assistive device: Rolling walker (2 wheeled) Gait Pattern/deviations: Step-through pattern;Decreased weight shift to left;Decreased stance time - left;Trunk flexed Gait velocity: decreased   General Gait Details: cues for posture and position in rw   Stairs            Wheelchair Mobility    Modified Rankin (Stroke Patients Only)       Balance Overall balance assessment: Needs assistance Sitting-balance support: No upper extremity supported Sitting balance-Leahy Scale: Good     Standing balance support: Bilateral upper extremity supported Standing balance-Leahy Scale: Poor Standing  balance comment: using rw for support                    Cognition Arousal/Alertness: Awake/alert Behavior During Therapy: WFL for tasks assessed/performed Overall Cognitive Status: Within Functional Limits for tasks assessed                      Exercises Total Joint Exercises Ankle Circles/Pumps: AROM;Both;10 reps Quad Sets: Strengthening;Left;10 reps Heel Slides: AAROM;Left;10 reps Hip ABduction/ADduction: Strengthening;Left;10 reps (mod assist)    General Comments        Pertinent Vitals/Pain Pain Assessment: 0-10 Pain Score: 2  Pain Location: Lt hip Pain Descriptors / Indicators: Sore Pain Intervention(s): Limited activity within patient's tolerance;Monitored during session;Ice applied    Home Living Family/patient expects to be discharged to:: Private residence Living Arrangements: Alone Available Help at Discharge: Family         Home Equipment: Shower seat;Bedside commode Additional Comments: staying with daughter    Prior Function Level of Independence: Independent with assistive device(s)          PT Goals (current goals can now be found in the care plan section) Acute Rehab PT Goals Patient Stated Goal: get back to bed PT Goal Formulation: With patient Time For Goal Achievement: 02/12/17 Potential to Achieve Goals: Good Progress towards PT goals: Progressing toward goals    Frequency    7X/week      PT Plan Current plan remains appropriate    Co-evaluation             End of Session Equipment Utilized During Treatment: Gait belt Activity Tolerance: Patient  tolerated treatment well Patient left: in bed;with call bell/phone within reach;with family/visitor present;with SCD's reapplied     Time: 3545-6256 PT Time Calculation (min) (ACUTE ONLY): 28 min  Charges:  $Gait Training: 8-22 mins $Therapeutic Exercise: 8-22 mins                    G Codes:      Christiane Ha, PT, CSCS Pager [phone] Office [phone]  [phone]  01/29/2017, 2:25 PM

## 2017-01-29 NOTE — Discharge Instructions (Signed)

## 2017-01-29 NOTE — Progress Notes (Signed)
Patient ID: April [name], female   DOB: July 29, 1941, 76 y.o.   MRN: [id number] Patient is a 76 year old woman who is postoperative day 1 left hip total hip arthroplasty. She has no complaints this morning. Possible discharge to home on Sunday if she is showing good progress with physical therapy.

## 2017-01-30 MED ORDER — METHOCARBAMOL 500 MG PO TABS: 500.0000 mg | ORAL_TABLET | Freq: Three times a day (TID) | ORAL | 0 refills | Status: DC

## 2017-01-30 NOTE — Progress Notes (Signed)
RN reviewed discharge instructions with patient and daughters. All questions answered.   Paperwork and prescriptions given.   NT rolled patient down with all belongings to family car.

## 2017-01-30 NOTE — Progress Notes (Signed)
Physical Therapy Treatment Patient Details Name: April [name] MRN: [id number] DOB: 12-Aug-1941 Today's Date: 01/30/2017    History of Present Illness 76 y.o. female now s/p Lt anterior THA. PMH: MI, HTN, defibrillator    PT Comments    POD # 2 am session Assisted with amb in hallway and practiced 2 stairs then returned to room to perform THR TE's followed by ICE. Will need to practice stairs again with daughter.  Follow Up Recommendations  Home health PT;Supervision for mobility/OOB     Equipment Recommendations  Rolling walker with 5" wheels    Recommendations for Other Services       Precautions / Restrictions Precautions Precautions: Fall Restrictions Weight Bearing Restrictions: No LLE Weight Bearing: Weight bearing as tolerated    Mobility  Bed Mobility               General bed mobility comments: OOB in recliner  Transfers Overall transfer level: Needs assistance Equipment used: Rolling walker (2 wheeled) Transfers: Sit to/from Stand Sit to Stand: Supervision;Min guard         General transfer comment: <25% VC's on proper hand placement and safety with turns  Ambulation/Gait Ambulation/Gait assistance: Supervision Ambulation Distance (Feet): 45 Feet Assistive device: Rolling walker (2 wheeled) Gait Pattern/deviations: Step-through pattern;Decreased weight shift to left;Decreased stance time - left;Trunk flexed Gait velocity: decreased   General Gait Details: cues for posture and position in rw   Stairs Stairs: Yes   Stair Management: No rails;Step to pattern;Backwards;With walker Number of Stairs: 2 General stair comments: 50% VC's on proper tech and walker placement.  Handout also given  Wheelchair Mobility    Modified Rankin (Stroke Patients Only)       Balance                                    Cognition Arousal/Alertness: Awake/alert Behavior During Therapy: WFL for tasks assessed/performed Overall Cognitive  Status: Within Functional Limits for tasks assessed                      Exercises   Total Hip Replacement TE's 10 reps ankle pumps 10 reps knee presses 10 reps heel slides 10 reps SAQ's 10 reps ABD Followed by ICE     General Comments        Pertinent Vitals/Pain Pain Assessment: No/denies pain Pain Score: 4  Pain Location: Lt hip Pain Descriptors / Indicators: Discomfort;Tender;Sore Pain Intervention(s): Monitored during session;Repositioned;Ice applied    Home Living                      Prior Function            PT Goals (current goals can now be found in the care plan section) Progress towards PT goals: Progressing toward goals    Frequency    7X/week      PT Plan Current plan remains appropriate    Co-evaluation             End of Session Equipment Utilized During Treatment: Gait belt Activity Tolerance: Patient tolerated treatment well Patient left: in chair;with call bell/phone within reach     Time: 0840-0905 PT Time Calculation (min) (ACUTE ONLY): 25 min  Charges:  $Gait Training: 8-22 mins $Therapeutic Exercise: 8-22 mins                    G Codes:  Rica Koyanagi  PTA [facility]  Acute  Rehab Pager      [phone]

## 2017-01-30 NOTE — Progress Notes (Signed)
Physical Therapy Treatment Patient Details Name: Neilani Cisneroz MRN: [id number] DOB: 21-Jan-1941 Today's Date: 01/30/2017    History of Present Illness 76 y.o. female now s/p Lt anterior THA. PMH: MI, HTN, defibrillator    PT Comments    POD # 2 pm session with daughter   Follow Up Recommendations  Home health PT;Supervision for mobility/OOB     Equipment Recommendations  Rolling walker with 5" wheels    Recommendations for Other Services       Precautions / Restrictions Precautions Precautions: Fall Restrictions Weight Bearing Restrictions: No LLE Weight Bearing: Weight bearing as tolerated    Mobility  Bed Mobility               General bed mobility comments: OOB in recliner  Transfers Overall transfer level: Needs assistance Equipment used: Rolling walker (2 wheeled) Transfers: Sit to/from Stand Sit to Stand: Supervision;Min guard         General transfer comment: <25% VC's on proper hand placement and safety with turns      had daughter assist "hands on" with instruction on safety  Ambulation/Gait Ambulation/Gait assistance: Supervision Ambulation Distance (Feet): 22 Feet Assistive device: Rolling walker (2 wheeled) Gait Pattern/deviations: Step-through pattern;Decreased weight shift to left;Decreased stance time - left;Trunk flexed Gait velocity: decreased   General Gait Details: cues for posture and position in rw   Stairs Stairs: Yes   Stair Management: No rails;Step to pattern;Backwards;With walker Number of Stairs: 2 General stair comments: 50% VC's on proper tech and walker placement.  Performed with daughter present "hands on"   Wheelchair Mobility    Modified Rankin (Stroke Patients Only)       Balance                                    Cognition Arousal/Alertness: Awake/alert Behavior During Therapy: WFL for tasks assessed/performed Overall Cognitive Status: Within Functional Limits for tasks assessed                       Exercises      General Comments        Pertinent Vitals/Pain Pain Assessment: No/denies pain Pain Score: 4  Pain Location: Lt hip Pain Descriptors / Indicators: Discomfort;Tender;Sore Pain Intervention(s): Monitored during session;Repositioned;Ice applied    Home Living                      Prior Function            PT Goals (current goals can now be found in the care plan section) Progress towards PT goals: Progressing toward goals    Frequency    7X/week      PT Plan Current plan remains appropriate    Co-evaluation             End of Session Equipment Utilized During Treatment: Gait belt Activity Tolerance: Patient tolerated treatment well Patient left: in chair;with call bell/phone within reach     Time: 1155-1207 PT Time Calculation (min) (ACUTE ONLY): 12 min  Charges:  $Gait Training: 8-22 mins                    G Codes:      Felecia Shelling  PTA [facility]  Acute  Rehab Pager      [phone]

## 2017-01-30 NOTE — Discharge Summary (Signed)
Discharge Diagnoses:  Principal Problem:   Unilateral primary osteoarthritis, left hip Active Problems:   Status post left hip replacement   Surgeries: Procedure(s): LEFT TOTAL HIP ARTHROPLASTY ANTERIOR APPROACH on 01/28/2017    Consultants:   Discharged Condition: Improved  Hospital Course: [place] Langstaff is an 76 y.o. female who was admitted 01/28/2017 with a chief complaint of left hip osteoarthritis, with a final diagnosis of osteoarthritis left hip.  Patient was brought to the operating room on 01/28/2017 and underwent Procedure(s): LEFT TOTAL HIP ARTHROPLASTY ANTERIOR APPROACH.    Patient was given perioperative antibiotics: Anti-infectives    Start     Dose/Rate Route Frequency Ordered Stop   01/28/17 1700  clindamycin (CLEOCIN) IVPB 600 mg     600 mg 100 mL/hr over 30 Minutes Intravenous Every 6 hours 01/28/17 1546 01/28/17 2230   01/28/17 0938  clindamycin (CLEOCIN) IVPB 900 mg     900 mg 100 mL/hr over 30 Minutes Intravenous On call to O.R. 01/28/17 [id number] 01/28/17 1217    .  Patient was given sequential compression devices, early ambulation, and aspirin for DVT prophylaxis.  Recent vital signs: Patient Vitals for the past 24 hrs:  BP Temp Temp src Pulse Resp SpO2  01/30/17 0524 (!) 125/43 99.1 F (37.3 C) Oral 82 16 94 %  01/29/17 2106 (!) 117/33 99.8 F (37.7 C) - 78 18 97 %  01/29/17 1416 116/77 97.6 F (36.4 C) Oral 70 16 -  .  Recent laboratory studies: Dg Pelvis Portable  Result Date: 01/28/2017 CLINICAL DATA:  76 year old female with history of left-sided hip replacement. EXAM: PORTABLE PELVIS 1-2 VIEWS COMPARISON:  11/24/2016. FINDINGS: Single AP view of the low anatomic pelvis demonstrates postoperative changes of left hip arthroplasty. Both the femoral and acetabular components of the prosthesis appear well seated without periprosthetic fracture or other immediate complicating features. Gas is present in the left hip joint and overlying soft tissues. Degenerative  changes of moderate to severe osteoarthritis are noted in the right hip joint. IMPRESSION: 1. Expected postoperative findings following left hip arthroplasty without complicating features, as above. These results will be called to the ordering clinician or representative by the Radiologist Assistant, and communication documented in the PACS or zVision Dashboard. Electronically Signed   By: Trudie Reed M.D.   On: 01/28/2017 14:53   Dg C-arm 1-60 Min-no Report  Result Date: 01/28/2017 Fluoroscopy was utilized by the requesting physician.  No radiographic interpretation.   Dg Hip Operative Unilat With Pelvis Left  Result Date: 01/28/2017 CLINICAL DATA:  Status post left hip replacement. EXAM: OPERATIVE left HIP (WITH PELVIS IF PERFORMED) 2 VIEWS Fluoroscopy time:  27 seconds. TECHNIQUE: Fluoroscopic spot image(s) were submitted for interpretation post-operatively. COMPARISON:  Radiographs of November 24, 2016. FINDINGS: The femoral and acetabular components appear to be well situated. No fracture or dislocation is noted. Expected postoperative changes are seen involving surrounding soft tissues. IMPRESSION: Status post left total hip arthroplasty. Electronically Signed   By: Lupita Raider, M.D.   On: 01/28/2017 13:08    Discharge Medications:   Allergies as of 01/30/2017      Reactions   Codeine Nausea And Vomiting   Penicillins Hives, Rash   As a child Has patient had a PCN reaction causing immediate rash, facial/tongue/throat swelling, SOB or lightheadedness with hypotension:unsure Has patient had a PCN reaction causing severe rash involving mucus membranes or skin necrosis:unsure Has patient had a PCN reaction that required hospitalization:No Has patient had a PCN reaction occurring within the  last 10 years:No If all of the above answers are "NO", then may proceed with Cephalosporin use.      Medication List    TAKE these medications   acetaminophen 500 MG tablet Commonly known as:   TYLENOL Take 500-1,000 mg by mouth every 6 (six) hours as needed (pain).   AIRBORNE GUMMIES Chew Chew 1 tablet by mouth 2 (two) times daily.   aspirin EC 325 MG tablet Take 325 mg by mouth at bedtime.   carvedilol 25 MG tablet Commonly known as:  COREG Take 2 tablets (50 mg total) by mouth 2 (two) times daily with a meal.   diphenhydrAMINE 25 MG tablet Commonly known as:  BENADRYL Take 50 mg by mouth at bedtime as needed for allergies.   enalapril 10 MG tablet Commonly known as:  VASOTEC Take 1 tablet by mouth two  times daily What changed:  how much to take  how to take this  when to take this  additional instructions   hydrochlorothiazide 25 MG tablet Commonly known as:  HYDRODIURIL Take 1 tablet by mouth  daily What changed:  how much to take  how to take this  when to take this  additional instructions   methocarbamol 500 MG tablet Commonly known as:  ROBAXIN Take 1 tablet (500 mg total) by mouth 3 (three) times daily.   ondansetron 4 MG disintegrating tablet Commonly known as:  ZOFRAN ODT Take 1 tablet (4 mg total) by mouth every 8 (eight) hours as needed for nausea or vomiting.   oxyCODONE-acetaminophen 5-325 MG tablet Commonly known as:  ROXICET Take 1-2 tablets by mouth every 4 (four) hours as needed.   potassium chloride SA 20 MEQ tablet Commonly known as:  K-DUR,KLOR-CON Take 1 tablet by mouth  daily What changed:  how much to take  how to take this  when to take this  additional instructions            Durable Medical Equipment        Start     Ordered   01/28/17 1547  DME 3 n 1  Once     01/28/17 1546   01/28/17 1547  DME Walker rolling  Once    Question:  Patient needs a walker to treat with the following condition  Answer:  Status post left hip replacement   01/28/17 1546      Diagnostic Studies: Dg Pelvis Portable  Result Date: 01/28/2017 CLINICAL DATA:  76 year old female with history of left-sided hip replacement.  EXAM: PORTABLE PELVIS 1-2 VIEWS COMPARISON:  11/24/2016. FINDINGS: Single AP view of the low anatomic pelvis demonstrates postoperative changes of left hip arthroplasty. Both the femoral and acetabular components of the prosthesis appear well seated without periprosthetic fracture or other immediate complicating features. Gas is present in the left hip joint and overlying soft tissues. Degenerative changes of moderate to severe osteoarthritis are noted in the right hip joint. IMPRESSION: 1. Expected postoperative findings following left hip arthroplasty without complicating features, as above. These results will be called to the ordering clinician or representative by the Radiologist Assistant, and communication documented in the PACS or zVision Dashboard. Electronically Signed   By: Trudie Reed M.D.   On: 01/28/2017 14:53   Dg C-arm 1-60 Min-no Report  Result Date: 01/28/2017 Fluoroscopy was utilized by the requesting physician.  No radiographic interpretation.   Dg Hip Operative Unilat With Pelvis Left  Result Date: 01/28/2017 CLINICAL DATA:  Status post left hip replacement. EXAM: OPERATIVE left HIP (  WITH PELVIS IF PERFORMED) 2 VIEWS Fluoroscopy time:  27 seconds. TECHNIQUE: Fluoroscopic spot image(s) were submitted for interpretation post-operatively. COMPARISON:  Radiographs of November 24, 2016. FINDINGS: The femoral and acetabular components appear to be well situated. No fracture or dislocation is noted. Expected postoperative changes are seen involving surrounding soft tissues. IMPRESSION: Status post left total hip arthroplasty. Electronically Signed   By: Lupita Raider, M.D.   On: 01/28/2017 13:08    Patient benefited maximally from their hospital stay and there were no complications.     Disposition: 01-Home or Self Care Discharge Instructions    Call MD / Call 911    Complete by:  As directed    If you experience chest pain or shortness of breath, CALL 911 and be transported to  the hospital emergency room.  If you develope a fever above 101 F, pus (white drainage) or increased drainage or redness at the wound, or calf pain, call your surgeon's office.   Constipation Prevention    Complete by:  As directed    Drink plenty of fluids.  Prune juice may be helpful.  You may use a stool softener, such as Colace (over the counter) 100 mg twice a day.  Use MiraLax (over the counter) for constipation as needed.   Diet - low sodium heart healthy    Complete by:  As directed    Increase activity slowly as tolerated    Complete by:  As directed      Follow-up Information    [facility] Follow up.   Specialty:  Home Health Services Why:  Home Health Physical Therapy Contact information: [address] [place] [address] [phone]        Kathryne Hitch, MD Follow up in 2 week(s).   Specialty:  Orthopedic Surgery Contact information: [address] [place] [address] [phone]            Signed: Nadara Mustard 01/30/2017, 12:34 PM

## 2017-01-31 NOTE — Op Note (Signed)
[name], [name]                ACCOUNT NO.:  [id number]  MEDICAL RECORD NO.:  [id number]  LOCATION:                                 FACILITY:  PHYSICIAN:  Vanita Panda. Magnus Ivan, M.D.DATE OF BIRTH:  10-Jul-1941  DATE OF PROCEDURE:  01/28/2017 DATE OF DISCHARGE:                              OPERATIVE REPORT   PREOPERATIVE DIAGNOSIS:  Primary osteoarthritis and degenerative joint disease, left hip.  POSTOPERATIVE DIAGNOSIS:  Primary osteoarthritis and degenerative joint disease, left hip.  PROCEDURE:  Left total hip arthroplasty through direct anterior approach.  IMPLANTS:  DePuy Sector Gription acetabular component size 50, size 32+ 4 neutral polyethylene liner, size 13 Corail femoral component with standard offset, size 32+ 1 ceramic hip ball.  SURGEON:  Vanita Panda. Magnus Ivan, MD  ASSISTANT:  Richardean Canal, PA-C  ANESTHESIA:  Spinal.  ANTIBIOTICS:  900 mg of IV clindamycin.  BLOOD LOSS:  250 mL.  COMPLICATIONS:  None.  INDICATIONS:  April [name] is 76 year old female, well known to me.  She has debilitating arthritis with collapse of her femoral head on her left hip.  This detrimentally affected her activities of daily living, her quality of life, and her mobility.  She does wish to proceed with a total hip arthroplasty at this point, given the failure of conservative treatment.  We did obtain cardiac clearance given the fact she has a defibrillator and had other cardiac issues in the past.  We did receive this clearance.  We talked to her in detail about surgery and especially the risks to her heart as well as the risk of acute blood loss anemia, nerve and vessel injury, fracture, infection, DVT, and dislocation.  She understands our goals are decreased pain, improved mobility, and overall improved quality of life.  Her pain is severe and it is 10/10, at this point, she does wish to proceed.  PROCEDURE DESCRIPTION:  After informed consent was obtained,  appropriate left hip was marked.  She was brought to the operating room.  Spinal anesthesia was obtained while she was on her stretcher.  A Foley catheter was placed and then both feet had traction boots applied to them.  Next, she was placed supine on the Hana fracture table with the perineal post in place and both legs in inline skeletal traction devices, but no traction applied.  Her left operative hip was then prepped and draped with DuraPrep and sterile drapes.  A time-out was called.  She was identified as correct patient and correct left hip.  We then made incision just inferior and posterior to the anterior and superior iliac spine and carried this obliquely down the leg.  We dissected down tensor fascia lata muscle.  The tensor fascia was then divided longitudinally to proceed with a direct anterior approach to the hip.  We identified and cauterized the circumflex vessels and identified the hip capsule.  I opened up the hip capsule in an L-type format finding a moderate joint effusion and significant periarticular osteophytes.  We placed Cobra retractors around the medial and lateral femoral neck and then made our femoral neck cut with an oscillating saw just proximal to the lesser trochanter and completed this on osteotome.  I placed a corkscrew guide in the femoral head and removed the femoral head in its entirety and found to be completely devoid of cartilage and flattened.  We then placed a bent Hohmann over the medial acetabular rim and cleaned remnants of acetabular labrum and other debris from the acetabulum.  I then began reaming under direct visualization from a size 42 reamer in stepwise increments up to size 50, with all reamers under direct visualization, the last reamer under direct fluoroscopy, so I could obtain my depth of reaming, our inclination, and anteversion. Once I was pleased with this, I placed the real DePuy Sector Gription acetabular component, size 50  and a 32+ 4 polyethylene liner, given the significant shortening that she had preop.  We then externally rotated the leg to about 130 degrees, extended and abducted it.  We placed a Mueller retractor medially and a Hohmann retractor behind the greater trochanter.  We released lateral joint capsule and used a box cutting osteotome to enter the femoral canal, entered the femoral canal and a rongeur to lateralize.  We then began broaching from a size 8 broach using the Corail broaching system up to a size 13.  With the 13 in place, which was being a little proud, but we had a shorter neck cut, we went with a 32+ 1 hip ball trial.  We brought the leg back over and up with traction and rotation reducing the pelvis and we were pleased with leg length, offset, and stability.  We then dislocated the hip and removed the trial components.  We then placed the real size 13 Corail femoral component with standard offset and a real 32+ 1 ceramic hip ball, reduced this in the acetabulum and again it was stable.  We then irrigated the soft tissue with normal saline solution using pulsatile lavage.  We were able to close some of the joint capsule with interrupted #1 Ethibond suture followed by running #1 Vicryl in the tensor fascia, 0 Vicryl in the deep tissue, 2-0 Vicryl in the subcutaneous tissue, 4-0 Monocryl subcuticular stitch, and Steri-Strips on the skin.  Aquacel dressing was applied.  She was taken off the fracture table, taken to the recovery room in stable condition.  All final counts were correct.  There were no complications noted.  Of note, Richardean Canal, PA-C, assisted in entire case.  His assistance was crucial for facilitating all aspects of this case.     Vanita Panda. Magnus Ivan, M.D.   ______________________________ Vanita Panda. Magnus Ivan, M.D.    CYB/MEDQ  D:  01/28/2017  T:  01/29/2017  Job:  [id number]

## 2017-02-02 ENCOUNTER — Telehealth: Payer: Self-pay | Admitting: Internal Medicine

## 2017-02-02 NOTE — Telephone Encounter (Signed)
New Message  Pt c/o medication issue:  1. Name of Medication: enalapril  2. How are you currently taking this medication (dosage and times per day)? 10mg    3. Are you having a reaction (difficulty breathing--STAT)? Per pt states she has had high bp 120/55  4. What is your medication issue? Pt would like to discuss her going back on the medication. Please call back to discuss

## 2017-02-02 NOTE — Telephone Encounter (Addendum)
Called, spoke with pt's daughter, Selena Batten (on [place]). Questions r/t pt's recent d/c from the hospital on 01/30/17 (pt had hip surgery). Pt wants to know when to resume Enalpril. Daughter stated med was d/c'd by Dr. Magnus Ivan. I will f/u with his office (i did not see Enalpril d/c'd in hospital d/c summary from 01/30/17).

## 2017-02-03 NOTE — Telephone Encounter (Signed)
Called Dr. Doroteo Glassman office. Nurse unavailable - left voice message. Informed needed clairification r/t Enalapril. Requested return call asap.

## 2017-02-03 NOTE — Telephone Encounter (Signed)
Called, spoke with pt. Informed I will call pt back after I hear back from Dr. Doroteo Glassman office . Pt verbalized understand and thanked me for calling.

## 2017-02-04 NOTE — Telephone Encounter (Signed)
Called, spoke with pt. Informed Dr. Doroteo Glassman office called stating it is okay to resume Enalapril. Pt stated her BP today was 147/78. Informed if pt experiences dizziness or a drop in blood pressure, to call our office for further recommendation. Pt verbalized understanding and agreed with plan.

## 2017-02-04 NOTE — Telephone Encounter (Signed)
Received incoming call from Nurse at Dr. Esmeralda Links office. Pt may resume Enalapril now. I will contact pt with med information.

## 2017-02-14 ENCOUNTER — Ambulatory Visit (INDEPENDENT_AMBULATORY_CARE_PROVIDER_SITE_OTHER): Payer: 59 | Admitting: Physician Assistant

## 2017-02-14 DIAGNOSIS — Z96642 Presence of left artificial hip joint: Secondary | ICD-10-CM

## 2017-02-14 NOTE — Progress Notes (Signed)
   Office Visit Note   Patient: April [name]           Date of Birth: August 21, 1941           MRN: [id number] Visit Date: 02/14/2017              Requested by: No referring provider defined for this encounter. PCP: No PCP Per Patient   Assessment & Plan: Visit Diagnoses:  1. Status post total replacement of left hip     Plan: She will go back to her daily aspirin. Finish out with physical therapy and then just walking mainly for strengthening of the left hip. Scar tissue mobilization over the surgical incision encouraged. Follow with [place] in 1 month  Follow-Up Instructions: Return in about 4 weeks (around 03/14/2017).   Orders:  No orders of the defined types were placed in this encounter.  No orders of the defined types were placed in this encounter.     Procedures: No procedures performed   Clinical Data: No additional findings.   Subjective: No chief complaint on file.   HPI April [name] 2 weeks status post left total hip arthroplasty. Patient overall is doing well no complaints denies shortness breath fevers chills. She states she is doing very well and has no concerns Review of Systems   Objective: Vital Signs: There were no vitals taken for this visit.  Physical Exam  Ortho Exam Left hip surgical incision is healing well no signs of infection. No seroma. She has good range of motion of left hip she ambulates with a cane and a nonantalgic gait. Left calf supple nontender. She's able to dorsiflex her left foot. Specialty Comments:  No specialty comments available.  Imaging: No results found.   PMFS History: Patient Active Problem List   Diagnosis Date Noted  . Unilateral primary osteoarthritis, left hip 01/28/2017  . Status post left hip replacement 01/28/2017  . 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 VENTRICULAR FIBRILLATION 02/03/2010  . Essential hypertension 01/09/2010   Past Medical History:  Diagnosis Date  . AICD  (automatic cardioverter/defibrillator) present   . Arthritis   . HTN (hypertension)   . Myocardial infarction    mild Mi 2000  . PONV (postoperative nausea and vomiting)   . Ventricular fibrillation (HCC)     Family History  Problem Relation Age of Onset  . Heart attack Mother   . Heart attack Father     x2  . Diabetes Maternal Aunt   . Diabetes Maternal Grandmother   . Heart Problems Maternal Grandmother     Past Surgical History:  Procedure Laterality Date  . IMPLANTABLE CARDIOVERTER DEFIBRILLATOR (ICD) GENERATOR CHANGE N/A 10/14/2014   Procedure: ICD GENERATOR CHANGE;  Surgeon: Marinus Maw, MD;  Location: [facility] CATH LAB;  Service: Cardiovascular;  Laterality: N/A;  . IMPLANTABLE CARDIOVERTER DEFIBRILLATOR IMPLANT  2000; 10-14-2014   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; generator change 09-2014 by Dr Ladona Ridgel  . TOTAL HIP ARTHROPLASTY Left 01/28/2017   Procedure: LEFT TOTAL HIP ARTHROPLASTY ANTERIOR APPROACH;  Surgeon: Kathryne Hitch, MD;  Location: [facility] ORS;  Service: Orthopedics;  Laterality: Left;   Social History   Occupational History  . Not on file.   Social History Main Topics  . Smoking status: Former Smoker    Quit date: 10/27/1999  . Smokeless tobacco: Never Used  . Alcohol use No  . Drug use: No  . Sexual activity: Not on file

## 2017-03-07 ENCOUNTER — Ambulatory Visit (INDEPENDENT_AMBULATORY_CARE_PROVIDER_SITE_OTHER): Payer: 59 | Admitting: Physician Assistant

## 2017-03-09 ENCOUNTER — Encounter (INDEPENDENT_AMBULATORY_CARE_PROVIDER_SITE_OTHER): Payer: Self-pay | Admitting: Physician Assistant

## 2017-03-09 ENCOUNTER — Encounter (INDEPENDENT_AMBULATORY_CARE_PROVIDER_SITE_OTHER): Payer: Self-pay

## 2017-03-09 ENCOUNTER — Ambulatory Visit (INDEPENDENT_AMBULATORY_CARE_PROVIDER_SITE_OTHER): Payer: 59 | Admitting: Physician Assistant

## 2017-03-09 DIAGNOSIS — Z96642 Presence of left artificial hip joint: Secondary | ICD-10-CM

## 2017-03-09 NOTE — Progress Notes (Signed)
Orthopedic Surgery Postop Note Status  post left total hip arthroplasty to 01/28/17 Subjective: Patient overall is doing well no complaints. She had some numbness about the left hip which is resolved. He stopped using a cane. She states having no fevers chills or calf pain  Objective: Left hip surgical incisions healing well no signs of infection. Left calf supple nontender. She is able to cross her legs. -  2+ distal pulses  Plan: We'll see her back at 1 year status post left total hip arthroplasty. At that time we'll obtain an AP pelvis and a lateral view of her left hip. Sooner if there is any questions or concerns.

## 2017-03-29 ENCOUNTER — Encounter: Payer: Self-pay | Admitting: Internal Medicine

## 2017-04-12 ENCOUNTER — Ambulatory Visit (INDEPENDENT_AMBULATORY_CARE_PROVIDER_SITE_OTHER): Payer: 59 | Admitting: Internal Medicine

## 2017-04-12 ENCOUNTER — Encounter: Payer: Self-pay | Admitting: Internal Medicine

## 2017-04-12 ENCOUNTER — Encounter (INDEPENDENT_AMBULATORY_CARE_PROVIDER_SITE_OTHER): Payer: Self-pay

## 2017-04-12 DIAGNOSIS — I4901 Ventricular fibrillation: Secondary | ICD-10-CM | POA: Diagnosis not present

## 2017-04-12 DIAGNOSIS — Z95810 Presence of automatic (implantable) cardiac defibrillator: Secondary | ICD-10-CM

## 2017-04-12 LAB — CUP PACEART INCLINIC DEVICE CHECK
Battery Remaining Longevity: 122 mo
Date Time Interrogation Session: 20180417161806
HighPow Impedance: 47 Ohm
HighPow Impedance: 56 Ohm
Implantable Lead Model: 6942
Implantable Pulse Generator Implant Date: 20151019
Lead Channel Impedance Value: 399 Ohm
Lead Channel Pacing Threshold Pulse Width: 0.4 ms
Lead Channel Setting Pacing Amplitude: 2 V
Lead Channel Setting Pacing Pulse Width: 0.4 ms
MDC IDC LEAD IMPLANT DT: 20000821
MDC IDC LEAD LOCATION: 753860
MDC IDC MSMT BATTERY VOLTAGE: 3.01 V
MDC IDC MSMT LEADCHNL RV IMPEDANCE VALUE: 418 Ohm
MDC IDC MSMT LEADCHNL RV PACING THRESHOLD AMPLITUDE: 0.625 V
MDC IDC MSMT LEADCHNL RV SENSING INTR AMPL: 6 mV
MDC IDC MSMT LEADCHNL RV SENSING INTR AMPL: 7.875 mV
MDC IDC SET LEADCHNL RV SENSING SENSITIVITY: 0.3 mV
MDC IDC STAT BRADY RV PERCENT PACED: 0.01 %

## 2017-04-12 MED ORDER — HYDROCHLOROTHIAZIDE 25 MG PO TABS: ORAL_TABLET | ORAL | 3 refills | Status: DC

## 2017-04-12 MED ORDER — POTASSIUM CHLORIDE CRYS ER 20 MEQ PO TBCR: EXTENDED_RELEASE_TABLET | ORAL | 3 refills | Status: DC

## 2017-04-12 MED ORDER — CARVEDILOL 25 MG PO TABS: 50.0000 mg | ORAL_TABLET | Freq: Two times a day (BID) | ORAL | 3 refills | Status: DC

## 2017-04-12 NOTE — Patient Instructions (Signed)
Your physician recommends that you continue on your current medications as directed. Please refer to the Current Medication list given to you today.  Remote monitoring is used to monitor your Pacemaker of ICD from home. This monitoring reduces the number of office visits required to check your device to one time per year. It allows us to keep an eye on the functioning of your device to ensure it is working properly. You are scheduled for a device check from home on 07/12/17. You may send your transmission at any time that day. If you have a wireless device, the transmission will be sent automatically. After your physician reviews your transmission, you will receive a postcard with your next transmission date.  Your physician wants you to follow-up in: 12 months with Dr. Taylor.  You will receive a reminder letter in the mail two months in advance. If you don't receive a letter, please call our office to schedule the follow-up appointment.  

## 2017-04-12 NOTE — Progress Notes (Signed)
HPI April [name] returns today for followup. She is a pleasant 75 yo woman with a h/o VF arrest, s/p ICD implant and severe HTN but mostly white coat HTN. In the interim she has done well except for a little arthritis, especially in her back. She denies any ICD shocks. No chest pain or sob. She checks her blood pressure regularly and at home it has been stable with systolics typically in the 140-150 range. No syncope. Over the past 10 years her blood pressures in our office are always high. She has undergone left hip replacement surgery and has done well. No pain. She states that she may have to get her right hip replaced in the near future.   Allergies  Allergen Reactions  . Codeine Nausea And Vomiting  . Penicillins Hives and Rash    As a child Has patient had a PCN reaction causing immediate rash, facial/tongue/throat swelling, SOB or lightheadedness with hypotension:unsure Has patient had a PCN reaction causing severe rash involving mucus membranes or skin necrosis:unsure Has patient had a PCN reaction that required hospitalization:No Has patient had a PCN reaction occurring within the last 10 years:No If all of the above answers are "NO", then may proceed with Cephalosporin use.      Current Outpatient Prescriptions  Medication Sig Dispense Refill  . acetaminophen (TYLENOL) 500 MG tablet Take 500-1,000 mg by mouth every 6 (six) hours as needed (pain).     Marland Kitchen aspirin EC 325 MG tablet Take 325 mg by mouth at bedtime.    . carvedilol (COREG) 25 MG tablet Take 2 tablets (50 mg total) by mouth 2 (two) times daily with a meal. 360 tablet 3  . diphenhydrAMINE (BENADRYL) 25 MG tablet Take 50 mg by mouth at bedtime as needed for allergies.    Marland Kitchen enalapril (VASOTEC) 10 MG tablet Take 1 tablet by mouth two  times daily 180 tablet 2  . hydrochlorothiazide (HYDRODIURIL) 25 MG tablet Take 1 tablet by mouth  daily 90 tablet 3  . potassium chloride SA (K-DUR,KLOR-CON) 20 MEQ tablet Take 1 tablet by mouth   daily 90 tablet 3   No current facility-administered medications for this visit.      Past Medical History:  Diagnosis Date  . AICD (automatic cardioverter/defibrillator) present   . Arthritis   . HTN (hypertension)   . Myocardial infarction (HCC)    mild Mi 2000  . PONV (postoperative nausea and vomiting)   . Ventricular fibrillation (HCC)     ROS:   All systems reviewed and negative except as noted in the HPI.   Past Surgical History:  Procedure Laterality Date  . IMPLANTABLE CARDIOVERTER DEFIBRILLATOR (ICD) GENERATOR CHANGE N/A 10/14/2014   Procedure: ICD GENERATOR CHANGE;  Surgeon: Marinus Maw, MD;  Location: [facility] CATH LAB;  Service: Cardiovascular;  Laterality: N/A;  . IMPLANTABLE CARDIOVERTER DEFIBRILLATOR IMPLANT  2000; 10-14-2014   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; generator change 09-2014 by Dr Ladona Ridgel  . TOTAL HIP ARTHROPLASTY Left 01/28/2017   Procedure: LEFT TOTAL HIP ARTHROPLASTY ANTERIOR APPROACH;  Surgeon: Kathryne Hitch, MD;  Location: [facility] ORS;  Service: Orthopedics;  Laterality: Left;     Family History  Problem Relation Age of Onset  . Heart attack Mother   . Heart attack Father     x2  . Diabetes Maternal Aunt   . Diabetes Maternal Grandmother   . Heart Problems Maternal Grandmother      Social History   Social History  .  Marital status: Widowed    Spouse name: N/A  . Number of children: N/A  . Years of education: N/A   Occupational History  . Not on file.   Social History Main Topics  . Smoking status: Former Smoker    Quit date: 10/27/1999  . Smokeless tobacco: Never Used  . Alcohol use No  . Drug use: No  . Sexual activity: Not on file   Other Topics Concern  . Not on file   Social History Narrative  . No narrative on file     BP (!) 142/82   Pulse 71   Ht 5' 4.5" (1.638 m)   Wt 151 lb 6.4 oz (68.7 kg)   SpO2 98%   BMI 25.59 kg/m   Physical Exam:  Well appearing 76 yo woman, NAD HEENT:  Unremarkable Neck:  6 cm JVD, no thyromegally Lungs:  Clear with no wheezes, rales, or rhonchi. HEART:  Regular rate rhythm, no murmurs, no rubs, no clicks Abd:  soft, positive bowel sounds, no organomegally, no rebound, no guarding Ext:  2 plus pulses, no edema, no cyanosis, no clubbing Skin:  No rashes no nodules Neuro:  CN II through XII intact, motor grossly intact  DEVICE  Normal device function.  See PaceArt for details.   Assess/Plan:  1. VF arrest - she is stable and had no additional arrhythmias 2. HTN - her blood pressure is better. She has shown me her . However, she notes that at home her pressure is much better.  3. ICD - her medtronic device is working normally. Will recheck in several months. She has 10 years of longevity on the device.  [name]

## 2017-04-18 ENCOUNTER — Telehealth: Payer: Self-pay | Admitting: Internal Medicine

## 2017-04-18 NOTE — Telephone Encounter (Signed)
To Dr. Ladona Ridgel and pharmacy to review alternative.

## 2017-04-18 NOTE — Telephone Encounter (Signed)
Patient calling states that she was unable to get her potassium chloride SA filled with [facility] because they did not have medication. Patient is requesting that we send an alternative for her Potassium Chloride SA to [facility]. Thanks.

## 2017-04-18 NOTE — Telephone Encounter (Signed)
Called [facility] and they said there is no issue filling patient's potassium. Called pt to let her know. No further questions.

## 2017-07-12 ENCOUNTER — Encounter: Payer: 59 | Admitting: *Deleted

## 2017-07-12 ENCOUNTER — Telehealth: Payer: Self-pay | Admitting: Cardiology

## 2017-07-12 NOTE — Telephone Encounter (Signed)
Spoke with pt and reminded pt of remote transmission that is due today. Pt verbalized understanding.   

## 2017-07-14 ENCOUNTER — Encounter: Payer: Self-pay | Admitting: Cardiology

## 2017-07-18 ENCOUNTER — Other Ambulatory Visit: Payer: Self-pay | Admitting: Internal Medicine

## 2017-07-18 DIAGNOSIS — Z95810 Presence of automatic (implantable) cardiac defibrillator: Secondary | ICD-10-CM

## 2017-07-19 ENCOUNTER — Ambulatory Visit (INDEPENDENT_AMBULATORY_CARE_PROVIDER_SITE_OTHER): Payer: 59 | Admitting: *Deleted

## 2017-07-19 DIAGNOSIS — I4901 Ventricular fibrillation: Secondary | ICD-10-CM | POA: Diagnosis not present

## 2017-07-20 ENCOUNTER — Telehealth: Payer: Self-pay | Admitting: Internal Medicine

## 2017-07-20 DIAGNOSIS — I4901 Ventricular fibrillation: Secondary | ICD-10-CM

## 2017-07-20 DIAGNOSIS — I10 Essential (primary) hypertension: Secondary | ICD-10-CM

## 2017-07-20 NOTE — Progress Notes (Signed)
Remote ICD transmission.   

## 2017-07-20 NOTE — Telephone Encounter (Signed)
Please refer to our lipid clinic. GT

## 2017-07-20 NOTE — Telephone Encounter (Signed)
Patient called to report her [facility] wellness program informed her that her triglycerides are elevated and Coreg is the cause.  They drew her labs recently and her triglycerides were 833. Asked the patient to request they send a copy to the office for review. She will try. She states she follows a low cholesterol diet and her TAGs have only increased. Last year they were in the 500s.  The patient does not have a PCP. Her only cardiologist is Dr. Ladona Ridgel.  Reiterated the importance of finding a PCP to manage cholesterol. Also reiterated to patient that Coreg is most likely not the cause of her elevated labs.  She requests Dr. Ladona Ridgel help come up with a plan for triglyceride lowering therapies in the meantime prior to getting PCP.

## 2017-07-20 NOTE — Telephone Encounter (Signed)
New message    Pt is calling wanting to talk about one of her medications.

## 2017-07-20 NOTE — Telephone Encounter (Signed)
New message     Pt is calling asking for a call back. She has a question about one of her medications.

## 2017-07-21 ENCOUNTER — Encounter: Payer: Self-pay | Admitting: Cardiology

## 2017-07-21 NOTE — Telephone Encounter (Signed)
I'll let you schedule this one at [facility], not sure why Dr. Ladona Ridgel sent to me

## 2017-07-21 NOTE — Telephone Encounter (Signed)
Per Lipid Clinic, patient will need fasting labs drawn prior to scheduling OV. Scheduled patient for fasting lab work Thursday, Aug 2. Scheduled patient for Lipid Clinic OV Friday, August 3. Patient agrees with treatment plan and was grateful for call.

## 2017-07-28 ENCOUNTER — Other Ambulatory Visit: Payer: 59 | Admitting: *Deleted

## 2017-07-28 DIAGNOSIS — I4901 Ventricular fibrillation: Secondary | ICD-10-CM

## 2017-07-28 DIAGNOSIS — I10 Essential (primary) hypertension: Secondary | ICD-10-CM

## 2017-07-28 LAB — LIPID PANEL
CHOLESTEROL TOTAL: 222 mg/dL — AB (ref 100–199)
Chol/HDL Ratio: 6.3 ratio — ABNORMAL HIGH (ref 0.0–4.4)
HDL: 35 mg/dL — ABNORMAL LOW (ref >39)
LDL Calculated: 121 mg/dL — ABNORMAL HIGH (ref 0–99)
Triglycerides: 331 mg/dL — ABNORMAL HIGH (ref 0–149)
VLDL Cholesterol Cal: 66 mg/dL — ABNORMAL HIGH (ref 5–40)

## 2017-07-28 LAB — HEPATIC FUNCTION PANEL
ALBUMIN: 4.5 g/dL (ref 3.5–4.8)
ALK PHOS: 100 IU/L (ref 39–117)
ALT: 9 IU/L (ref 0–32)
AST: 15 IU/L (ref 0–40)
BILIRUBIN TOTAL: 0.3 mg/dL (ref 0.0–1.2)
BILIRUBIN, DIRECT: 0.06 mg/dL (ref 0.00–0.40)
Total Protein: 6.9 g/dL (ref 6.0–8.5)

## 2017-07-29 ENCOUNTER — Ambulatory Visit (INDEPENDENT_AMBULATORY_CARE_PROVIDER_SITE_OTHER): Payer: 59 | Admitting: Pharmacist

## 2017-07-29 DIAGNOSIS — E785 Hyperlipidemia, unspecified: Secondary | ICD-10-CM | POA: Diagnosis not present

## 2017-07-29 MED ORDER — ROSUVASTATIN CALCIUM 10 MG PO TABS: 10.0000 mg | ORAL_TABLET | Freq: Every day | ORAL | 3 refills | Status: DC

## 2017-07-29 NOTE — Progress Notes (Signed)
Patient ID: April [name]                 DOB: [birth year]                    MRN: [id number]     HPI: April [name] is a 76 y.o. female patient referred to lipid clinic by Dr. Ladona Ridgel. 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 Pt reported that her [facility] wellness program informed her that her TG were elevated around 833 at the time and advised pt it was because of her carvedilol. Most recent fasting lipid panel completed on 07/28/17 showed elevated TG at 331 mg/dL and LDL at 409 mg/dL. Most recent hepatic panel indicated levels were within normal. Pt does not have a PCP.    Pt presented in clinic in good spirits. Pt reports that she has never tried any cholesterol medications before. Pt tries her best to eat as healthy as possible and exercise as frequently as she can. Discussed that carvedilol should not be the cause of elevated TG in the 300s. She limits sugars and carbs in her diet and does not drink alcohol.  Current Medications: none Intolerances: none Risk Factors: HTN, family hx of heart disease LDL goal: < 100 mg/dL for primary prevention with family hx TG goal: <150 mg/dL   Diet: Drink mostly water, does not consume caffeine. Drinks low sugar/calorie peach tea, 2 packs daily. Enjoys eating healthy. Eats lots of veggies like spinach and cabbage as well as fruits. Eats a lot of fish and chicken as opposed to red meats. Limited intake of carbs, eats pasta about once a month. She is trying to cut back on refined sugars. Looks for low fat dairy products.  Exercise: Walked frequently, about 5 miles daily before her hip procedure. Plans to resume regimen once her hip is better.    Family History: Mother - heart attack; father - heart attack x 2; maternal aunt - diabetes; maternal grandmother - diabetes,   Social History: No alcohol consumption, non smoker.    Labs: 07/28/17: TC 222, TG 331, HDL 35, LDL  121 (no medications)  Past Medical History:  Diagnosis Date  . AICD (automatic cardioverter/defibrillator) present   . Arthritis   . HTN (hypertension)   . Myocardial infarction (HCC)    mild Mi 2000  . PONV (postoperative nausea and vomiting)   . Ventricular fibrillation [facility])     Current Outpatient Prescriptions on File Prior to Visit  Medication Sig Dispense Refill  . acetaminophen (TYLENOL) 500 MG tablet Take 500-1,000 mg by mouth every 6 (six) hours as needed (pain).     Marland Kitchen aspirin EC 325 MG tablet Take 325 mg by mouth at bedtime.    . carvedilol (COREG) 25 MG tablet Take 2 tablets (50 mg total) by mouth 2 (two) times daily with a meal. 360 tablet 3  . diphenhydrAMINE (BENADRYL) 25 MG tablet Take 50 mg by mouth at bedtime as needed for allergies.    Marland Kitchen enalapril (VASOTEC) 10 MG tablet TAKE 1 TABLET BY MOUTH TWO  TIMES DAILY 180 tablet 2  . hydrochlorothiazide (HYDRODIURIL) 25 MG tablet Take 1 tablet by mouth  daily 90 tablet 3  . potassium chloride SA (K-DUR,KLOR-CON) 20 MEQ tablet Take 1 tablet by mouth  daily 90 tablet 3   No current facility-administered medications on file prior to visit.     Allergies  Allergen Reactions  .  Codeine Nausea And Vomiting  . Penicillins Hives and Rash    As a child Has patient had a PCN reaction causing immediate rash, facial/tongue/throat swelling, SOB or lightheadedness with hypotension:unsure Has patient had a PCN reaction causing severe rash involving mucus membranes or skin necrosis:unsure Has patient had a PCN reaction that required hospitalization:No Has patient had a PCN reaction occurring within the last 10 years:No If all of the above answers are "NO", then may proceed with Cephalosporin use.     Assessment/Plan:  1. Hyperlipidemia - Based on lipid panel drawn yesterday, LDL is above goal <100 mg/dL and TG above goal <237 mg/dL on no lipid-lowering therapy. Will start rosuvastatin 10 mg daily and fish oil 2,000 mg daily.  Encouraged pt to continue to eat a diet low in saturated fats, sugars, and carbohydrates. Will repeat fasting lipid panel in 3 months and f/u with pt in clinic as needed.   -Durward Mallard, PharmD Student   Aixa Corsello E. Mahek Schlesinger, PharmD, CPP, BCACP [facility] [address], [place], [place] [address] Phone: [phone]; Fax: [phone] 07/29/2017 12:23 PM

## 2017-07-29 NOTE — Patient Instructions (Signed)
It was nice meeting you today!  To help lower your cholesterol levels, start a new medication called Crestor (rosuvastatin) 10 mg by mouth 1 tablet daily.   In addition to help lower your triglyceride levels, start fish oil 1,000 mg by mouth 2 capsules daily which you can find over the counter.    We will recheck your fasting cholesterol numbers in 3 months, Tuesday Nov. 6th. We will follow up with you in clinic as needed.

## 2017-08-01 ENCOUNTER — Telehealth: Payer: Self-pay

## 2017-08-01 NOTE — Telephone Encounter (Signed)
error 

## 2017-08-09 ENCOUNTER — Other Ambulatory Visit: Payer: Self-pay | Admitting: Internal Medicine

## 2017-08-09 ENCOUNTER — Telehealth: Payer: Self-pay | Admitting: Internal Medicine

## 2017-08-09 MED ORDER — ROSUVASTATIN CALCIUM 10 MG PO TABS: 10.0000 mg | ORAL_TABLET | Freq: Every day | ORAL | 0 refills | Status: DC

## 2017-08-09 NOTE — Addendum Note (Signed)
Addended by: Demetrios Loll on: 08/09/2017 04:11 PM   Modules accepted: Orders

## 2017-08-09 NOTE — Telephone Encounter (Signed)
New message    Pt is calling stating she missed a call from someone and is calling back.

## 2017-08-09 NOTE — Telephone Encounter (Signed)
Pt's medication was sent to pt's pharmacy as requested. A 30 day supply was sent to local pharmacy until mail order arrives. Confirmation received.

## 2017-08-09 NOTE — Telephone Encounter (Signed)
°*  STAT* If patient is at the pharmacy, call can be transferred to refill team.   1. Which medications need to be refilled? (please list name of each medication and dose if known) Rosuvastatin tablets 10mg    2. Which pharmacy/location (including street and city if local pharmacy) is medication to be sent to?[facility]  3. Do they need a 30 day or 90 day supply?30

## 2017-08-10 NOTE — Telephone Encounter (Signed)
Return call received from Pt after this nurse attempted to contact Pt.  Pt notified that refill dept attempted to call Pt to notify refill had been submitted.  No further action needed.

## 2017-08-19 LAB — CUP PACEART REMOTE DEVICE CHECK
HighPow Impedance: 46 Ohm
HighPow Impedance: 58 Ohm
Implantable Lead Implant Date: 20000821
Implantable Lead Location: 753860
Implantable Pulse Generator Implant Date: 20151019
Lead Channel Pacing Threshold Amplitude: 0.75 V
Lead Channel Sensing Intrinsic Amplitude: 6.125 mV
Lead Channel Setting Sensing Sensitivity: 0.3 mV
MDC IDC MSMT BATTERY REMAINING LONGEVITY: 120 mo
MDC IDC MSMT BATTERY VOLTAGE: 3.01 V
MDC IDC MSMT LEADCHNL RV IMPEDANCE VALUE: 399 Ohm
MDC IDC MSMT LEADCHNL RV IMPEDANCE VALUE: 399 Ohm
MDC IDC MSMT LEADCHNL RV PACING THRESHOLD PULSEWIDTH: 0.4 ms
MDC IDC MSMT LEADCHNL RV SENSING INTR AMPL: 6.125 mV
MDC IDC SESS DTM: 20180724132204
MDC IDC SET LEADCHNL RV PACING AMPLITUDE: 2 V
MDC IDC SET LEADCHNL RV PACING PULSEWIDTH: 0.4 ms
MDC IDC STAT BRADY RV PERCENT PACED: 0.01 %

## 2017-09-14 ENCOUNTER — Ambulatory Visit (INDEPENDENT_AMBULATORY_CARE_PROVIDER_SITE_OTHER): Payer: 59 | Admitting: Orthopaedic Surgery

## 2017-09-14 ENCOUNTER — Ambulatory Visit (INDEPENDENT_AMBULATORY_CARE_PROVIDER_SITE_OTHER): Payer: 59

## 2017-09-14 DIAGNOSIS — M25551 Pain in right hip: Secondary | ICD-10-CM | POA: Diagnosis not present

## 2017-09-14 DIAGNOSIS — M1611 Unilateral primary osteoarthritis, right hip: Secondary | ICD-10-CM

## 2017-09-14 NOTE — Progress Notes (Signed)
The patient is well-known to me. She is 8 months out from a left total hip arthroplasty through direct injury approach is done quite well with that. Her right hip has severe daily pain. It is 10 out of 10 pain. It is detrimentally affected activities daily living, her quality of life, and her mobility. X-rays in the office today show severe end-stage arthritis. She has complete loss of her joint space throughout the entire right hip. They're para-articular osteophytes and sclerotic changes as well as cystic changes in the femoral head and acetabulum. Her left total hip arthroplasty appears well seated.  On exam her left hip moves fluidly normally without any pain at all. The right hip has severe and significant pain on any attempts of internal or external rotation. Her leg lengths are equal. She walks with a significant Trendelenburg gait. At this point given the failure of all forms of physical therapy, anti-inflammatories, injections and other conservative treatment modalities we are recommending a replacement on the right side. This is been well documented in the office visit some he notes that the fact she has severe bilateral hip arthritis arthritis. Along the third discussion of the risks and minutes of surgery were had with her and having had this before and done so successful with this she is ready to have the surgery done. She would like to have this done November 16. We'll work on getting that scheduled. All questions and concerns were answered and addressed. We'll see her back 2 weeks postoperatively after her November 16 surgery.

## 2017-09-20 ENCOUNTER — Telehealth (INDEPENDENT_AMBULATORY_CARE_PROVIDER_SITE_OTHER): Payer: Self-pay | Admitting: Orthopaedic Surgery

## 2017-09-20 NOTE — Telephone Encounter (Signed)
Patient called asking to set up surgery with Dr. Magnus Ivan. CB # [phone]

## 2017-09-23 NOTE — Telephone Encounter (Signed)
I called patient.  She will call me back next week to schedule.

## 2017-10-25 ENCOUNTER — Encounter: Payer: 59 | Admitting: *Deleted

## 2017-10-25 NOTE — Progress Notes (Signed)
Please place orders in EPIC as patient is being scheduled for a pre-op appointment! Thank you! 

## 2017-10-27 ENCOUNTER — Other Ambulatory Visit (INDEPENDENT_AMBULATORY_CARE_PROVIDER_SITE_OTHER): Payer: Self-pay | Admitting: Physician Assistant

## 2017-10-28 ENCOUNTER — Encounter: Payer: Self-pay | Admitting: Cardiology

## 2017-11-01 ENCOUNTER — Other Ambulatory Visit: Payer: 59 | Admitting: *Deleted

## 2017-11-01 DIAGNOSIS — E785 Hyperlipidemia, unspecified: Secondary | ICD-10-CM

## 2017-11-01 LAB — HEPATIC FUNCTION PANEL
ALT: 10 IU/L (ref 0–32)
AST: 16 IU/L (ref 0–40)
Albumin: 4.8 g/dL (ref 3.5–4.8)
Alkaline Phosphatase: 102 IU/L (ref 39–117)
Bilirubin Total: 0.3 mg/dL (ref 0.0–1.2)
Bilirubin, Direct: 0.11 mg/dL (ref 0.00–0.40)
Total Protein: 7.3 g/dL (ref 6.0–8.5)

## 2017-11-01 LAB — LIPID PANEL
CHOLESTEROL TOTAL: 123 mg/dL (ref 100–199)
Chol/HDL Ratio: 2.9 ratio (ref 0.0–4.4)
HDL: 42 mg/dL (ref >39)
LDL CALC: 41 mg/dL (ref 0–99)
TRIGLYCERIDES: 200 mg/dL — AB (ref 0–149)
VLDL CHOLESTEROL CAL: 40 mg/dL (ref 5–40)

## 2017-11-02 NOTE — Patient Instructions (Signed)
Ravin Vanderploeg  11/02/2017   Your procedure is scheduled on: 11/11/2017    Report to [facility] Main  Entrance .  Report to admitting at   0645 AM   Call this number if you have problems the morning of surgery  [phone]   Remember: ONLY 1 PERSON MAY GO WITH YOU TO SHORT STAY TO GET  READY MORNING OF YOUR SURGERY.  Do not eat food or drink liquids :After Midnight.     Take these medicines the morning of surgery with A SIP OF WATER: Carvedilol ( Coreg)                                You may not have any metal on your body including hair pins and              piercings  Do not wear jewelry, make-up, lotions, powders or perfumes, deodorant             Do not wear nail polish.  Do not shave  48 hours prior to surgery.                Do not bring valuables to the hospital. [facility] IS NOT             RESPONSIBLE   FOR VALUABLES.  Contacts, dentures or bridgework may not be worn into surgery.  Leave suitcase in the car. After surgery it may be brought to your room.                     Please read over the following fact sheets you were given: _____________________________________________________________________             [facility] - Preparing for Surgery Before surgery, you can play an important role.  Because skin is not sterile, your skin needs to be as free of germs as possible.  You can reduce the number of germs on your skin by washing with CHG (chlorahexidine gluconate) soap before surgery.  CHG is an antiseptic cleaner which kills germs and bonds with the skin to continue killing germs even after washing. Please DO NOT use if you have an allergy to CHG or antibacterial soaps.  If your skin becomes reddened/irritated stop using the CHG and inform your nurse when you arrive at Short Stay. Do not shave (including legs and underarms) for at least 48 hours prior to the first CHG shower.  You may shave your face/neck. Please follow these  instructions carefully:  1.  Shower with CHG Soap the night before surgery and the  morning of Surgery.  2.  If you choose to wash your hair, wash your hair first as usual with your  normal  shampoo.  3.  After you shampoo, rinse your hair and body thoroughly to remove the  shampoo.                           4.  Use CHG as you would any other liquid soap.  You can apply chg directly  to the skin and wash                       Gently with a scrungie or clean washcloth.  5.  Apply the CHG Soap to your  body ONLY FROM THE NECK DOWN.   Do not use on face/ open                           Wound or open sores. Avoid contact with eyes, ears mouth and genitals (private parts).                       Wash face,  Genitals (private parts) with your normal soap.             6.  Wash thoroughly, paying special attention to the area where your surgery  will be performed.  7.  Thoroughly rinse your body with warm water from the neck down.  8.  DO NOT shower/wash with your normal soap after using and rinsing off  the CHG Soap.                9.  Pat yourself dry with a clean towel.            10.  Wear clean pajamas.            11.  Place clean sheets on your bed the night of your first shower and do not  sleep with pets. Day of Surgery : Do not apply any lotions/deodorants the morning of surgery.  Please wear clean clothes to the hospital/surgery center.  FAILURE TO FOLLOW THESE INSTRUCTIONS MAY RESULT IN THE CANCELLATION OF YOUR SURGERY PATIENT SIGNATURE_________________________________  NURSE SIGNATURE__________________________________  ________________________________________________________________________  WHAT IS A BLOOD TRANSFUSION? Blood Transfusion Information  A transfusion is the replacement of blood or some of its parts. Blood is made up of multiple cells which provide different functions.  Red blood cells carry oxygen and are used for blood loss replacement.  White blood cells fight against  infection.  Platelets control bleeding.  Plasma helps clot blood.  Other blood products are available for specialized needs, such as hemophilia or other clotting disorders. BEFORE THE TRANSFUSION  Who gives blood for transfusions?   Healthy volunteers who are fully evaluated to make sure their blood is safe. This is blood bank blood. Transfusion therapy is the safest it has ever been in the practice of medicine. Before blood is taken from a donor, a complete history is taken to make sure that person has no history of diseases nor engages in risky social behavior (examples are intravenous drug use or sexual activity with multiple partners). The donor's travel history is screened to minimize risk of transmitting infections, such as malaria. The donated blood is tested for signs of infectious diseases, such as HIV and hepatitis. The blood is then tested to be sure it is compatible with you in order to minimize the chance of a transfusion reaction. If you or a relative donates blood, this is often done in anticipation of surgery and is not appropriate for emergency situations. It takes many days to process the donated blood. RISKS AND COMPLICATIONS Although transfusion therapy is very safe and saves many lives, the main dangers of transfusion include:   Getting an infectious disease.  Developing a transfusion reaction. This is an allergic reaction to something in the blood you were given. Every precaution is taken to prevent this. The decision to have a blood transfusion has been considered carefully by your caregiver before blood is given. Blood is not given unless the benefits outweigh the risks. AFTER THE TRANSFUSION  Right after receiving a blood transfusion, you will usually feel  much better and more energetic. This is especially true if your red blood cells have gotten low (anemic). The transfusion raises the level of the red blood cells which carry oxygen, and this usually causes an energy  increase.  The nurse administering the transfusion will monitor you carefully for complications. HOME CARE INSTRUCTIONS  No special instructions are needed after a transfusion. You may find your energy is better. Speak with your caregiver about any limitations on activity for underlying diseases you may have. SEEK MEDICAL CARE IF:   Your condition is not improving after your transfusion.  You develop redness or irritation at the intravenous (IV) site. SEEK IMMEDIATE MEDICAL CARE IF:  Any of the following symptoms occur over the next 12 hours:  Shaking chills.  You have a temperature by mouth above 102 F (38.9 C), not controlled by medicine.  Chest, back, or muscle pain.  People around you feel you are not acting correctly or are confused.  Shortness of breath or difficulty breathing.  Dizziness and fainting.  You get a rash or develop hives.  You have a decrease in urine output.  Your urine turns a dark color or changes to pink, red, or brown. Any of the following symptoms occur over the next 10 days:  You have a temperature by mouth above 102 F (38.9 C), not controlled by medicine.  Shortness of breath.  Weakness after normal activity.  The white part of the eye turns yellow (jaundice).  You have a decrease in the amount of urine or are urinating less often.  Your urine turns a dark color or changes to pink, red, or brown. Document Released: 12/10/2000 Document Revised: 03/06/2012 Document Reviewed: 07/29/2008 ExitCare Patient Information 2014 [place].  _______________________________________________________________________  Incentive Spirometer  An incentive spirometer is a tool that can help keep your lungs clear and active. This tool measures how well you are filling your lungs with each breath. Taking long deep breaths may help reverse or decrease the chance of developing breathing (pulmonary) problems (especially infection) following:  A long  period of time when you are unable to move or be active. BEFORE THE PROCEDURE   If the spirometer includes an indicator to show your best effort, your nurse or respiratory therapist will set it to a desired goal.  If possible, sit up straight or lean slightly forward. Try not to slouch.  Hold the incentive spirometer in an upright position. INSTRUCTIONS FOR USE  1. Sit on the edge of your bed if possible, or sit up as far as you can in bed or on a chair. 2. Hold the incentive spirometer in an upright position. 3. Breathe out normally. 4. Place the mouthpiece in your mouth and seal your lips tightly around it. 5. Breathe in slowly and as deeply as possible, raising the piston or the ball toward the top of the column. 6. Hold your breath for 3-5 seconds or for as long as possible. Allow the piston or ball to fall to the bottom of the column. 7. Remove the mouthpiece from your mouth and breathe out normally. 8. Rest for a few seconds and repeat Steps 1 through 7 at least 10 times every 1-2 hours when you are awake. Take your time and take a few normal breaths between deep breaths. 9. The spirometer may include an indicator to show your best effort. Use the indicator as a goal to work toward during each repetition. 10. After each set of 10 deep breaths, practice coughing to be  sure your lungs are clear. If you have an incision (the cut made at the time of surgery), support your incision when coughing by placing a pillow or rolled up towels firmly against it. Once you are able to get out of bed, walk around indoors and cough well. You may stop using the incentive spirometer when instructed by your caregiver.  RISKS AND COMPLICATIONS  Take your time so you do not get dizzy or light-headed.  If you are in pain, you may need to take or ask for pain medication before doing incentive spirometry. It is harder to take a deep breath if you are having pain. AFTER USE  Rest and breathe slowly and  easily.  It can be helpful to keep track of a log of your progress. Your caregiver can provide you with a simple table to help with this. If you are using the spirometer at home, follow these instructions: [place] IF:   You are having difficultly using the spirometer.  You have trouble using the spirometer as often as instructed.  Your pain medication is not giving enough relief while using the spirometer.  You develop fever of 100.5 F (38.1 C) or higher. SEEK IMMEDIATE MEDICAL CARE IF:   You cough up bloody sputum that had not been present before.  You develop fever of 102 F (38.9 C) or greater.  You develop worsening pain at or near the incision site. MAKE SURE YOU:   Understand these instructions.  Will watch your condition.  Will get help right away if you are not doing well or get worse. Document Released: 04/25/2007 Document Revised: 03/06/2012 Document Reviewed: 06/26/2007 [facility] Patient Information 2014 [place], [place].   ________________________________________________________________________

## 2017-11-03 ENCOUNTER — Encounter (HOSPITAL_COMMUNITY)
Admission: RE | Admit: 2017-11-03 | Discharge: 2017-11-03 | Disposition: A | Discharge status: Home / Self Care | Payer: 59 | Source: Ambulatory Visit | Attending: Orthopaedic Surgery | Admitting: Orthopaedic Surgery

## 2017-11-03 ENCOUNTER — Encounter (HOSPITAL_COMMUNITY): Payer: Self-pay

## 2017-11-03 ENCOUNTER — Other Ambulatory Visit: Payer: Self-pay

## 2017-11-03 DIAGNOSIS — Z01810 Encounter for preprocedural cardiovascular examination: Secondary | ICD-10-CM | POA: Diagnosis not present

## 2017-11-03 DIAGNOSIS — I517 Cardiomegaly: Secondary | ICD-10-CM | POA: Insufficient documentation

## 2017-11-03 DIAGNOSIS — M1611 Unilateral primary osteoarthritis, right hip: Secondary | ICD-10-CM | POA: Insufficient documentation

## 2017-11-03 DIAGNOSIS — Z01818 Encounter for other preprocedural examination: Secondary | ICD-10-CM | POA: Diagnosis not present

## 2017-11-03 DIAGNOSIS — I498 Other specified cardiac arrhythmias: Secondary | ICD-10-CM | POA: Diagnosis not present

## 2017-11-03 LAB — CBC
HEMATOCRIT: 35.7 % — AB (ref 36.0–46.0)
HEMOGLOBIN: 11.1 g/dL — AB (ref 12.0–15.0)
MCH: 28.5 pg (ref 26.0–34.0)
MCHC: 31.1 g/dL (ref 30.0–36.0)
MCV: 91.8 fL (ref 78.0–100.0)
Platelets: 190 10*3/uL (ref 150–400)
RBC: 3.89 MIL/uL (ref 3.87–5.11)
RDW: 14.7 % (ref 11.5–15.5)
WBC: 5.4 10*3/uL (ref 4.0–10.5)

## 2017-11-03 LAB — BASIC METABOLIC PANEL
ANION GAP: 9 (ref 5–15)
BUN: 25 mg/dL — ABNORMAL HIGH (ref 6–20)
CALCIUM: 9.4 mg/dL (ref 8.9–10.3)
CHLORIDE: 105 mmol/L (ref 101–111)
CO2: 27 mmol/L (ref 22–32)
Creatinine, Ser: 0.8 mg/dL (ref 0.44–1.00)
GFR calc non Af Amer: >60 mL/min (ref >60)
GLUCOSE: 116 mg/dL — AB (ref 65–99)
Potassium: 4.9 mmol/L (ref 3.5–5.1)
Sodium: 141 mmol/L (ref 135–145)

## 2017-11-03 LAB — SURGICAL PCR SCREEN
MRSA, PCR: NEGATIVE
STAPHYLOCOCCUS AUREUS: NEGATIVE

## 2017-11-03 NOTE — Progress Notes (Signed)
Final EKG done 11/03/17-epic  

## 2017-11-03 NOTE — Progress Notes (Signed)
LOV- 04/12/17-Dr Ladona Ridgel- epic  08/19/17-Last Device Check- epic

## 2017-11-03 NOTE — Progress Notes (Signed)
BMP done 11/03/17 faxed via epic to Dr Allie Bossier.

## 2017-11-04 ENCOUNTER — Telehealth: Payer: Self-pay | Admitting: Cardiology

## 2017-11-04 NOTE — Telephone Encounter (Signed)
Spoke with pt, pt stated that she thought she had sent the transmission in correctly, informed pt that we did not receive the transmission asked of pt is near her monitor pt stated that she was at work and that she was going out of town and wouldn't be back until Tuesday, pt stated that she would call back Tuesday so someone could help walk her through a transmission.

## 2017-11-04 NOTE — Telephone Encounter (Signed)
Follow Up:     Please call,pt said she had received a letter.

## 2017-11-08 ENCOUNTER — Ambulatory Visit (INDEPENDENT_AMBULATORY_CARE_PROVIDER_SITE_OTHER): Payer: 59 | Admitting: *Deleted

## 2017-11-08 DIAGNOSIS — I4901 Ventricular fibrillation: Secondary | ICD-10-CM | POA: Diagnosis not present

## 2017-11-09 NOTE — Progress Notes (Signed)
Remote ICD transmission.   

## 2017-11-10 ENCOUNTER — Encounter (HOSPITAL_COMMUNITY): Payer: Self-pay | Admitting: Anesthesiology

## 2017-11-10 LAB — CUP PACEART REMOTE DEVICE CHECK
Brady Statistic RV Percent Paced: 0.01 %
HIGH POWER IMPEDANCE MEASURED VALUE: 43 Ohm
HighPow Impedance: 53 Ohm
Implantable Lead Implant Date: 20000821
Implantable Lead Location: 753860
Implantable Lead Model: 6942
Lead Channel Impedance Value: 399 Ohm
Lead Channel Pacing Threshold Amplitude: 0.625 V
Lead Channel Sensing Intrinsic Amplitude: 7 mV
Lead Channel Setting Sensing Sensitivity: 0.3 mV
MDC IDC MSMT BATTERY REMAINING LONGEVITY: 115 mo
MDC IDC MSMT BATTERY VOLTAGE: 3.01 V
MDC IDC MSMT LEADCHNL RV IMPEDANCE VALUE: 361 Ohm
MDC IDC MSMT LEADCHNL RV PACING THRESHOLD PULSEWIDTH: 0.4 ms
MDC IDC MSMT LEADCHNL RV SENSING INTR AMPL: 7 mV
MDC IDC PG IMPLANT DT: 20151019
MDC IDC SESS DTM: 20181113214326
MDC IDC SET LEADCHNL RV PACING AMPLITUDE: 2 V
MDC IDC SET LEADCHNL RV PACING PULSEWIDTH: 0.4 ms

## 2017-11-11 ENCOUNTER — Encounter (HOSPITAL_COMMUNITY): Payer: Self-pay

## 2017-11-11 ENCOUNTER — Other Ambulatory Visit: Payer: Self-pay

## 2017-11-11 ENCOUNTER — Inpatient Hospital Stay (HOSPITAL_COMMUNITY): Payer: 59

## 2017-11-11 ENCOUNTER — Encounter: Payer: Self-pay | Admitting: Cardiology

## 2017-11-11 ENCOUNTER — Inpatient Hospital Stay (HOSPITAL_COMMUNITY): Payer: 59 | Admitting: Anesthesiology

## 2017-11-11 ENCOUNTER — Encounter (HOSPITAL_COMMUNITY)
Admission: RE | Disposition: A | Discharge status: Home Health | Payer: Self-pay | Source: Ambulatory Visit | Attending: Orthopaedic Surgery

## 2017-11-11 ENCOUNTER — Inpatient Hospital Stay (HOSPITAL_COMMUNITY)
Admission: RE | Admit: 2017-11-11 | Discharge: 2017-11-14 | DRG: 470 | Disposition: A | Discharge status: Home Health | Payer: 59 | Source: Ambulatory Visit | Attending: Orthopaedic Surgery | Admitting: Orthopaedic Surgery

## 2017-11-11 DIAGNOSIS — R110 Nausea: Secondary | ICD-10-CM | POA: Diagnosis not present

## 2017-11-11 DIAGNOSIS — M1611 Unilateral primary osteoarthritis, right hip: Principal | ICD-10-CM | POA: Diagnosis present

## 2017-11-11 DIAGNOSIS — Z885 Allergy status to narcotic agent status: Secondary | ICD-10-CM | POA: Diagnosis not present

## 2017-11-11 DIAGNOSIS — Z79899 Other long term (current) drug therapy: Secondary | ICD-10-CM

## 2017-11-11 DIAGNOSIS — Z87891 Personal history of nicotine dependence: Secondary | ICD-10-CM | POA: Diagnosis not present

## 2017-11-11 DIAGNOSIS — Z7982 Long term (current) use of aspirin: Secondary | ICD-10-CM

## 2017-11-11 DIAGNOSIS — Z95810 Presence of automatic (implantable) cardiac defibrillator: Secondary | ICD-10-CM

## 2017-11-11 DIAGNOSIS — E785 Hyperlipidemia, unspecified: Secondary | ICD-10-CM | POA: Diagnosis present

## 2017-11-11 DIAGNOSIS — Z96642 Presence of left artificial hip joint: Secondary | ICD-10-CM | POA: Diagnosis present

## 2017-11-11 DIAGNOSIS — Z880 Allergy status to penicillin: Secondary | ICD-10-CM | POA: Diagnosis not present

## 2017-11-11 DIAGNOSIS — Z96641 Presence of right artificial hip joint: Secondary | ICD-10-CM

## 2017-11-11 DIAGNOSIS — M25551 Pain in right hip: Secondary | ICD-10-CM | POA: Diagnosis not present

## 2017-11-11 DIAGNOSIS — Z471 Aftercare following joint replacement surgery: Secondary | ICD-10-CM | POA: Diagnosis not present

## 2017-11-11 DIAGNOSIS — I252 Old myocardial infarction: Secondary | ICD-10-CM | POA: Diagnosis not present

## 2017-11-11 DIAGNOSIS — I10 Essential (primary) hypertension: Secondary | ICD-10-CM | POA: Diagnosis present

## 2017-11-11 DIAGNOSIS — Z419 Encounter for procedure for purposes other than remedying health state, unspecified: Secondary | ICD-10-CM

## 2017-11-11 DIAGNOSIS — D649 Anemia, unspecified: Secondary | ICD-10-CM | POA: Diagnosis not present

## 2017-11-11 HISTORY — PX: TOTAL HIP ARTHROPLASTY: SHX124

## 2017-11-11 LAB — TYPE AND SCREEN
ABO/RH(D): B POS
Antibody Screen: NEGATIVE

## 2017-11-11 SURGERY — ARTHROPLASTY, HIP, TOTAL, ANTERIOR APPROACH
Anesthesia: Monitor Anesthesia Care | Site: Hip | Laterality: Right

## 2017-11-11 MED ORDER — BUPIVACAINE IN DEXTROSE 0.75-8.25 % IT SOLN
INTRATHECAL | Status: DC | PRN
  Administered 2017-11-11: 2 mL via INTRATHECAL

## 2017-11-11 MED ORDER — DIPHENHYDRAMINE HCL 12.5 MG/5ML PO ELIX: 12.5000 mg | ORAL_SOLUTION | ORAL | Status: DC | PRN

## 2017-11-11 MED ORDER — METOCLOPRAMIDE HCL 5 MG/ML IJ SOLN
5.0000 mg | Freq: Three times a day (TID) | INTRAMUSCULAR | Status: DC | PRN
  Administered 2017-11-11 – 2017-11-13 (×2): 10 mg via INTRAVENOUS
  Filled 2017-11-11 (×2): qty 2

## 2017-11-11 MED ORDER — CLINDAMYCIN PHOSPHATE 600 MG/50ML IV SOLN
600.0000 mg | Freq: Four times a day (QID) | INTRAVENOUS | Status: AC
  Administered 2017-11-11 (×2): 600 mg via INTRAVENOUS
  Filled 2017-11-11 (×2): qty 50

## 2017-11-11 MED ORDER — METHOCARBAMOL 500 MG PO TABS
500.0000 mg | ORAL_TABLET | Freq: Four times a day (QID) | ORAL | Status: DC | PRN
  Administered 2017-11-11 – 2017-11-12 (×2): 500 mg via ORAL
  Filled 2017-11-11 (×3): qty 1

## 2017-11-11 MED ORDER — ALUM & MAG HYDROXIDE-SIMETH 200-200-20 MG/5ML PO SUSP: 30.0000 mL | ORAL | Status: DC | PRN

## 2017-11-11 MED ORDER — ONDANSETRON HCL 4 MG PO TABS
4.0000 mg | ORAL_TABLET | Freq: Four times a day (QID) | ORAL | Status: DC | PRN
  Administered 2017-11-11 – 2017-11-14 (×5): 4 mg via ORAL
  Filled 2017-11-11 (×6): qty 1

## 2017-11-11 MED ORDER — HYDROCODONE-ACETAMINOPHEN 5-325 MG PO TABS
1.0000 | ORAL_TABLET | ORAL | Status: DC | PRN
  Administered 2017-11-11 – 2017-11-12 (×3): 1 via ORAL
  Filled 2017-11-11 (×2): qty 1
  Filled 2017-11-11: qty 2

## 2017-11-11 MED ORDER — ONDANSETRON HCL 4 MG/2ML IJ SOLN
INTRAMUSCULAR | Status: DC | PRN
  Administered 2017-11-11: 4 mg via INTRAVENOUS

## 2017-11-11 MED ORDER — LACTATED RINGERS IV SOLN
INTRAVENOUS | Status: DC
  Administered 2017-11-11 (×2): via INTRAVENOUS

## 2017-11-11 MED ORDER — TRANEXAMIC ACID 1000 MG/10ML IV SOLN
1000.0000 mg | INTRAVENOUS | Status: AC
  Administered 2017-11-11: 1000 mg via INTRAVENOUS
  Filled 2017-11-11: qty 1100

## 2017-11-11 MED ORDER — CLINDAMYCIN PHOSPHATE 900 MG/50ML IV SOLN
INTRAVENOUS | Status: AC
  Filled 2017-11-11: qty 50

## 2017-11-11 MED ORDER — PROPOFOL 10 MG/ML IV BOLUS
INTRAVENOUS | Status: AC
  Filled 2017-11-11: qty 40

## 2017-11-11 MED ORDER — MIDAZOLAM HCL 5 MG/5ML IJ SOLN
INTRAMUSCULAR | Status: DC | PRN
  Administered 2017-11-11 (×2): 0.5 mg via INTRAVENOUS
  Administered 2017-11-11: 1 mg via INTRAVENOUS

## 2017-11-11 MED ORDER — PHENYLEPHRINE HCL 10 MG/ML IJ SOLN
INTRAMUSCULAR | Status: AC
  Filled 2017-11-11: qty 2

## 2017-11-11 MED ORDER — OXYCODONE HCL 5 MG PO TABS
5.0000 mg | ORAL_TABLET | ORAL | Status: DC | PRN
  Administered 2017-11-12: 14:00:00 5 mg via ORAL
  Administered 2017-11-12 – 2017-11-13 (×2): 10 mg via ORAL
  Filled 2017-11-11 (×3): qty 2

## 2017-11-11 MED ORDER — ACETAMINOPHEN 650 MG RE SUPP: 650.0000 mg | RECTAL | Status: DC | PRN

## 2017-11-11 MED ORDER — MENTHOL 3 MG MT LOZG: 1.0000 | LOZENGE | OROMUCOSAL | Status: DC | PRN

## 2017-11-11 MED ORDER — DEXTROSE 5 % IV SOLN
500.0000 mg | Freq: Four times a day (QID) | INTRAVENOUS | Status: DC | PRN
  Filled 2017-11-11: qty 5

## 2017-11-11 MED ORDER — FENTANYL CITRATE (PF) 100 MCG/2ML IJ SOLN
INTRAMUSCULAR | Status: AC
  Filled 2017-11-11: qty 2

## 2017-11-11 MED ORDER — MIDAZOLAM HCL 2 MG/2ML IJ SOLN
INTRAMUSCULAR | Status: AC
  Filled 2017-11-11: qty 2

## 2017-11-11 MED ORDER — CHLORHEXIDINE GLUCONATE 4 % EX LIQD: 60.0000 mL | Freq: Once | CUTANEOUS | Status: DC

## 2017-11-11 MED ORDER — METOCLOPRAMIDE HCL 5 MG PO TABS
5.0000 mg | ORAL_TABLET | Freq: Three times a day (TID) | ORAL | Status: DC | PRN
  Administered 2017-11-12: 10 mg via ORAL
  Filled 2017-11-11: qty 2

## 2017-11-11 MED ORDER — SODIUM CHLORIDE 0.9 % IR SOLN
Status: DC | PRN
  Administered 2017-11-11: 1000 mL

## 2017-11-11 MED ORDER — POLYETHYLENE GLYCOL 3350 17 G PO PACK: 17.0000 g | PACK | Freq: Every day | ORAL | Status: DC | PRN

## 2017-11-11 MED ORDER — POTASSIUM CHLORIDE CRYS ER 20 MEQ PO TBCR
20.0000 meq | EXTENDED_RELEASE_TABLET | Freq: Every day | ORAL | Status: DC
  Administered 2017-11-11 – 2017-11-12 (×2): 20 meq via ORAL
  Filled 2017-11-11 (×4): qty 1

## 2017-11-11 MED ORDER — DOCUSATE SODIUM 100 MG PO CAPS
100.0000 mg | ORAL_CAPSULE | Freq: Two times a day (BID) | ORAL | Status: DC
  Administered 2017-11-12 – 2017-11-14 (×5): 100 mg via ORAL
  Filled 2017-11-11 (×5): qty 1

## 2017-11-11 MED ORDER — FENTANYL CITRATE (PF) 100 MCG/2ML IJ SOLN: 25.0000 ug | INTRAMUSCULAR | Status: DC | PRN

## 2017-11-11 MED ORDER — EPHEDRINE SULFATE-NACL 50-0.9 MG/10ML-% IV SOSY
PREFILLED_SYRINGE | INTRAVENOUS | Status: DC | PRN
  Administered 2017-11-11 (×5): 5 mg via INTRAVENOUS

## 2017-11-11 MED ORDER — HYDROMORPHONE HCL 1 MG/ML IJ SOLN: 0.5000 mg | INTRAMUSCULAR | Status: DC | PRN

## 2017-11-11 MED ORDER — ASPIRIN 81 MG PO CHEW
81.0000 mg | CHEWABLE_TABLET | Freq: Two times a day (BID) | ORAL | Status: DC
  Administered 2017-11-11 – 2017-11-14 (×6): 81 mg via ORAL
  Filled 2017-11-11 (×6): qty 1

## 2017-11-11 MED ORDER — SODIUM CHLORIDE 0.9 % IV SOLN
INTRAVENOUS | Status: DC
  Administered 2017-11-11 – 2017-11-12 (×2): via INTRAVENOUS

## 2017-11-11 MED ORDER — PHENYLEPHRINE HCL 10 MG/ML IJ SOLN
INTRAVENOUS | Status: DC | PRN
  Administered 2017-11-11: 20 ug/min via INTRAVENOUS

## 2017-11-11 MED ORDER — PROPOFOL 500 MG/50ML IV EMUL
INTRAVENOUS | Status: DC | PRN
  Administered 2017-11-11: 50 ug/kg/min via INTRAVENOUS

## 2017-11-11 MED ORDER — ROSUVASTATIN CALCIUM 10 MG PO TABS
10.0000 mg | ORAL_TABLET | Freq: Every day | ORAL | Status: DC
  Administered 2017-11-11 – 2017-11-14 (×4): 10 mg via ORAL
  Filled 2017-11-11 (×4): qty 1

## 2017-11-11 MED ORDER — DEXAMETHASONE SODIUM PHOSPHATE 10 MG/ML IJ SOLN
INTRAMUSCULAR | Status: DC | PRN
  Administered 2017-11-11: 10 mg via INTRAVENOUS

## 2017-11-11 MED ORDER — HYDROCHLOROTHIAZIDE 25 MG PO TABS
25.0000 mg | ORAL_TABLET | Freq: Every day | ORAL | Status: DC
  Administered 2017-11-11 – 2017-11-14 (×2): 25 mg via ORAL
  Filled 2017-11-11 (×4): qty 1

## 2017-11-11 MED ORDER — ONDANSETRON HCL 4 MG/2ML IJ SOLN: 4.0000 mg | Freq: Four times a day (QID) | INTRAMUSCULAR | Status: DC | PRN

## 2017-11-11 MED ORDER — FENTANYL CITRATE (PF) 100 MCG/2ML IJ SOLN
INTRAMUSCULAR | Status: DC | PRN
  Administered 2017-11-11: 50 ug via INTRAVENOUS

## 2017-11-11 MED ORDER — ONDANSETRON HCL 4 MG/2ML IJ SOLN: 4.0000 mg | Freq: Once | INTRAMUSCULAR | Status: DC | PRN

## 2017-11-11 MED ORDER — CLINDAMYCIN PHOSPHATE 900 MG/50ML IV SOLN
900.0000 mg | INTRAVENOUS | Status: AC
  Administered 2017-11-11: 900 mg via INTRAVENOUS

## 2017-11-11 MED ORDER — STERILE WATER FOR IRRIGATION IR SOLN
Status: DC | PRN
  Administered 2017-11-11: 2000 mL

## 2017-11-11 MED ORDER — ACETAMINOPHEN 325 MG PO TABS: 650.0000 mg | ORAL_TABLET | ORAL | Status: DC | PRN

## 2017-11-11 MED ORDER — PHENOL 1.4 % MT LIQD
1.0000 | OROMUCOSAL | Status: DC | PRN
  Filled 2017-11-11: qty 177

## 2017-11-11 MED ORDER — CARVEDILOL 25 MG PO TABS
50.0000 mg | ORAL_TABLET | Freq: Two times a day (BID) | ORAL | Status: DC
  Administered 2017-11-11 – 2017-11-14 (×6): 50 mg via ORAL
  Filled 2017-11-11 (×6): qty 2

## 2017-11-11 SURGICAL SUPPLY — 46 items
BAG ZIPLOCK 12X15 (MISCELLANEOUS) IMPLANT
BENZOIN TINCTURE PRP APPL 2/3 (GAUZE/BANDAGES/DRESSINGS) ×3 IMPLANT
BLADE SAW SGTL 18X1.27X75 (BLADE) ×2 IMPLANT
BLADE SAW SGTL 18X1.27X75MM (BLADE) ×1
CAPT HIP TOTAL 2 ×3 IMPLANT
CELLS DAT CNTRL 66122 CELL SVR (MISCELLANEOUS) ×1 IMPLANT
CLOSURE WOUND 1/2 X4 (GAUZE/BANDAGES/DRESSINGS) ×1
COVER PERINEAL POST (MISCELLANEOUS) ×3 IMPLANT
COVER SURGICAL LIGHT HANDLE (MISCELLANEOUS) ×3 IMPLANT
DRAPE STERI IOBAN 125X83 (DRAPES) ×3 IMPLANT
DRAPE U-SHAPE 47X51 STRL (DRAPES) ×6 IMPLANT
DRSG AQUACEL AG ADV 3.5X10 (GAUZE/BANDAGES/DRESSINGS) ×3 IMPLANT
DURAPREP 26ML APPLICATOR (WOUND CARE) ×3 IMPLANT
ELECT REM PT RETURN 15FT ADLT (MISCELLANEOUS) ×3 IMPLANT
GAUZE XEROFORM 1X8 LF (GAUZE/BANDAGES/DRESSINGS) IMPLANT
GLOVE BIO SURGEON STRL SZ 6.5 (GLOVE) ×2 IMPLANT
GLOVE BIO SURGEON STRL SZ7.5 (GLOVE) ×3 IMPLANT
GLOVE BIO SURGEONS STRL SZ 6.5 (GLOVE) ×1
GLOVE BIOGEL PI IND STRL 6.5 (GLOVE) ×1 IMPLANT
GLOVE BIOGEL PI IND STRL 7.0 (GLOVE) ×2 IMPLANT
GLOVE BIOGEL PI IND STRL 7.5 (GLOVE) ×3 IMPLANT
GLOVE BIOGEL PI IND STRL 8 (GLOVE) ×2 IMPLANT
GLOVE BIOGEL PI INDICATOR 6.5 (GLOVE) ×2
GLOVE BIOGEL PI INDICATOR 7.0 (GLOVE) ×4
GLOVE BIOGEL PI INDICATOR 7.5 (GLOVE) ×6
GLOVE BIOGEL PI INDICATOR 8 (GLOVE) ×4
GLOVE ECLIPSE 8.0 STRL XLNG CF (GLOVE) ×3 IMPLANT
GLOVE SURG SS PI 7.5 STRL IVOR (GLOVE) ×3 IMPLANT
GOWN STRL REUS W/ TWL XL LVL3 (GOWN DISPOSABLE) ×1 IMPLANT
GOWN STRL REUS W/TWL LRG LVL3 (GOWN DISPOSABLE) ×3 IMPLANT
GOWN STRL REUS W/TWL XL LVL3 (GOWN DISPOSABLE) ×11 IMPLANT
HANDPIECE INTERPULSE COAX TIP (DISPOSABLE) ×2
HOLDER FOLEY CATH W/STRAP (MISCELLANEOUS) ×3 IMPLANT
PACK ANTERIOR HIP CUSTOM (KITS) ×3 IMPLANT
RTRCTR WOUND ALEXIS 18CM MED (MISCELLANEOUS) ×3
SET HNDPC FAN SPRY TIP SCT (DISPOSABLE) ×1 IMPLANT
STAPLER VISISTAT 35W (STAPLE) IMPLANT
STRIP CLOSURE SKIN 1/2X4 (GAUZE/BANDAGES/DRESSINGS) ×2 IMPLANT
SUT ETHIBOND NAB CT1 #1 30IN (SUTURE) ×3 IMPLANT
SUT MNCRL AB 4-0 PS2 18 (SUTURE) IMPLANT
SUT VIC AB 0 CT1 36 (SUTURE) ×3 IMPLANT
SUT VIC AB 1 CT1 36 (SUTURE) ×3 IMPLANT
SUT VIC AB 2-0 CT1 27 (SUTURE) ×4
SUT VIC AB 2-0 CT1 TAPERPNT 27 (SUTURE) ×2 IMPLANT
TRAY FOLEY CATH SILVER 14FR (SET/KITS/TRAYS/PACK) ×3 IMPLANT
YANKAUER SUCT BULB TIP 10FT TU (MISCELLANEOUS) ×3 IMPLANT

## 2017-11-11 NOTE — Anesthesia Preprocedure Evaluation (Signed)
Anesthesia Evaluation  Patient identified by MRN, date of birth, ID band Patient awake    Reviewed: Allergy & Precautions, NPO status , Patient's Chart, lab work & pertinent test results, reviewed documented beta blocker date and time   History of Anesthesia Complications (+) PONV and history of anesthetic complications  Airway Mallampati: I  TM Distance: >3 FB Neck ROM: Full    Dental  (+) Lower Dentures, Upper Dentures   Pulmonary former smoker,    Pulmonary exam normal        Cardiovascular hypertension, Pt. on medications and Pt. 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 Never been shocked. Place magnet over device.   Neuro/Psych negative neurological ROS  negative psych ROS   GI/Hepatic negative GI ROS, Neg liver ROS,   Endo/Other  negative endocrine ROS  Renal/GU negative Renal ROS     Musculoskeletal  (+) Arthritis , Osteoarthritis,    Abdominal   Peds  Hematology  (+) Blood dyscrasia, anemia , Plt 190k on 11/03/17   Anesthesia Other Findings   Reproductive/Obstetrics                             Anesthesia Physical  Anesthesia Plan  ASA: III  Anesthesia Plan: Spinal and MAC   Post-op Pain Management:    Induction: Intravenous  PONV Risk Score and Plan: 3 and Ondansetron and Propofol infusion  Airway Management Planned: Simple Face Mask  Additional Equipment:   Intra-op Plan:   Post-operative Plan:   Informed Consent: I have reviewed the patients History and Physical, chart, labs and discussed the procedure including the risks, benefits and alternatives for the proposed anesthesia with the patient or authorized representative who has indicated his/her understanding and acceptance.   Dental advisory given  Plan Discussed with: CRNA and Surgeon  Anesthesia Plan Comments:  (Discussed risks and benefits of and differences between spinal and general. Discussed risks of spinal including headache, backache, failure, bleeding, infection, and nerve damage. Patient consents to spinal. Questions answered. Coagulation studies and platelet count acceptable.)        Anesthesia Quick Evaluation

## 2017-11-11 NOTE — Brief Op Note (Signed)
11/11/2017  11:57 AM  PATIENT:  April [name]  76 y.o. female  PRE-OPERATIVE DIAGNOSIS:  Osteoarthritis right hip  POST-OPERATIVE DIAGNOSIS:  Osteoarthritis right hip  PROCEDURE:  Procedure(s): RIGHT TOTAL HIP ARTHROPLASTY ANTERIOR APPROACH (Right)  SURGEON:  Surgeon(s) and Role:    Kathryne Hitch, MD - Primary  ANESTHESIA:   spinal  EBL:  200 mL   COUNTS:  YES  DICTATION: .Other Dictation: Dictation Number [id number]  PLAN OF CARE: Admit to inpatient   PATIENT DISPOSITION:  PACU - hemodynamically stable.   Delay start of Pharmacological VTE agent (>24hrs) due to surgical blood loss or risk of bleeding: no

## 2017-11-11 NOTE — Transfer of Care (Signed)
Immediate Anesthesia Transfer of Care Note  Patient: April [name]  Procedure(s) Performed: RIGHT TOTAL HIP ARTHROPLASTY ANTERIOR APPROACH (Right Hip)  Patient Location: PACU  Anesthesia Type:Spinal  Level of Consciousness: awake, alert  and oriented  Airway & Oxygen Therapy: Patient Spontanous Breathing and Patient connected to face mask  Post-op Assessment: Report given to RN and Post -op Vital signs reviewed and stable  Post vital signs: Reviewed and stable  Last Vitals:  Vitals:   11/11/17 0658  BP: (!) 141/60  Pulse: 70  Resp: 18  Temp: 36.9 C  SpO2: 95%    Last Pain:  Vitals:   11/11/17 0658  TempSrc: Oral      Patients Stated Pain Goal: 4 (11/11/17 0730)  Complications: No apparent anesthesia complications

## 2017-11-11 NOTE — H&P (Signed)
TOTAL HIP ADMISSION H&P  Patient is admitted for right total hip arthroplasty.  Subjective:  Chief Complaint: right hip pain  HPI: April [name], 76 y.o. female, has a history of pain and functional disability in the right hip(s) due to arthritis and patient has failed non-surgical conservative treatments for greater than 12 weeks to include NSAID's and/or analgesics, corticosteriod injections, flexibility and strengthening excercises, use of assistive devices and activity modification.  Onset of symptoms was gradual starting 2 years ago with gradually worsening course since that time.The patient noted no past surgery on the right hip(s).  Patient currently rates pain in the right hip at 10 out of 10 with activity. Patient has night pain, worsening of pain with activity and weight bearing, trendelenberg gait, pain that interfers with activities of daily living and pain with passive range of motion. Patient has evidence of subchondral cysts, subchondral sclerosis, periarticular osteophytes and joint space narrowing by imaging studies. This condition presents safety issues increasing the risk of falls.  There is no current active infection.  Patient Active Problem List   Diagnosis Date Noted  . Unilateral primary osteoarthritis, right hip 09/14/2017  . Hyperlipidemia 07/29/2017  . Unilateral primary osteoarthritis, left hip 01/28/2017  . Status post left hip replacement 01/28/2017  . 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 VENTRICULAR FIBRILLATION 02/03/2010  . Essential hypertension 01/09/2010   Past Medical History:  Diagnosis Date  . AICD (automatic cardioverter/defibrillator) present   . Arthritis   . HTN (hypertension)   . Myocardial infarction (HCC)    mild Mi 2000  . PONV (postoperative nausea and vomiting)   . Ventricular fibrillation [facility])     Past Surgical History:  Procedure Laterality Date  . IMPLANTABLE CARDIOVERTER DEFIBRILLATOR (ICD)  GENERATOR CHANGE N/A 10/14/2014   Procedure: ICD GENERATOR CHANGE;  Surgeon: Marinus Maw, MD;  Location: [facility] CATH LAB;  Service: Cardiovascular;  Laterality: N/A;  . IMPLANTABLE CARDIOVERTER DEFIBRILLATOR IMPLANT  2000; 10-14-2014   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; generator change 09-2014 by Dr Ladona Ridgel  . TOTAL HIP ARTHROPLASTY Left 01/28/2017   Procedure: LEFT TOTAL HIP ARTHROPLASTY ANTERIOR APPROACH;  Surgeon: Kathryne Hitch, MD;  Location: [facility] ORS;  Service: Orthopedics;  Laterality: Left;    Current Facility-Administered Medications  Medication Dose Route Frequency Provider Last Rate Last Dose  . clindamycin (CLEOCIN) 900 MG/50ML IVPB           . chlorhexidine (HIBICLENS) 4 % liquid 4 application  60 mL Topical Once Richardean Canal W, PA-C      . clindamycin (CLEOCIN) IVPB 900 mg  900 mg Intravenous On Call to OR Kirtland Bouchard, PA-C      . lactated ringers infusion   Intravenous Continuous Ellender, Catheryn Bacon, MD      . tranexamic acid (CYKLOKAPRON) 1,000 mg in sodium chloride 0.9 % 100 mL IVPB  1,000 mg Intravenous To OR Kirtland Bouchard, PA-C       Allergies  Allergen Reactions  . Codeine Nausea And Vomiting  . Penicillins Hives, Rash and Other (See Comments)    As a child Has patient had a PCN reaction causing immediate rash, facial/tongue/throat swelling, SOB or lightheadedness with hypotension:unsure Has patient had a PCN reaction causing severe rash involving mucus membranes or skin necrosis:unsure Has patient had a PCN reaction that required hospitalization:No Has patient had a PCN reaction occurring within the last 10 years:No If all of the above answers are "NO", then may proceed with Cephalosporin  use.     Social History   Tobacco Use  . Smoking status: Former Smoker    Last attempt to quit: 10/27/1999    Years since quitting: 18.0  . Smokeless tobacco: Never Used  Substance Use Topics  . Alcohol use: No    Family History  Problem Relation  Age of Onset  . Heart attack Mother   . Heart attack Father        x2  . Diabetes Maternal Aunt   . Diabetes Maternal Grandmother   . Heart Problems Maternal Grandmother      Review of Systems  Musculoskeletal: Positive for joint pain.  All other systems reviewed and are negative.   Objective:  Physical Exam  Constitutional: She is oriented to person, place, and time. She appears well-developed and well-nourished.  HENT:  Head: Normocephalic and atraumatic.  Eyes: EOM are normal. Pupils are equal, round, and reactive to light.  Neck: Normal range of motion. Neck supple.  Cardiovascular: Normal rate and regular rhythm.  Respiratory: Effort normal and breath sounds normal.  GI: Soft. Bowel sounds are normal.  Musculoskeletal:       Right hip: She exhibits decreased range of motion, decreased strength, tenderness and bony tenderness.  Neurological: She is alert and oriented to person, place, and time.  Skin: Skin is warm and dry.  Psychiatric: She has a normal mood and affect.    Vital signs in last 24 hours: Temp:  [98.4 F (36.9 C)] 98.4 F (36.9 C) (11/16 0658) Pulse Rate:  [70] 70 (11/16 0658) Resp:  [18] 18 (11/16 0658) BP: (141)/(60) 141/60 (11/16 0658) SpO2:  [95 %] 95 % (11/16 0658)  Labs:   Estimated body mass index is 26.26 kg/m as calculated from the following:   Height as of 11/03/17: 5\' 4"  (1.626 m).   Weight as of 11/03/17: 153 lb (69.4 kg).   Imaging Review Plain radiographs demonstrate severe degenerative joint disease of the right hip(s). The bone quality appears to be good for age and reported activity level.  Assessment/Plan:  End stage arthritis, right hip(s)  The patient history, physical examination, clinical judgement of the provider and imaging studies are consistent with end stage degenerative joint disease of the right hip(s) and total hip arthroplasty is deemed medically necessary. The treatment options including medical management,  injection therapy, arthroscopy and arthroplasty were discussed at length. The risks and benefits of total hip arthroplasty were presented and reviewed. The risks due to aseptic loosening, infection, stiffness, dislocation/subluxation,  thromboembolic complications and other imponderables were discussed.  The patient acknowledged the explanation, agreed to proceed with the plan and consent was signed. Patient is being admitted for inpatient treatment for surgery, pain control, PT, OT, prophylactic antibiotics, VTE prophylaxis, progressive ambulation and ADL's and discharge planning.The patient is planning to be discharged home with home health services

## 2017-11-11 NOTE — Op Note (Signed)
NAME:  April [name], April [name]                     ACCOUNT NO.:  MEDICAL RECORD NO.:  [id number]  LOCATION:                                 FACILITY:  PHYSICIAN:  Vanita PandaChristopher Y. Magnus IvanBlackman, M.D.DATE OF BIRTH:  DATE OF PROCEDURE:  11/11/2017 DATE OF DISCHARGE:                              OPERATIVE REPORT   PREOPERATIVE DIAGNOSES:  Primary osteoarthritis and degenerative joint disease, right hip.  POSTOPERATIVE DIAGNOSES:  Primary osteoarthritis and degenerative joint disease, right hip.  PROCEDURE:  Right total hip arthroplasty through direct anterior approach.  IMPLANTS:  DePuy Sector Gription acetabular component size 50, size 32 +4, neutral polyethylene liner, size 12 Corail femoral component with standard offset, size 32 +1 ceramic hip ball.  SURGEON:  Vanita PandaChristopher Y. Magnus IvanBlackman, M.D.  ASSISTANT:  Richardean CanalGilbert Clark, PA-C.  ANESTHESIA:  Spinal.  ANTIBIOTICS:  2 g of IV Ancef.  BLOOD LOSS:  200 mL.  COMPLICATIONS:  None.  INDICATIONS:  Ms. April DikeCoppage is a very pleasant 76 year old well known to me.  She has severe bilateral hip osteoarthritis and in February of this year underwent a successful left total hip arthroplasty.  Her right hip shows severe arthritis and now she wishes to have the right hip surgery performed.  She understands fully the risks of acute blood loss anemia, nerve and vessel injury, fracture, infection, dislocation, DVT.  She understands our goals are to decrease pain, improve mobility, and overall improved quality of life.  Her x-ray showed complete loss of the joint space and her pain is daily and it is 10/10.  It is detrimental effect due to her activities of daily living, her quality of life, and her mobility.  PROCEDURE DESCRIPTION:  After informed consent was obtained, appropriate right hip was marked.  She was brought to the operating room and spinal anesthesia was obtained while she was on her stretcher.  She was laid in a supine position on a stretcher  and a Foley catheter was placed.  Both feet had traction boots applied to them.  She did start off significantly shorter on the right leg then the left, but our intraoperative x-ray showed that she was dead on, so we knew we needed to make her longer radiographically and stability wise.  After informed consent was obtained, appropriate right hip was marked. Again, was placed supine on the operating table.  After spinal anesthesia was obtained, traction boots were applied.  A perineal post was then placed and both feet were in inline skeletal traction devices, no traction applied.  Her right operative hip was prepped and draped with DuraPrep and sterile drapes.  Time-out was called to identify correct patient and correct right hip.  We then made a standard approach to the hip, making our incision just inferior and posterior to the anterior superior iliac spine and carried this obliquely down the leg. We dissected down the tensor fascia lata muscle.  The tensor fascia was then divided longitudinally to proceed with direct anterior approach to the hip.  We identified and cauterized circumflex vessels and then identified the hip capsule.  I opened up the hip capsule in an L-type format, finding a large joint effusion and significant  arthritis throughout the hip.  We placed Cobra retractors around the medial and lateral femoral neck.  We then made our femoral neck cut proximal to the lesser trochanter with an oscillating saw and completed this on osteotome.  We placed a corkscrew guide in the femoral head and removed the femoral head in its entirety and found it to be devoid of cartilage. We then cleaned the acetabular remnants of acetabular labrum and other debris.  I placed a bent Hohmann over the medial acetabular rim.  I then began reaming under direct visualization from a size 43 reamer in stepwise increments up to a size 50 with all reamers under direct visualization and the last reamer  under direct fluoroscopy, so we could obtain our depth of reaming, our inclination and anteversion.  Once we were pleased with this, we placed the real DePuy Sector Gription acetabular component size 50 and a 32 +4 polyethylene liner for that size acetabular component.  Attention was then turned to the femur. With the leg externally rotated to 120 degrees, extended and adducted, we were able to place a Mueller retractor medially and a Hohmann retractor behind the greater trochanter.  We released the lateral joint capsule and used a box cutting osteotome to enter the femoral canal and a rongeur to lateralize.  We then began broaching from a size 8 broach using the Corail broaching system going up to a size 12.  With the size 12 in place, we used a calcar planer and then trialed a standard offset femoral neck and a 32 +1 hip ball.  We brought the leg back over and up with traction and internal rotation reducing the pelvis and we were pleased with stability and range of motion.  When we measured it radiographically, she looks long, but we noted that she is supposed to look long since her preoperative radiographs lying down.  She is short and we no need equal leg lengths.  I felt good about the leg lengths being equal and we dislocated the hip and removed the trial components. We were able to place the real Corail femoral component size 12 with standard offset and the real 32 +1 ceramic hip ball.  Again, we reduced this into the acetabulum and we were pleased with stability, range of motion offset, and leg length.  We then irrigated the soft tissue with normal saline solution using pulsatile lavage.  There was no joint capsule to close.  We closed the tensor fascia with interrupted #1 Vicryl suture followed by 0 Vicryl in the deep tissue, 2-0 Vicryl in the subcutaneous tissue, 4-0 Monocryl subcuticular stitch and Steri-Strips on the skin.  An Aquacel dressing was applied.  She was taken off  the Hana table and she was taken to the recovery room in stable condition. All final counts were correct.  There were no complications noted.     Vanita Panda. Magnus Ivan, M.D.     CYB/MEDQ  D:  11/11/2017  T:  11/11/2017  Job:  [id number]

## 2017-11-11 NOTE — Progress Notes (Signed)
Portable AP Pelvis X-ray done. 

## 2017-11-11 NOTE — Progress Notes (Signed)
X-ray results noted 

## 2017-11-11 NOTE — Evaluation (Signed)
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  Pt should progress to dc home with family assist and HHPT follow up.    Follow Up Recommendations Home health PT    Equipment Recommendations  None recommended by PT    Recommendations for Other Services OT consult     Precautions / Restrictions Precautions Precautions: Fall Restrictions Weight Bearing Restrictions: No Other Position/Activity Restrictions: WBAT      Mobility  Bed Mobility Overal bed mobility: Needs Assistance Bed Mobility: Supine to Sit     Supine to sit: Min assist     General bed mobility comments: cues for sequence and use of L LE to self assist  Transfers Overall transfer level: Needs assistance Equipment used: Rolling walker (2 wheeled) Transfers: Sit to/from Stand Sit to Stand: Min assist         General transfer comment: cues for sequence and use of L LE to self assist  Ambulation/Gait Ambulation/Gait assistance: Min assist Ambulation Distance (Feet): 100 Feet Assistive device: Rolling walker (2 wheeled) Gait Pattern/deviations: Step-to pattern;Step-through pattern;Decreased step length - right;Decreased step length - left;Shuffle;Trunk flexed Gait velocity: decr Gait velocity interpretation: Below normal speed for age/gender General Gait Details: cues for posture, position from RW and initial sequence  Stairs            Wheelchair Mobility    Modified Rankin (Stroke Patients Only)       Balance                                             Pertinent Vitals/Pain Pain Assessment: 0-10 Pain Score: 4  Pain Location: R hip Pain Descriptors / Indicators:  Sore Pain Intervention(s): Limited activity within patient's tolerance;Monitored during session;Premedicated before session;Ice applied    Home Living Family/patient expects to be discharged to:: Private residence Living Arrangements: Alone Available Help at Discharge: Family Type of Home: House Home Access: Stairs to enter Entrance Stairs-Rails: None Secretary/administratorntrance Stairs-Number of Steps: 3 Home Layout: Two level   Additional Comments: staying with daughter    Prior Function Level of Independence: Independent with assistive device(s)         Comments: ambulatory with SPC vs RW     Hand Dominance        Extremity/Trunk Assessment   Upper Extremity Assessment Upper Extremity Assessment: Overall WFL for tasks assessed    Lower Extremity Assessment Lower Extremity Assessment: RLE deficits/detail       Communication   Communication: No difficulties  Cognition Arousal/Alertness: Awake/alert Behavior During Therapy: WFL for tasks assessed/performed Overall Cognitive Status: Within Functional Limits for tasks assessed                                        General Comments      Exercises Total Joint Exercises Ankle Circles/Pumps: AROM;Both;15 reps;Supine   Assessment/Plan    PT Assessment Patient needs continued PT services  PT Problem List Decreased strength;Decreased range of motion;Decreased activity tolerance;Decreased mobility;Decreased knowledge of use of DME;Pain  PT Treatment Interventions DME instruction;Gait training;Stair training;Functional mobility training;Therapeutic activities;Therapeutic exercise;Patient/family education    PT Goals (Current goals can be found in the Care Plan section)  Acute Rehab PT Goals Patient Stated Goal: Regain IND and walk with less pain PT Goal Formulation: With patient Time For Goal Achievement: 11/16/17 Potential to Achieve Goals: Good    Frequency 7X/week   Barriers to discharge         Co-evaluation               AM-PAC PT "6 Clicks" Daily Activity  Outcome Measure Difficulty turning over in bed (including adjusting bedclothes, sheets and blankets)?: Unable Difficulty moving from lying on back to sitting on the side of the bed? : Unable Difficulty sitting down on and standing up from a chair with arms (e.g., wheelchair, bedside commode, etc,.)?: Unable Help needed moving to and from a bed to chair (including a wheelchair)?: A Little Help needed walking in hospital room?: A Little Help needed climbing 3-5 steps with a railing? : A Little 6 Click Score: 12    End of Session Equipment Utilized During Treatment: Gait belt Activity Tolerance: Patient tolerated treatment well Patient left: in chair;with call bell/phone within reach;with family/visitor present Nurse Communication: Mobility status PT Visit Diagnosis: Difficulty in walking, not elsewhere classified (R26.2)    Time: 7654-6503 PT Time Calculation (min) (ACUTE ONLY): 19 min   Charges:   PT Evaluation $PT Eval Low Complexity: 1 Low     PT G Codes:        Pg [phone]   Adalea Handler 11/11/2017, 4:25 PM

## 2017-11-12 ENCOUNTER — Encounter (HOSPITAL_COMMUNITY): Payer: Self-pay | Admitting: Orthopaedic Surgery

## 2017-11-12 LAB — BASIC METABOLIC PANEL
ANION GAP: 7 (ref 5–15)
BUN: 13 mg/dL (ref 6–20)
CHLORIDE: 104 mmol/L (ref 101–111)
CO2: 26 mmol/L (ref 22–32)
Calcium: 8.7 mg/dL — ABNORMAL LOW (ref 8.9–10.3)
Creatinine, Ser: 0.6 mg/dL (ref 0.44–1.00)
GFR calc Af Amer: >60 mL/min (ref >60)
GLUCOSE: 122 mg/dL — AB (ref 65–99)
POTASSIUM: 4.6 mmol/L (ref 3.5–5.1)
Sodium: 137 mmol/L (ref 135–145)

## 2017-11-12 LAB — CBC
HCT: 28.6 % — ABNORMAL LOW (ref 36.0–46.0)
HEMOGLOBIN: 9.3 g/dL — AB (ref 12.0–15.0)
MCH: 29.4 pg (ref 26.0–34.0)
MCHC: 32.5 g/dL (ref 30.0–36.0)
MCV: 90.5 fL (ref 78.0–100.0)
PLATELETS: 186 10*3/uL (ref 150–400)
RBC: 3.16 MIL/uL — ABNORMAL LOW (ref 3.87–5.11)
RDW: 14.3 % (ref 11.5–15.5)
WBC: 12.1 10*3/uL — AB (ref 4.0–10.5)

## 2017-11-12 MED ORDER — OXYCODONE-ACETAMINOPHEN 5-325 MG PO TABS: 1.0000 | ORAL_TABLET | ORAL | 0 refills | Status: DC | PRN

## 2017-11-12 MED ORDER — METHOCARBAMOL 500 MG PO TABS: 500.0000 mg | ORAL_TABLET | Freq: Four times a day (QID) | ORAL | 0 refills | Status: DC | PRN

## 2017-11-12 NOTE — Evaluation (Signed)
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), MI and defibrillator placement   Clinical Impression   OT education complete- No DME needs    Follow Up Recommendations  No OT follow up    Equipment Recommendations  None recommended by OT    Recommendations for Other Services       Precautions / Restrictions Precautions Precautions: Fall Restrictions Weight Bearing Restrictions: No Other Position/Activity Restrictions: WBAT      Mobility Bed Mobility Overal bed mobility: Needs Assistance Bed Mobility: Supine to Sit     Supine to sit: Supervision;HOB elevated        Transfers Overall transfer level: Needs assistance Equipment used: Rolling walker (2 wheeled) Transfers: Sit to/from [employer] Sit to Stand: Min guard Stand pivot transfers: Min guard       General transfer comment: cues for sequence and use of L LE to self assist        ADL either performed or assessed with clinical judgement   ADL Overall ADL's : Needs assistance/impaired Eating/Feeding: Set up;Sitting   Grooming: Sitting;Set up   Upper Body Bathing: Set up;Sitting   Lower Body Bathing: Minimal assistance;Sit to/from stand   Upper Body Dressing : Set up;Sitting   Lower Body Dressing: Minimal assistance;Sit to/from stand   Toilet Transfer: Min guard;RW;Ambulation;Comfort height toilet   Toileting- Architect and Hygiene: Supervision/safety;Sit to/from stand   Tub/ Engineer, structural: Education officer, environmental Details (indicate cue type and reason): verbalized safety Functional mobility during ADLs: Min guard General ADL Comments: family will A     Vision Patient Visual Report: No change from baseline              Pertinent Vitals/Pain Pain Score: 3  Pain Location: R hip Pain  Intervention(s): Repositioned     Hand Dominance     Extremity/Trunk Assessment Upper Extremity Assessment Upper Extremity Assessment: Generalized weakness           Communication Communication Communication: No difficulties   Cognition Arousal/Alertness: Awake/alert Behavior During Therapy: WFL for tasks assessed/performed Overall Cognitive Status: Within Functional Limits for tasks assessed                                                Home Living Family/patient expects to be discharged to:: Private residence Living Arrangements: Alone Available Help at Discharge: Family Type of Home: House Home Access: Stairs to enter Secretary/administrator of Steps: 3 Entrance Stairs-Rails: None Home Layout: Two level Alternate Level Stairs-Number of Steps: flight Alternate Level Stairs-Rails: Left Bathroom Shower/Tub: Walk-in shower             Additional Comments: staying with daughter      Prior Functioning/Environment Level of Independence: Independent with assistive device(s)        Comments: ambulatory with SPC vs RW        OT Problem List:        OT Treatment/Interventions:      OT Goals(Current goals can be found in the care plan section) Acute Rehab OT Goals Patient Stated Goal: Regain IND and walk with less pain OT Goal Formulation: With patient  OT Frequency:     Barriers to D/C:  AM-PAC PT "6 Clicks" Daily Activity     Outcome Measure Help from another person eating meals?: None Help from another person taking care of personal grooming?: None Help from another person toileting, which includes using toliet, bedpan, or urinal?: A Little Help from another person bathing (including washing, rinsing, drying)?: A Little Help from another person to put on and taking off regular upper body clothing?: None Help from another person to put on and taking off regular lower body clothing?: A Little 6 Click Score: 21    End of Session Equipment Utilized During Treatment: Rolling walker Nurse Communication: Mobility status  Activity Tolerance: Patient tolerated treatment well Patient left: in chair                   Time: [id number] OT Time Calculation (min): 25 min Charges:  OT General Charges $OT Visit: 1 Visit OT Evaluation $OT Eval Low Complexity: 1 Low OT Treatments $Self Care/Home Management : 8-22 mins G-Codes:     Lise AuerLori Darby Fleeman, OT [phone]  Einar CrowEDDING, Lisa Blakeman D 11/12/2017, 9:29 AM

## 2017-11-12 NOTE — Progress Notes (Signed)
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   Follow Up Recommendations  Home health PT     Equipment Recommendations  None recommended by PT    Recommendations for Other Services OT consult     Precautions / Restrictions Precautions Precautions: Fall Restrictions Weight Bearing Restrictions: No Other Position/Activity Restrictions: WBAT    Mobility  Bed Mobility Overal bed mobility: Needs Assistance Bed Mobility: Sit to Supine       Sit to supine: Min assist   General bed mobility comments: cues for sequence and min assist to manage R LE  Transfers Overall transfer level: Needs assistance Equipment used: Rolling walker (2 wheeled) Transfers: Sit to/from Stand Sit to Stand: Min guard         General transfer comment: cues for LE management and use of UEs to self assist  Ambulation/Gait Ambulation/Gait assistance: Min guard Ambulation Distance (Feet): 200 Feet Assistive device: Rolling walker (2 wheeled) Gait Pattern/deviations: Step-to pattern;Step-through pattern;Decreased step length - right;Decreased step length - left;Shuffle;Trunk flexed Gait velocity: decr Gait velocity interpretation: Below normal speed for age/gender General Gait Details: cues for posture, position from RW and initial sequence   Stairs            Wheelchair Mobility    Modified Rankin (Stroke Patients Only)       Balance                                            Cognition Arousal/Alertness: Awake/alert Behavior During Therapy: WFL for tasks assessed/performed Overall Cognitive Status: Within Functional Limits for tasks assessed                                        Exercises Total Joint Exercises Ankle Circles/Pumps: AROM;Both;15 reps;Supine Quad Sets: AROM;Both;10 reps;Supine Heel Slides: AAROM;Right;20 reps;Supine Hip ABduction/ADduction: AAROM;Right;15 reps;Supine    General Comments        Pertinent Vitals/Pain Pain Assessment: 0-10 Pain Score: 6  Pain Location: R hip Pain Descriptors / Indicators: Sore Pain Intervention(s): Limited activity within patient's tolerance;Monitored during session;Patient requesting pain meds-RN notified;Ice applied(pt declined pain meds prior to session)    Home Living                      Prior Function            PT Goals (current goals can now be found in the care plan section) Acute Rehab PT Goals Patient Stated Goal: Regain IND and walk with less pain PT Goal Formulation: With patient Time For Goal Achievement: 11/16/17 Potential to Achieve Goals: Good Progress towards PT goals: Progressing toward goals    Frequency    7X/week      PT Plan Current plan remains appropriate    Co-evaluation              AM-PAC PT "6 Clicks" Daily Activity  Outcome Measure  Difficulty turning over in bed (including adjusting bedclothes, sheets and blankets)?: Unable Difficulty moving from lying  on back to sitting on the side of the bed? : Unable Difficulty sitting down on and standing up from a chair with arms (e.g., wheelchair, bedside commode, etc,.)?: Unable Help needed moving to and from a bed to chair (including a wheelchair)?: A Little Help needed walking in hospital room?: A Little Help needed climbing 3-5 steps with a railing? : A Little 6 Click Score: 12    End of Session Equipment Utilized During Treatment: Gait belt Activity Tolerance: Patient tolerated treatment well Patient left: in bed;with call bell/phone within reach;with family/visitor present Nurse Communication: Mobility status;Other (comment)(pts c/p distal R thigh edema) PT Visit Diagnosis:  Difficulty in walking, not elsewhere classified (R26.2)     Time: [id number] PT Time Calculation (min) (ACUTE ONLY): 20 min  Charges:  $Gait Training: 8-22 mins $Therapeutic Exercise: 8-22 mins                    G Codes:       Pg [phone]    Rhilee Currin 11/12/2017, 3:53 PM

## 2017-11-12 NOTE — Discharge Instructions (Signed)

## 2017-11-12 NOTE — Progress Notes (Signed)
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   Follow Up Recommendations  Home health PT     Equipment Recommendations  None recommended by PT    Recommendations for Other Services OT consult     Precautions / Restrictions Precautions Precautions: Fall Restrictions Weight Bearing Restrictions: No Other Position/Activity Restrictions: WBAT    Mobility  Bed Mobility Overal bed mobility: Needs Assistance Bed Mobility: Supine to Sit     Supine to sit: Supervision;HOB elevated     General bed mobility comments: OOB with OT and requests back to chair  Transfers Overall transfer level: Needs assistance Equipment used: Rolling walker (2 wheeled) Transfers: Sit to/from Stand Sit to Stand: Min guard Stand pivot transfers: Min guard       General transfer comment: cues for LE management and use of UEs to self assist  Ambulation/Gait Ambulation/Gait assistance: Min assist Ambulation Distance (Feet): 190 Feet Assistive device: Rolling walker (2 wheeled) Gait Pattern/deviations: Step-to pattern;Step-through pattern;Decreased step length - right;Decreased step length - left;Shuffle;Trunk flexed Gait velocity: decr Gait velocity interpretation: Below normal speed for age/gender General Gait Details: cues for posture, position from RW and initial sequence   Stairs            Wheelchair Mobility    Modified Rankin (Stroke Patients Only)       Balance                                            Cognition Arousal/Alertness: Awake/alert Behavior During Therapy: WFL for tasks assessed/performed Overall Cognitive Status: Within Functional Limits for tasks assessed                                         Exercises Total Joint Exercises Ankle Circles/Pumps: AROM;Both;15 reps;Supine Quad Sets: AROM;Both;10 reps;Supine Heel Slides: AAROM;Right;20 reps;Supine Hip ABduction/ADduction: AAROM;Right;15 reps;Supine    General Comments        Pertinent Vitals/Pain Pain Assessment: 0-10 Pain Score: 4  Pain Location: R hip Pain Descriptors / Indicators: Sore Pain Intervention(s): Limited activity within patient's tolerance;Monitored during session;Premedicated before session;Ice applied    Home Living Family/patient expects to be discharged to:: Private residence Living Arrangements: Alone Available Help at Discharge: Family Type of Home: House Home Access: Stairs to enter Entrance Stairs-Rails: None Home Layout: Two level   Additional Comments: staying with daughter    Prior Function Level of Independence: Independent with assistive device(s)      Comments: ambulatory with SPC vs RW   PT Goals (current goals can now be found in the care plan section) Acute Rehab PT Goals Patient Stated Goal: Regain IND and walk with less pain PT Goal Formulation: With patient Time For Goal Achievement: 11/16/17 Potential to Achieve Goals: Good Progress towards PT goals: Progressing toward goals    Frequency    7X/week      PT Plan Current plan remains appropriate    Co-evaluation              AM-PAC PT "  6 Clicks" Daily Activity  Outcome Measure  Difficulty turning over in bed (including adjusting bedclothes, sheets and blankets)?: Unable Difficulty moving from lying on back to sitting on the side of the bed? : Unable Difficulty sitting down on and standing up from a chair with arms (e.g., wheelchair, bedside commode, etc,.)?: Unable Help needed moving to and from a bed to chair (including a wheelchair)?: A Little Help needed walking in hospital room?: A Little Help needed climbing 3-5 steps with a railing? : A Little 6  Click Score: 12    End of Session Equipment Utilized During Treatment: Gait belt Activity Tolerance: Patient tolerated treatment well Patient left: in chair;with call bell/phone within reach;with family/visitor present Nurse Communication: Mobility status PT Visit Diagnosis: Difficulty in walking, not elsewhere classified (R26.2)     Time: [id number] PT Time Calculation (min) (ACUTE ONLY): 25 min  Charges:  $Gait Training: 8-22 mins $Therapeutic Exercise: 8-22 mins                    G Codes:       Pg [phone]    Macyn Shropshire 11/12/2017, 12:27 PM

## 2017-11-12 NOTE — Progress Notes (Signed)
Subjective: Pt stable - pain ok   Objective: Vital signs in last 24 hours: Temp:  [97.4 F (36.3 C)-98.6 F (37 C)] 98 F (36.7 C) (11/17 0718) Pulse Rate:  [59-75] 74 (11/17 1010) Resp:  [14-17] 14 (11/17 0718) BP: (90-145)/(42-84) 129/49 (11/17 1010) SpO2:  [93 %-100 %] 98 % (11/17 0718)  Intake/Output from previous day: 11/16 0701 - 11/17 0700 In: 2970.3 [P.O.:480; I.V.:2436.7; IV Piggyback:53.6] Out: 1850 [Urine:1650; Blood:200] Intake/Output this shift: No intake/output data recorded.  Exam:  Dorsiflexion/Plantar flexion intact  Labs: Recent Labs    11/12/17 0527  HGB 9.3*   Recent Labs    11/12/17 0527  WBC 12.1*  RBC 3.16*  HCT 28.6*  PLT 186   Recent Labs    11/12/17 0527  NA 137  K 4.6  CL 104  CO2 26  BUN 13  CREATININE 0.60  GLUCOSE 122*  CALCIUM 8.7*   No results for input(s): LABPT, INR in the last 72 hours.  Assessment/Plan: Patient doing well.  Plan for possible discharge tomorrow.  Continue with physical therapy today.  Pain is fairly minimal at this time   Burnard Bunting 11/12/2017, 10:11 AM

## 2017-11-13 LAB — CBC
HEMATOCRIT: 26 % — AB (ref 36.0–46.0)
HEMOGLOBIN: 8.3 g/dL — AB (ref 12.0–15.0)
MCH: 29 pg (ref 26.0–34.0)
MCHC: 31.9 g/dL (ref 30.0–36.0)
MCV: 90.9 fL (ref 78.0–100.0)
Platelets: 155 10*3/uL (ref 150–400)
RBC: 2.86 MIL/uL — ABNORMAL LOW (ref 3.87–5.11)
RDW: 14.7 % (ref 11.5–15.5)
WBC: 10.2 10*3/uL (ref 4.0–10.5)

## 2017-11-13 NOTE — Progress Notes (Signed)
Subjective: Patient stable but having 2 episodes of nausea this morning.  Right hip is feeling better   Objective: Vital signs in last 24 hours: Temp:  [98.5 F (36.9 C)-100.3 F (37.9 C)] 99.1 F (37.3 C) (11/18 0451) Pulse Rate:  [75-88] 75 (11/18 1044) Resp:  [16-18] 18 (11/18 0451) BP: (107-145)/(48-91) 125/48 (11/18 1044) SpO2:  [96 %] 96 % (11/18 0451)  Intake/Output from previous day: 11/17 0701 - 11/18 0700 In: 1938.8 [P.O.:720; I.V.:1218.8] Out: 825 [Urine:825] Intake/Output this shift: No intake/output data recorded.  Exam:  Intact pulses distally Dorsiflexion/Plantar flexion intact  Labs: Recent Labs    11/12/17 0527 11/13/17 0429  HGB 9.3* 8.3*   Recent Labs    11/12/17 0527 11/13/17 0429  WBC 12.1* 10.2  RBC 3.16* 2.86*  HCT 28.6* 26.0*  PLT 186 155   Recent Labs    11/12/17 0527  NA 137  K 4.6  CL 104  CO2 26  BUN 13  CREATININE 0.60  GLUCOSE 122*  CALCIUM 8.7*   No results for input(s): LABPT, INR in the last 72 hours.  Assessment/Plan: Plan at this time is to hold discharge for now until nausea is improved.  We are trying combination of Reglan and Zofran.  Talk with the family and this is not an unusual problem for her.  Anticipate resolution by tomorrow   April [name] 11/13/2017, 11:58 AM

## 2017-11-13 NOTE — Progress Notes (Signed)
Physical Therapy Treatment Patient Details Name: April KannerMary Greek MRN: [id number] DOB: 10/07/1941 Today's Date: 11/13/2017    History of Present Illness Pt s/p R THR and with hx of L THR (2/18), MI and defibrillator placement    PT Comments    Pt continues very motivated and progressing with mobility but ltd this session by ongoing nausea.   Follow Up Recommendations  Home health PT     Equipment Recommendations  None recommended by PT    Recommendations for Other Services OT consult     Precautions / Restrictions Precautions Precautions: Fall Restrictions Weight Bearing Restrictions: No Other Position/Activity Restrictions: WBAT    Mobility  Bed Mobility Overal bed mobility: Needs Assistance Bed Mobility: Supine to Sit     Supine to sit: Min guard     General bed mobility comments: increased time with cues for sequence and use of L LE to self assist  Transfers Overall transfer level: Needs assistance Equipment used: Rolling walker (2 wheeled) Transfers: Sit to/from Stand Sit to Stand: Supervision         General transfer comment: min cues for LE management and use of UEs to self assist  Ambulation/Gait Ambulation/Gait assistance: Min guard;Supervision Ambulation Distance (Feet): 30 Feet(to/from bathroom) Assistive device: Rolling walker (2 wheeled) Gait Pattern/deviations: Step-through pattern;Decreased step length - right;Decreased step length - left;Shuffle;Trunk flexed Gait velocity: decr Gait velocity interpretation: Below normal speed for age/gender General Gait Details: cues for posture, position from RW and initial sequence   Stairs            Wheelchair Mobility    Modified Rankin (Stroke Patients Only)       Balance                                            Cognition Arousal/Alertness: Awake/alert Behavior During Therapy: WFL for tasks assessed/performed Overall Cognitive Status: Within Functional Limits for  tasks assessed                                        Exercises Total Joint Exercises Ankle Circles/Pumps: AROM;Both;15 reps;Supine Quad Sets: AROM;Both;10 reps;Supine Short Arc Quad: AROM;Right;10 reps;Seated Heel Slides: AAROM;Right;20 reps;Supine Hip ABduction/ADduction: AAROM;Right;15 reps;Supine    General Comments        Pertinent Vitals/Pain Pain Assessment: 0-10 Pain Score: 3  Pain Location: R hip Pain Descriptors / Indicators: Sore Pain Intervention(s): Limited activity within patient's tolerance;Monitored during session;Premedicated before session    Home Living                      Prior Function            PT Goals (current goals can now be found in the care plan section) Acute Rehab PT Goals Patient Stated Goal: Regain IND and walk with less pain PT Goal Formulation: With patient Time For Goal Achievement: 11/16/17 Potential to Achieve Goals: Good Progress towards PT goals: Progressing toward goals    Frequency    7X/week      PT Plan Current plan remains appropriate    Co-evaluation              AM-PAC PT "6 Clicks" Daily Activity  Outcome Measure  Difficulty turning over in bed (including adjusting bedclothes, sheets and blankets)?: A Lot  Difficulty moving from lying on back to sitting on the side of the bed? : A Lot Difficulty sitting down on and standing up from a chair with arms (e.g., wheelchair, bedside commode, etc,.)?: A Lot Help needed moving to and from a bed to chair (including a wheelchair)?: A Little Help needed walking in hospital room?: A Little Help needed climbing 3-5 steps with a railing? : A Little 6 Click Score: 15    End of Session Equipment Utilized During Treatment: Gait belt Activity Tolerance: Patient tolerated treatment well Patient left: in chair;with call bell/phone within reach Nurse Communication: Other (comment)(pt nauseous) PT Visit Diagnosis: Difficulty in walking, not  elsewhere classified (R26.2)     Time: 9811-9147 PT Time Calculation (min) (ACUTE ONLY): 23 min  Charges:  $Gait Training: 8-22 mins $Therapeutic Exercise: 8-22 mins                    G Codes:       Pg [phone]    Uri Covey 11/13/2017, 11:56 AM

## 2017-11-13 NOTE — Progress Notes (Signed)
Physical Therapy Treatment Patient Details Name: April KannerMary [name] MRN: [id number] DOB: 08/20/1941 Today's Date: 11/13/2017    History of Present Illness Pt s/p R THR and with hx of L THR (2/18), MI and defibrillator placement    PT Comments    Pt progressing well with mobility including negotiating stairs.  Pt tolerated well until end of session with onset of N/V - RN aware.   Follow Up Recommendations  Home health PT     Equipment Recommendations  None recommended by PT    Recommendations for Other Services OT consult     Precautions / Restrictions Precautions Precautions: Fall Restrictions Weight Bearing Restrictions: No Other Position/Activity Restrictions: WBAT    Mobility  Bed Mobility Overal bed mobility: Needs Assistance Bed Mobility: Supine to Sit     Supine to sit: Min guard     General bed mobility comments: Pt OOB and requests back to chair  Transfers Overall transfer level: Needs assistance Equipment used: Rolling walker (2 wheeled) Transfers: Sit to/from Stand Sit to Stand: Supervision         General transfer comment: min cues for LE management and use of UEs to self assist  Ambulation/Gait Ambulation/Gait assistance: Min guard;Supervision Ambulation Distance (Feet): 200 Feet Assistive device: Rolling walker (2 wheeled) Gait Pattern/deviations: Step-through pattern;Decreased step length - right;Decreased step length - left;Shuffle;Trunk flexed Gait velocity: decr Gait velocity interpretation: Below normal speed for age/gender General Gait Details: cues for posture, position from RW and initial sequence   Stairs Stairs: Yes   Stair Management: No rails;One rail Left;Step to pattern;Backwards;Forwards;With walker;With cane Number of Stairs: 7 General stair comments: 4 stairs fwd with cane and rail; 3 stairs bkwd with RW; cues for sequence and foot/RW/cane placement  Wheelchair Mobility    Modified Rankin (Stroke Patients Only)        Balance                                            Cognition Arousal/Alertness: Awake/alert Behavior During Therapy: WFL for tasks assessed/performed Overall Cognitive Status: Within Functional Limits for tasks assessed                                        Exercises Total Joint Exercises Ankle Circles/Pumps: AROM;Both;15 reps;Supine Quad Sets: AROM;Both;10 reps;Supine Short Arc Quad: AROM;Right;10 reps;Seated Heel Slides: AAROM;Right;20 reps;Supine Hip ABduction/ADduction: AAROM;Right;15 reps;Supine    General Comments        Pertinent Vitals/Pain Pain Assessment: 0-10 Pain Score: 3  Pain Location: R hip Pain Descriptors / Indicators: Sore Pain Intervention(s): Limited activity within patient's tolerance;Monitored during session;Premedicated before session(pt declines ice pack)    Home Living                      Prior Function            PT Goals (current goals can now be found in the care plan section) Acute Rehab PT Goals Patient Stated Goal: Regain IND and walk with less pain PT Goal Formulation: With patient Time For Goal Achievement: 11/16/17 Potential to Achieve Goals: Good Progress towards PT goals: Progressing toward goals    Frequency    7X/week      PT Plan Current plan remains appropriate    Co-evaluation  AM-PAC PT "6 Clicks" Daily Activity  Outcome Measure  Difficulty turning over in bed (including adjusting bedclothes, sheets and blankets)?: A Lot Difficulty moving from lying on back to sitting on the side of the bed? : A Lot Difficulty sitting down on and standing up from a chair with arms (e.g., wheelchair, bedside commode, etc,.)?: A Lot Help needed moving to and from a bed to chair (including a wheelchair)?: A Little Help needed walking in hospital room?: A Little Help needed climbing 3-5 steps with a railing? : A Little 6 Click Score: 15    End of Session Equipment  Utilized During Treatment: Gait belt Activity Tolerance: Patient tolerated treatment well Patient left: in chair;with call bell/phone within reach Nurse Communication: Other (comment)(N/V) PT Visit Diagnosis: Difficulty in walking, not elsewhere classified (R26.2)     Time: 1000-1024 PT Time Calculation (min) (ACUTE ONLY): 24 min  Charges:  $Gait Training: 23-37 mins $Therapeutic Exercise: 8-22 mins                    G Codes:       Pg [phone]    Niklas Chretien 11/13/2017, 12:02 PM

## 2017-11-14 ENCOUNTER — Encounter (HOSPITAL_COMMUNITY): Payer: Self-pay | Admitting: Orthopaedic Surgery

## 2017-11-14 LAB — CBC
HEMATOCRIT: 27.1 % — AB (ref 36.0–46.0)
HEMOGLOBIN: 8.5 g/dL — AB (ref 12.0–15.0)
MCH: 28.3 pg (ref 26.0–34.0)
MCHC: 31.4 g/dL (ref 30.0–36.0)
MCV: 90.3 fL (ref 78.0–100.0)
Platelets: 193 10*3/uL (ref 150–400)
RBC: 3 MIL/uL — AB (ref 3.87–5.11)
RDW: 14.3 % (ref 11.5–15.5)
WBC: 10.9 10*3/uL — ABNORMAL HIGH (ref 4.0–10.5)

## 2017-11-14 MED ORDER — ONDANSETRON 4 MG PO TBDP: 4.0000 mg | ORAL_TABLET | Freq: Three times a day (TID) | ORAL | 0 refills | Status: DC | PRN

## 2017-11-14 MED ORDER — TRAMADOL HCL 50 MG PO TABS: 50.0000 mg | ORAL_TABLET | Freq: Four times a day (QID) | ORAL | 0 refills | Status: DC | PRN

## 2017-11-14 NOTE — Progress Notes (Signed)
Patient ID: April [name], female   DOB: 05/28/1941, 77 y.o.   MRN: [id number] Feels much better today.  Vitals stable and minimal nausea.  Can discharge to home today.

## 2017-11-14 NOTE — Progress Notes (Signed)
Discharge planning, spoke with patient and daughter at bedside. Have chosen [facility] for HH PT. Contacted [facility] for referral. Has RW and 3n1. [phone] 

## 2017-11-14 NOTE — Anesthesia Postprocedure Evaluation (Signed)
Anesthesia Post Note  Patient: April [name]  Procedure(s) Performed: RIGHT TOTAL HIP ARTHROPLASTY ANTERIOR APPROACH (Right Hip)     Patient location during evaluation: PACU Anesthesia Type: MAC Level of consciousness: oriented and awake and alert Pain management: pain level controlled Vital Signs Assessment: post-procedure vital signs reviewed and stable Respiratory status: spontaneous breathing, respiratory function stable, patient connected to nasal cannula oxygen and nonlabored ventilation Cardiovascular status: blood pressure returned to baseline and stable Postop Assessment: no headache, no backache, no apparent nausea or vomiting and spinal receding Anesthetic complications: no    Last Vitals:  Vitals:   11/13/17 2037 11/14/17 0438  BP: (!) 115/49 (!) 128/47  Pulse: 72 73  Resp: 16 16  Temp: 37.5 C 36.8 C  SpO2: 90% 91%    Last Pain:  Vitals:   11/14/17 0438  TempSrc: Oral  PainSc:                  [name]

## 2017-11-14 NOTE — Discharge Summary (Signed)
Patient ID: April [name] MRN: [id number] DOB/AGE: March 15, 1941 76 y.o.  Admit date: 11/11/2017 Discharge date: 11/14/2017  Admission Diagnoses:  Principal Problem:   Unilateral primary osteoarthritis, right hip Active Problems:   Status post total replacement of right hip   Discharge Diagnoses:  Same  Past Medical History:  Diagnosis Date  . AICD (automatic cardioverter/defibrillator) present   . Arthritis   . HTN (hypertension)   . Myocardial infarction (HCC)    mild Mi 2000  . PONV (postoperative nausea and vomiting)   . Ventricular fibrillation (HCC)     Surgeries: Procedure(s): RIGHT TOTAL HIP ARTHROPLASTY ANTERIOR APPROACH on 11/11/2017   Consultants:   Discharged Condition: Improved  Hospital Course: April [name] is an 76 y.o. female who was admitted 11/11/2017 for operative treatment ofUnilateral primary osteoarthritis, right hip. Patient has severe unremitting pain that affects sleep, daily activities, and work/hobbies. After pre-op clearance the patient was taken to the operating room on 11/11/2017 and underwent  Procedure(s): RIGHT TOTAL HIP ARTHROPLASTY ANTERIOR APPROACH.    Patient was given perioperative antibiotics:  Anti-infectives (From admission, onward)   Start     Dose/Rate Route Frequency Ordered Stop   11/11/17 1700  clindamycin (CLEOCIN) IVPB 600 mg     600 mg 100 mL/hr over 30 Minutes Intravenous Every 6 hours 11/11/17 1401 11/11/17 2307   11/11/17 0709  clindamycin (CLEOCIN) 900 MG/50ML IVPB    Comments:  Curlene Dolphin   : cabinet override      11/11/17 4098 11/11/17 1042   11/11/17 0706  clindamycin (CLEOCIN) IVPB 900 mg     900 mg 100 mL/hr over 30 Minutes Intravenous On call to O.R. 11/11/17 0706 11/11/17 1112       Patient was given sequential compression devices, early ambulation, and chemoprophylaxis to prevent DVT.  Patient benefited maximally from hospital stay and there were no complications.    Recent vital signs:  Patient  Vitals for the past 24 hrs:  BP Temp Temp src Pulse Resp SpO2  11/14/17 0438 (!) 128/47 98.3 F (36.8 C) Oral 73 16 91 %  11/13/17 2037 (!) 115/49 99.5 F (37.5 C) Oral 72 16 90 %  11/13/17 1400 (!) 129/38 99.1 F (37.3 C) - 75 18 95 %  11/13/17 1044 (!) 125/48 - - 75 - -     Recent laboratory studies:  Recent Labs    11/12/17 0527 11/13/17 0429 11/14/17 0452  WBC 12.1* 10.2 10.9*  HGB 9.3* 8.3* 8.5*  HCT 28.6* 26.0* 27.1*  PLT 186 155 193  NA 137  --   --   K 4.6  --   --   CL 104  --   --   CO2 26  --   --   BUN 13  --   --   CREATININE 0.60  --   --   GLUCOSE 122*  --   --   CALCIUM 8.7*  --   --      Discharge Medications:   Allergies as of 11/14/2017      Reactions   Codeine Nausea And Vomiting   Penicillins Hives, Rash, Other (See Comments)   As a child Has patient had a PCN reaction causing immediate rash, facial/tongue/throat swelling, SOB or lightheadedness with hypotension:unsure Has patient had a PCN reaction causing severe rash involving mucus membranes or skin necrosis:unsure Has patient had a PCN reaction that required hospitalization:No Has patient had a PCN reaction occurring within the last 10 years:No If all of the above answers  are "NO", then may proceed with Cephalosporin use.      Medication List    TAKE these medications   acetaminophen 500 MG tablet Commonly known as:  TYLENOL Take 500-1,000 mg every 6 (six) hours as needed by mouth for moderate pain or headache.   aspirin EC 325 MG tablet Take 325 mg daily by mouth.   carvedilol 25 MG tablet Commonly known as:  COREG Take 2 tablets (50 mg total) by mouth 2 (two) times daily with a meal.   diphenhydrAMINE 25 MG tablet Commonly known as:  BENADRYL Take 50 mg by mouth at bedtime as needed for allergies.   enalapril 10 MG tablet Commonly known as:  VASOTEC TAKE 1 TABLET BY MOUTH TWO  TIMES DAILY What changed:    how much to take  how to take this  when to take this   Fish  Oil 1000 MG Caps Take 2 capsules (2,000 mg total) by mouth daily.   hydrochlorothiazide 25 MG tablet Commonly known as:  HYDRODIURIL Take 1 tablet by mouth  daily What changed:    how much to take  how to take this  when to take this  additional instructions   methocarbamol 500 MG tablet Commonly known as:  ROBAXIN Take 1 tablet (500 mg total) every 6 (six) hours as needed by mouth for muscle spasms.   ondansetron 4 MG disintegrating tablet Commonly known as:  ZOFRAN ODT Take 1 tablet (4 mg total) every 8 (eight) hours as needed by mouth for nausea or vomiting.   potassium chloride SA 20 MEQ tablet Commonly known as:  K-DUR,KLOR-CON Take 1 tablet by mouth  daily What changed:    how much to take  how to take this  when to take this  additional instructions   rosuvastatin 10 MG tablet Commonly known as:  CRESTOR Take 1 tablet (10 mg total) by mouth daily.   traMADol 50 MG tablet Commonly known as:  ULTRAM Take 1-2 tablets (50-100 mg total) every 6 (six) hours as needed by mouth.            Durable Medical Equipment  (From admission, onward)        Start     Ordered   11/11/17 1401  DME 3 n 1  Once     11/11/17 1401   11/11/17 1401  DME Walker rolling  Once    Question:  Patient needs a walker to treat with the following condition  Answer:  Status post total replacement of right hip   11/11/17 1401      Diagnostic Studies: Dg Pelvis Portable  Result Date: 11/11/2017 CLINICAL DATA:  Status post right total hip replacement. EXAM: PORTABLE PELVIS 1-2 VIEWS COMPARISON:  Celsius arm views obtained earlier today and radiographs dated 09/13/2017. FINDINGS: Interval right hip prosthesis in satisfactory position and alignment. No fracture or dislocation seen. Postoperative air in the soft tissues. Stable left hip prosthesis. IMPRESSION: Satisfactory postoperative appearance of a right hip prosthesis. Electronically Signed   By: Beckie SaltsSteven  Reid M.D.   On:  11/11/2017 12:51   Dg C-arm 1-60 Min-no Report  Result Date: 11/11/2017 Fluoroscopy was utilized by the requesting physician.  No radiographic interpretation.   Dg Hip Operative Unilat With Pelvis Right  Result Date: 11/11/2017 CLINICAL DATA:  Right hip replacement. EXAM: OPERATIVE right HIP (WITH PELVIS IF PERFORMED) 5 VIEWS TECHNIQUE: Fluoroscopic spot image(s) were submitted for interpretation post-operatively. FLUOROSCOPY TIME:  34 seconds. COMPARISON:  None. FINDINGS: Five intraoperative fluoroscopic images  of the right hip demonstrate the femoral and acetabular components to be well situated. Expected postoperative changes are noted in the surrounding soft tissues. No fracture or dislocation is noted. IMPRESSION: Status post right total hip arthroplasty. Electronically Signed   By: Lupita Raider, M.D.   On: 11/11/2017 12:01    Disposition: 06-Home-Health Care Svc  Discharge Instructions    Discharge patient   Complete by:  As directed    Discharge disposition:  01-Home or Self Care   Discharge patient date:  11/14/2017      Follow-up Information    Kathryne Hitch, MD Follow up in 2 week(s).   Specialty:  Orthopedic Surgery Contact information: [address] [place] [address] [phone]            Signed: Kathryne Hitch 11/14/2017, 7:09 AM

## 2017-11-15 ENCOUNTER — Telehealth (INDEPENDENT_AMBULATORY_CARE_PROVIDER_SITE_OTHER): Payer: Self-pay | Admitting: Orthopaedic Surgery

## 2017-11-15 NOTE — Telephone Encounter (Signed)
PT verbal orders needed  3 times a week for 1 week 2 times a week for 1 week  Needs today prior to thanksgiving.  CB # [id number]

## 2017-11-15 NOTE — Telephone Encounter (Signed)
Verbal order given  

## 2017-11-24 ENCOUNTER — Ambulatory Visit (INDEPENDENT_AMBULATORY_CARE_PROVIDER_SITE_OTHER): Payer: 59 | Admitting: Physician Assistant

## 2017-11-24 ENCOUNTER — Encounter (INDEPENDENT_AMBULATORY_CARE_PROVIDER_SITE_OTHER): Payer: Self-pay | Admitting: Physician Assistant

## 2017-11-24 DIAGNOSIS — Z96641 Presence of right artificial hip joint: Secondary | ICD-10-CM

## 2017-11-24 NOTE — Progress Notes (Signed)
Office Visit Note   Patient: April [name]           Date of Birth: 1941-05-04           MRN: [id number] Visit Date: 11/24/2017              Requested by: No referring provider defined for this encounter. PCP: Patient, No Pcp Per   Assessment & Plan: Visit Diagnoses:  1. Status post total replacement of right hip     Plan: Scar tissue mobilization encouraged.  She is able to get incision wetmargin of the incision now.  She will remain on her 325 mg aspirin that she was on prior to surgery.  Follow-up with [place] in 1 month sooner if there is any questions or concerns.  Follow-Up Instructions: Return in about 4 weeks (around 12/22/2017).   Orders:  No orders of the defined types were placed in this encounter.  No orders of the defined types were placed in this encounter.     Procedures: No procedures performed   Clinical Data: No additional findings.   Subjective: Chief Complaint  Patient presents with  . Right Hip - Pain, Routine Post Op    HPI April [name] returns today status post right total hip arthroplasty.  She is overall doing well.  She has had no fevers chills shortness of breath chest pain.  She has been on aspirin 325 mg once daily which she was taking prior to surgery.  Review of Systems Please see HPI otherwise negative  Objective: Vital Signs: There were no vitals taken for this visit.  Physical Exam  Constitutional: She is oriented to person, place, and time. She appears well-developed and well-nourished. No distress.  Pulmonary/Chest: Effort normal.  Neurological: She is alert and oriented to person, place, and time.  Skin: She is not diaphoretic.  Psychiatric: She has a normal mood and affect.    Ortho Exam Right hip surgical incisions healing well no signs of seroma.  Right calf supple nontender.  Good range of motion of the right hip Specialty Comments:  No specialty comments available.  Imaging: No results found.   PMFS  History: Patient Active Problem List   Diagnosis Date Noted  . Status post total replacement of right hip 11/11/2017  . Unilateral primary osteoarthritis, right hip 09/14/2017  . Hyperlipidemia 07/29/2017  . Unilateral primary osteoarthritis, left hip 01/28/2017  . Status post left hip replacement 01/28/2017  . 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 VENTRICULAR FIBRILLATION 02/03/2010  . Essential hypertension 01/09/2010   Past Medical History:  Diagnosis Date  . AICD (automatic cardioverter/defibrillator) present   . Arthritis   . HTN (hypertension)   . Myocardial infarction (HCC)    mild Mi 2000  . PONV (postoperative nausea and vomiting)   . Ventricular fibrillation (HCC)     Family History  Problem Relation Age of Onset  . Heart attack Mother   . Heart attack Father        x2  . Diabetes Maternal Aunt   . Diabetes Maternal Grandmother   . Heart Problems Maternal Grandmother     Past Surgical History:  Procedure Laterality Date  . IMPLANTABLE CARDIOVERTER DEFIBRILLATOR (ICD) GENERATOR CHANGE N/A 10/14/2014   Procedure: ICD GENERATOR CHANGE;  Surgeon: Marinus Maw, MD;  Location: [facility] CATH LAB;  Service: Cardiovascular;  Laterality: N/A;  . IMPLANTABLE CARDIOVERTER DEFIBRILLATOR IMPLANT  2000; 10-14-2014   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; generator change 09-2014 by  Dr Ladona Ridgelaylor  . TOTAL HIP ARTHROPLASTY Left 01/28/2017   Procedure: LEFT TOTAL HIP ARTHROPLASTY ANTERIOR APPROACH;  Surgeon: Kathryne Hitchhristopher Y Blackman, MD;  Location: [facility] ORS;  Service: Orthopedics;  Laterality: Left;  . TOTAL HIP ARTHROPLASTY Right 11/11/2017   Procedure: RIGHT TOTAL HIP ARTHROPLASTY ANTERIOR APPROACH;  Surgeon: Kathryne HitchBlackman, Christopher Y, MD;  Location: [facility] ORS;  Service: Orthopedics;  Laterality: Right;   Social History   Occupational History  . Not on file  Tobacco Use  . Smoking status: Former Smoker    Last attempt to quit: 10/27/1999    Years  since quitting: 18.0  . Smokeless tobacco: Never Used  Substance and Sexual Activity  . Alcohol use: No  . Drug use: No  . Sexual activity: Not on file

## 2017-12-08 ENCOUNTER — Ambulatory Visit (INDEPENDENT_AMBULATORY_CARE_PROVIDER_SITE_OTHER): Payer: 59 | Admitting: Physician Assistant

## 2017-12-08 ENCOUNTER — Encounter (INDEPENDENT_AMBULATORY_CARE_PROVIDER_SITE_OTHER): Payer: Self-pay | Admitting: Physician Assistant

## 2017-12-08 DIAGNOSIS — Z96641 Presence of right artificial hip joint: Secondary | ICD-10-CM

## 2017-12-08 NOTE — Progress Notes (Signed)
April [name] is now 27 days status post right total hip arthroplasty she comes in today due to swelling on the lateral aspect of her right hip.  She overall otherwise is doing well.  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  States whenever she rolls over on the hip that causes her to wake up at night.    Right hip: Surgical incisions healing well she did come scab in the right proximal incision.  There is no drainage or signs of infection.  Positive seroma.  Impression: Right hip seroma 27 days status post right total hip arthroplasty  Plan: Right hip prepped with Betadine and ethyl chloride used to anesthetize skin and then 70 cc of seroma aspirate is obtained.  Patient tolerates well.  She will follow back up at her regular scheduled appointment.

## 2017-12-11 ENCOUNTER — Other Ambulatory Visit: Payer: Self-pay | Admitting: Internal Medicine

## 2017-12-11 DIAGNOSIS — Z95810 Presence of automatic (implantable) cardiac defibrillator: Secondary | ICD-10-CM

## 2017-12-12 ENCOUNTER — Telehealth (INDEPENDENT_AMBULATORY_CARE_PROVIDER_SITE_OTHER): Payer: Self-pay | Admitting: Radiology

## 2017-12-12 NOTE — Telephone Encounter (Signed)
Patient says that the fluid is building up again and she has an appointment on 12/26/17, but wants to know if you all think she needs to be seen sooner?  Please call her to discuss.

## 2017-12-13 NOTE — Telephone Encounter (Signed)
Made appt tomorrow

## 2017-12-14 ENCOUNTER — Encounter (INDEPENDENT_AMBULATORY_CARE_PROVIDER_SITE_OTHER): Payer: Self-pay | Admitting: Orthopaedic Surgery

## 2017-12-14 ENCOUNTER — Ambulatory Visit (INDEPENDENT_AMBULATORY_CARE_PROVIDER_SITE_OTHER): Payer: 59 | Admitting: Physician Assistant

## 2017-12-14 DIAGNOSIS — Z96641 Presence of right artificial hip joint: Secondary | ICD-10-CM

## 2017-12-14 NOTE — Progress Notes (Signed)
April [name] returns today follow-up on the right hip she is status post right total hip arthroplasty 11/11/2017.  States she is has some swelling like her seroma drained today.  She is otherwise doing very well.  Physical exam: Right hip slight seroma.  No evidence of wound infection.  Surgical incisions well-healed.  She ambulates without any assistive devices.  Ambulates with a nonantalgic gait.  Impression: Status post right total hip arthroplasty  Plan: Right hips prepped with Betadine and ethyl chloride used to anesthetize skin and then 30 cc of seroma fluid is aspirated patient tolerates well.  We will see her back in 3 weeks sooner if there is any questions concerns.  No radiographs at return.

## 2017-12-26 ENCOUNTER — Ambulatory Visit (INDEPENDENT_AMBULATORY_CARE_PROVIDER_SITE_OTHER): Payer: 59 | Admitting: Physician Assistant

## 2018-01-04 ENCOUNTER — Ambulatory Visit (INDEPENDENT_AMBULATORY_CARE_PROVIDER_SITE_OTHER): Payer: 59 | Admitting: Physician Assistant

## 2018-01-04 ENCOUNTER — Encounter (INDEPENDENT_AMBULATORY_CARE_PROVIDER_SITE_OTHER): Payer: Self-pay | Admitting: Physician Assistant

## 2018-01-04 VITALS — Ht 64.0 in | Wt 139.0 lb

## 2018-01-04 DIAGNOSIS — Z96641 Presence of right artificial hip joint: Secondary | ICD-10-CM

## 2018-01-04 NOTE — Progress Notes (Signed)
Mrs. Lucci returns today for follow-up of her right total hip arthroplasty.  She is now 54 days postop.  She states she is doing very well.  She has no complaints.  She has had no return of the seroma.  She did walk a mile over the weekend.  Physical exam: General well-developed well-nourished female no acute distress mood affect appropriate. Right hip: Surgical incisions healed well no signs of infection.  No seroma.  Right hip good range of motion without pain.  She ambulates with a nonantalgic gait without any assistive device.  Impression: Status post right total hip arthroplasty  Plan: She will continue to work on scar tissue mobilization.  Continue work on strengthening of the hip.  Follow-up with [place] at 1 year postop sooner if there is any questions or concerns.  At that time we will obtain an AP pelvis and a lateral view of the right hip.

## 2018-02-07 ENCOUNTER — Ambulatory Visit (INDEPENDENT_AMBULATORY_CARE_PROVIDER_SITE_OTHER): Payer: 59 | Admitting: *Deleted

## 2018-02-07 DIAGNOSIS — I4901 Ventricular fibrillation: Secondary | ICD-10-CM | POA: Diagnosis not present

## 2018-02-07 NOTE — Progress Notes (Signed)
Remote ICD transmission.   

## 2018-02-08 ENCOUNTER — Encounter: Payer: Self-pay | Admitting: Cardiology

## 2018-02-09 ENCOUNTER — Ambulatory Visit (INDEPENDENT_AMBULATORY_CARE_PROVIDER_SITE_OTHER): Payer: 59 | Admitting: Physician Assistant

## 2018-02-15 LAB — CUP PACEART REMOTE DEVICE CHECK
Battery Remaining Longevity: 111 mo
Battery Voltage: 3 V
Date Time Interrogation Session: 20190212102825
HighPow Impedance: 41 Ohm
HighPow Impedance: 50 Ohm
Implantable Lead Location: 753860
Implantable Pulse Generator Implant Date: 20151019
Lead Channel Impedance Value: 361 Ohm
Lead Channel Pacing Threshold Pulse Width: 0.4 ms
Lead Channel Sensing Intrinsic Amplitude: 1.625 mV
Lead Channel Sensing Intrinsic Amplitude: 1.625 mV
Lead Channel Setting Pacing Amplitude: 2 V
Lead Channel Setting Pacing Pulse Width: 0.4 ms
Lead Channel Setting Sensing Sensitivity: 0.3 mV
MDC IDC LEAD IMPLANT DT: 20000821
MDC IDC MSMT LEADCHNL RV IMPEDANCE VALUE: 361 Ohm
MDC IDC MSMT LEADCHNL RV PACING THRESHOLD AMPLITUDE: 0.5 V
MDC IDC STAT BRADY RV PERCENT PACED: 0.01 %

## 2018-05-09 ENCOUNTER — Ambulatory Visit (INDEPENDENT_AMBULATORY_CARE_PROVIDER_SITE_OTHER): Payer: 59 | Admitting: *Deleted

## 2018-05-09 ENCOUNTER — Telehealth: Payer: Self-pay | Admitting: Cardiology

## 2018-05-09 DIAGNOSIS — I4901 Ventricular fibrillation: Secondary | ICD-10-CM

## 2018-05-09 NOTE — Telephone Encounter (Signed)
Spoke with pt and reminded pt of remote transmission that is due today. Pt verbalized understanding.   

## 2018-05-10 NOTE — Progress Notes (Signed)
Remote ICD transmission.   

## 2018-05-11 ENCOUNTER — Other Ambulatory Visit: Payer: Self-pay | Admitting: Internal Medicine

## 2018-05-11 LAB — CUP PACEART REMOTE DEVICE CHECK
Battery Voltage: 2.99 V
Brady Statistic RV Percent Paced: 0.01 %
Date Time Interrogation Session: 20190515194400
HIGH POWER IMPEDANCE MEASURED VALUE: 45 Ohm
HighPow Impedance: 58 Ohm
Implantable Lead Implant Date: 20000821
Implantable Lead Model: 6942
Lead Channel Impedance Value: 399 Ohm
Lead Channel Impedance Value: 418 Ohm
Lead Channel Pacing Threshold Amplitude: 0.5 V
Lead Channel Sensing Intrinsic Amplitude: 1.625 mV
Lead Channel Setting Pacing Pulse Width: 0.4 ms
Lead Channel Setting Sensing Sensitivity: 0.3 mV
MDC IDC LEAD LOCATION: 753860
MDC IDC MSMT BATTERY REMAINING LONGEVITY: 107 mo
MDC IDC MSMT LEADCHNL RV PACING THRESHOLD PULSEWIDTH: 0.4 ms
MDC IDC MSMT LEADCHNL RV SENSING INTR AMPL: 1.625 mV
MDC IDC PG IMPLANT DT: 20151019
MDC IDC SET LEADCHNL RV PACING AMPLITUDE: 2 V

## 2018-05-12 ENCOUNTER — Other Ambulatory Visit: Payer: Self-pay | Admitting: *Deleted

## 2018-05-12 DIAGNOSIS — Z95810 Presence of automatic (implantable) cardiac defibrillator: Secondary | ICD-10-CM

## 2018-05-12 MED ORDER — ENALAPRIL MALEATE 10 MG PO TABS: 10.0000 mg | ORAL_TABLET | Freq: Two times a day (BID) | ORAL | 0 refills | Status: DC

## 2018-05-17 ENCOUNTER — Telehealth: Payer: Self-pay | Admitting: *Deleted

## 2018-05-17 NOTE — Telephone Encounter (Signed)
lmtcb/sss 

## 2018-05-17 NOTE — Telephone Encounter (Signed)
-----   Message from Marinus Maw, MD sent at 05/16/2018  9:29 PM EDT ----- Remote device check reviewed. Histograms appropriate. Leads and battery stable for patient. Follow up as outlined above. No recommended changes. We will need to follow. Can we look at changing to integrated bipolar R waves?

## 2018-05-18 NOTE — Telephone Encounter (Signed)
April [name] calling back- explained what Dr. Ladona Ridgel wants to check with her ICD. She is agreeable. She has an appointment 06/06/18- confirmed that she will be there for this adjustment to her ICD sensing.

## 2018-06-06 ENCOUNTER — Ambulatory Visit (INDEPENDENT_AMBULATORY_CARE_PROVIDER_SITE_OTHER): Payer: 59 | Admitting: Internal Medicine

## 2018-06-06 ENCOUNTER — Encounter: Payer: Self-pay | Admitting: Internal Medicine

## 2018-06-06 VITALS — BP 140/60 | HR 57 | Ht 64.0 in | Wt 144.0 lb

## 2018-06-06 DIAGNOSIS — Z95810 Presence of automatic (implantable) cardiac defibrillator: Secondary | ICD-10-CM | POA: Diagnosis not present

## 2018-06-06 DIAGNOSIS — I4901 Ventricular fibrillation: Secondary | ICD-10-CM | POA: Diagnosis not present

## 2018-06-06 DIAGNOSIS — I10 Essential (primary) hypertension: Secondary | ICD-10-CM

## 2018-06-06 NOTE — Progress Notes (Signed)
ekg 

## 2018-06-06 NOTE — Patient Instructions (Signed)

## 2018-06-06 NOTE — Progress Notes (Addendum)
HPI April [name] returns today for followup. She is a pleasant 76 yo woman with a h/o VF arrest, s/p ICD implant and severe HTN but mostly white coat HTN. In the interim she has done well except for a little arthritis, especially in her back. She denies any ICD shocks. No chest pain or sob. She checks her blood pressure regularly and at home it has been stable with systolics typically in the 140-150 range. No syncope. Over the past 10 years her blood pressures in our office are always high. She has undergone left and right hip replacement surgery and has done well. No pain.   Allergies  Allergen Reactions  . Codeine Nausea And Vomiting  . Penicillins Hives, Rash and Other (See Comments)    As a child Has patient had a PCN reaction causing immediate rash, facial/tongue/throat swelling, SOB or lightheadedness with hypotension:unsure Has patient had a PCN reaction causing severe rash involving mucus membranes or skin necrosis:unsure Has patient had a PCN reaction that required hospitalization:No Has patient had a PCN reaction occurring within the last 10 years:No If all of the above answers are "NO", then may proceed with Cephalosporin use.      Current Outpatient Medications  Medication Sig Dispense Refill  . acetaminophen (TYLENOL) 500 MG tablet Take 500-1,000 mg every 6 (six) hours as needed by mouth for moderate pain or headache.     Marland Kitchen aspirin EC 325 MG tablet Take 325 mg daily by mouth.     . carvedilol (COREG) 25 MG tablet Take 2 tablets by mouth 2 times daily with a meal. Please make yearly appt with Dr. Ladona Ridgel. 1st attempt 360 tablet 0  . diphenhydrAMINE (BENADRYL) 25 MG tablet Take 50 mg by mouth at bedtime as needed for allergies.    Marland Kitchen enalapril (VASOTEC) 10 MG tablet Take 1 tablet (10 mg total) by mouth 2 (two) times daily. Must keep yearly appt with Dr. Ladona Ridgel. 180 tablet 0  . hydrochlorothiazide (HYDRODIURIL) 25 MG tablet Take 1 tablet by mouth  daily 90 tablet 0  .  methocarbamol (ROBAXIN) 500 MG tablet Take 1 tablet (500 mg total) every 6 (six) hours as needed by mouth for muscle spasms. 60 tablet 0  . Omega-3 Fatty Acids (FISH OIL) 1000 MG CAPS Take 2 capsules (2,000 mg total) by mouth daily.  0  . potassium chloride SA (K-DUR,KLOR-CON) 20 MEQ tablet Take 1 tablet by mouth  daily 90 tablet 0  . rosuvastatin (CRESTOR) 10 MG tablet TAKE 1 TABLET BY MOUTH  DAILY 90 tablet 0  . traMADol (ULTRAM) 50 MG tablet Take 1-2 tablets (50-100 mg total) every 6 (six) hours as needed by mouth. 60 tablet 0   No current facility-administered medications for this visit.      Past Medical History:  Diagnosis Date  . AICD (automatic cardioverter/defibrillator) present   . Arthritis   . HTN (hypertension)   . Myocardial infarction (HCC)    mild Mi 2000  . PONV (postoperative nausea and vomiting)   . Ventricular fibrillation (HCC)     ROS:   All systems reviewed and negative except as noted in the HPI.   Past Surgical History:  Procedure Laterality Date  . IMPLANTABLE CARDIOVERTER DEFIBRILLATOR (ICD) GENERATOR CHANGE N/A 10/14/2014   Procedure: ICD GENERATOR CHANGE;  Surgeon: Marinus Maw, MD;  Location: [facility] CATH LAB;  Service: Cardiovascular;  Laterality: N/A;  . IMPLANTABLE CARDIOVERTER DEFIBRILLATOR IMPLANT  2000; 10-14-2014   MDT single chamber ICD implanted  for VF arrest 2000; generator change 09-2014 by Dr Ladona Ridgel  . TOTAL HIP ARTHROPLASTY Left 01/28/2017   Procedure: LEFT TOTAL HIP ARTHROPLASTY ANTERIOR APPROACH;  Surgeon: Kathryne Hitch, MD;  Location: [facility] ORS;  Service: Orthopedics;  Laterality: Left;  . TOTAL HIP ARTHROPLASTY Right 11/11/2017   Procedure: RIGHT TOTAL HIP ARTHROPLASTY ANTERIOR APPROACH;  Surgeon: Kathryne Hitch, MD;  Location: [facility] ORS;  Service: Orthopedics;  Laterality: Right;     Family History  Problem Relation Age of Onset  . Heart attack Mother   . Heart attack Father        x2  . Diabetes Maternal Aunt   .  Diabetes Maternal Grandmother   . Heart Problems Maternal Grandmother      Social History   Socioeconomic History  . Marital status: Widowed    Spouse name: Not on file  . Number of children: Not on file  . Years of education: Not on file  . Highest education level: Not on file  Occupational History  . Not on file  Social Needs  . Financial resource strain: Not on file  . Food insecurity:    Worry: Not on file    Inability: Not on file  . Transportation needs:    Medical: Not on file    Non-medical: Not on file  Tobacco Use  . Smoking status: Former Smoker    Last attempt to quit: 10/27/1999    Years since quitting: 18.6  . Smokeless tobacco: Never Used  Substance and Sexual Activity  . Alcohol use: No  . Drug use: No  . Sexual activity: Not on file  Lifestyle  . Physical activity:    Days per week: Not on file    Minutes per session: Not on file  . Stress: Not on file  Relationships  . Social connections:    Talks on phone: Not on file    Gets together: Not on file    Attends religious service: Not on file    Active member of club or organization: Not on file    Attends meetings of clubs or organizations: Not on file    Relationship status: Not on file  . Intimate partner violence:    Fear of current or ex partner: Not on file    Emotionally abused: Not on file    Physically abused: Not on file    Forced sexual activity: Not on file  Other Topics Concern  . Not on file  Social History Narrative  . Not on file     BP 140/60   Pulse (!) 57   Ht 5\' 4"  (1.626 m)   Wt 144 lb (65.3 kg)   SpO2 98%   BMI 24.72 kg/m   Physical Exam:  Well appearing 77 yo woman, NAD HEENT: Unremarkable Neck:  6 cm JVD, no thyromegally Lymphatics:  No adenopathy Back:  No CVA tenderness Lungs:  Clear with no wheezes HEART:  Regular rate rhythm, no murmurs, no rubs, no clicks Abd:  soft, positive bowel sounds, no organomegally, no rebound, no guarding Ext:  2 plus  pulses, no edema, no cyanosis, no clubbing Skin:  No rashes no nodules Neuro:  CN II through XII intact, motor grossly intact  EKG - sinus brady  DEVICE  Normal device function.  See PaceArt for details.   Assess/Plan: 1. VF - she has had no recurrent ventricular arrhythmias. 2. HTN heart disease - she has had an improvement in her pressures. She is encouraged to continue  her current meds. 3. ICD - her medtronic ICD is working normally. Will recheck in several months.  [name]

## 2018-06-07 LAB — CUP PACEART INCLINIC DEVICE CHECK
Battery Remaining Longevity: 105 mo
Battery Voltage: 3 V
Brady Statistic RV Percent Paced: 0.01 %
Date Time Interrogation Session: 20190611123551
HighPow Impedance: 44 Ohm
HighPow Impedance: 55 Ohm
Implantable Lead Implant Date: 20000821
Implantable Lead Location: 753860
Implantable Lead Model: 6942
Implantable Pulse Generator Implant Date: 20151019
Lead Channel Impedance Value: 399 Ohm
Lead Channel Impedance Value: 399 Ohm
Lead Channel Pacing Threshold Amplitude: 0.5 V
Lead Channel Pacing Threshold Pulse Width: 0.4 ms
Lead Channel Sensing Intrinsic Amplitude: 4 mV
Lead Channel Setting Pacing Amplitude: 2 V
Lead Channel Setting Pacing Pulse Width: 0.4 ms
Lead Channel Setting Sensing Sensitivity: 0.3 mV

## 2018-07-03 ENCOUNTER — Other Ambulatory Visit: Payer: Self-pay | Admitting: Internal Medicine

## 2018-07-04 ENCOUNTER — Other Ambulatory Visit: Payer: Self-pay

## 2018-07-04 MED ORDER — HYDROCHLOROTHIAZIDE 25 MG PO TABS: ORAL_TABLET | ORAL | 1 refills | Status: DC

## 2018-08-09 ENCOUNTER — Telehealth: Payer: Self-pay | Admitting: Cardiology

## 2018-08-09 ENCOUNTER — Ambulatory Visit (INDEPENDENT_AMBULATORY_CARE_PROVIDER_SITE_OTHER): Payer: 59 | Admitting: *Deleted

## 2018-08-09 DIAGNOSIS — I4901 Ventricular fibrillation: Secondary | ICD-10-CM

## 2018-08-09 NOTE — Telephone Encounter (Signed)
Spoke with pt and reminded pt of remote transmission that is due today. Pt verbalized understanding.   

## 2018-08-10 ENCOUNTER — Encounter: Payer: Self-pay | Admitting: Cardiology

## 2018-08-10 NOTE — Progress Notes (Signed)
Remote ICD transmission.   

## 2018-09-03 LAB — CUP PACEART REMOTE DEVICE CHECK
Battery Remaining Longevity: 103 mo
Battery Voltage: 3 V
Brady Statistic RV Percent Paced: 0.01 %
Date Time Interrogation Session: 20190814092604
HIGH POWER IMPEDANCE MEASURED VALUE: 51 Ohm
HighPow Impedance: 41 Ohm
Implantable Lead Implant Date: 20000821
Implantable Lead Location: 753860
Implantable Pulse Generator Implant Date: 20151019
Lead Channel Impedance Value: 361 Ohm
Lead Channel Impedance Value: 361 Ohm
Lead Channel Sensing Intrinsic Amplitude: 6.5 mV
Lead Channel Sensing Intrinsic Amplitude: 6.5 mV
Lead Channel Setting Pacing Amplitude: 2 V
MDC IDC MSMT LEADCHNL RV PACING THRESHOLD AMPLITUDE: 0.625 V
MDC IDC MSMT LEADCHNL RV PACING THRESHOLD PULSEWIDTH: 0.4 ms
MDC IDC SET LEADCHNL RV PACING PULSEWIDTH: 0.4 ms
MDC IDC SET LEADCHNL RV SENSING SENSITIVITY: 0.3 mV

## 2018-10-03 ENCOUNTER — Other Ambulatory Visit: Payer: Self-pay | Admitting: Internal Medicine

## 2018-10-03 DIAGNOSIS — Z95810 Presence of automatic (implantable) cardiac defibrillator: Secondary | ICD-10-CM

## 2018-11-08 ENCOUNTER — Ambulatory Visit (INDEPENDENT_AMBULATORY_CARE_PROVIDER_SITE_OTHER): Payer: 59 | Admitting: *Deleted

## 2018-11-08 DIAGNOSIS — I4901 Ventricular fibrillation: Secondary | ICD-10-CM

## 2018-11-08 DIAGNOSIS — I428 Other cardiomyopathies: Secondary | ICD-10-CM

## 2018-11-08 DIAGNOSIS — I429 Cardiomyopathy, unspecified: Secondary | ICD-10-CM

## 2018-11-08 NOTE — Progress Notes (Signed)
Remote ICD transmission.   

## 2018-11-14 ENCOUNTER — Other Ambulatory Visit: Payer: Self-pay | Admitting: Internal Medicine

## 2018-11-14 DIAGNOSIS — Z95810 Presence of automatic (implantable) cardiac defibrillator: Secondary | ICD-10-CM

## 2019-01-04 ENCOUNTER — Encounter (INDEPENDENT_AMBULATORY_CARE_PROVIDER_SITE_OTHER): Payer: Self-pay | Admitting: Physician Assistant

## 2019-01-04 ENCOUNTER — Ambulatory Visit (INDEPENDENT_AMBULATORY_CARE_PROVIDER_SITE_OTHER): Payer: 59 | Admitting: Physician Assistant

## 2019-01-04 ENCOUNTER — Ambulatory Visit (INDEPENDENT_AMBULATORY_CARE_PROVIDER_SITE_OTHER): Payer: Self-pay

## 2019-01-04 DIAGNOSIS — Z96641 Presence of right artificial hip joint: Secondary | ICD-10-CM

## 2019-01-04 DIAGNOSIS — M25551 Pain in right hip: Secondary | ICD-10-CM | POA: Diagnosis not present

## 2019-01-04 NOTE — Progress Notes (Signed)
   Office Visit Note   Patient: April [name]           Date of Birth: 12-Jan-1941           MRN: [id number] Visit Date: 01/04/2019   HPI: Ms. April [name] returns now 1 year status post right total hip arthroplasty.  She is overall doing well.  She is now nearly 2 years status post left total hip arthroplasty.  Both hips are doing well.  She has no pain no complaints.  She has no concerns.  Review of systems please see HPI otherwise negative  Physical exam: General well-developed well-nourished pleasant female in no acute distress. Psych alert and oriented x3.   Bilateral hips: Good range of motion both hips without pain.  Ambulates without an antalgic gait and without any assistive device.  Radiographs AP pelvis lateral view of the right hip: Bilateral hips well located.  No acute fractures.  Well-seated bilateral total hip components without complicating features.  Impression: 1 year status post right total hip arthroplasty  Plan: Activities as tolerated.  Follow-up as needed.  Questions encouraged and answered.

## 2019-01-07 LAB — CUP PACEART REMOTE DEVICE CHECK
Battery Remaining Longevity: 97 mo
Battery Voltage: 3 V
Brady Statistic RV Percent Paced: 0.01 %
Date Time Interrogation Session: 20191113072823
HIGH POWER IMPEDANCE MEASURED VALUE: 46 Ohm
HIGH POWER IMPEDANCE MEASURED VALUE: 53 Ohm
Lead Channel Impedance Value: 399 Ohm
Lead Channel Impedance Value: 399 Ohm
Lead Channel Pacing Threshold Amplitude: 0.5 V
Lead Channel Pacing Threshold Pulse Width: 0.4 ms
Lead Channel Sensing Intrinsic Amplitude: 5.875 mV
Lead Channel Setting Pacing Amplitude: 2 V
Lead Channel Setting Pacing Pulse Width: 0.4 ms
MDC IDC LEAD IMPLANT DT: 20000821
MDC IDC LEAD LOCATION: 753860
MDC IDC MSMT LEADCHNL RV SENSING INTR AMPL: 5.875 mV
MDC IDC PG IMPLANT DT: 20151019
MDC IDC SET LEADCHNL RV SENSING SENSITIVITY: 0.3 mV

## 2019-02-10 ENCOUNTER — Other Ambulatory Visit: Payer: Self-pay | Admitting: Internal Medicine

## 2019-02-13 ENCOUNTER — Other Ambulatory Visit: Payer: Self-pay

## 2019-02-13 MED ORDER — HYDROCHLOROTHIAZIDE 25 MG PO TABS: ORAL_TABLET | ORAL | 1 refills | Status: DC

## 2019-04-23 ENCOUNTER — Telehealth (INDEPENDENT_AMBULATORY_CARE_PROVIDER_SITE_OTHER): Payer: Self-pay | Admitting: Orthopaedic Surgery

## 2019-04-23 ENCOUNTER — Other Ambulatory Visit (INDEPENDENT_AMBULATORY_CARE_PROVIDER_SITE_OTHER): Payer: Self-pay | Admitting: Orthopaedic Surgery

## 2019-04-23 MED ORDER — TRAMADOL HCL 50 MG PO TABS: 50.0000 mg | ORAL_TABLET | Freq: Four times a day (QID) | ORAL | 0 refills | Status: DC | PRN

## 2019-04-23 NOTE — Telephone Encounter (Signed)
I sent in some tramadol to [facility].

## 2019-04-23 NOTE — Telephone Encounter (Signed)
LMOM for patient letting her know we called in Rx

## 2019-04-23 NOTE — Telephone Encounter (Signed)
Please advise 

## 2019-04-23 NOTE — Telephone Encounter (Signed)
Patient request a call from Dr Magnus Ivan or Bronson Curb, she states she's having right side pain and wanted to know if there is something that could be called in for pain.

## 2019-04-24 ENCOUNTER — Telehealth (INDEPENDENT_AMBULATORY_CARE_PROVIDER_SITE_OTHER): Payer: Self-pay | Admitting: Orthopaedic Surgery

## 2019-04-24 MED ORDER — METHOCARBAMOL 500 MG PO TABS: 500.0000 mg | ORAL_TABLET | Freq: Four times a day (QID) | ORAL | 0 refills | Status: DC | PRN

## 2019-04-24 MED ORDER — GABAPENTIN 100 MG PO CAPS: 100.0000 mg | ORAL_CAPSULE | Freq: Two times a day (BID) | ORAL | 1 refills | Status: DC

## 2019-04-24 NOTE — Telephone Encounter (Signed)
I called and sw pt advised of below. Will call with any questions.

## 2019-04-24 NOTE — Telephone Encounter (Signed)
Patient calling to say that the Tramadol is not touching the pain. She state that she is unable to tolerate pain meds because they make her sick on her stomach  and she cannot take steroids.  Please advise   [facility]

## 2019-04-24 NOTE — Telephone Encounter (Signed)
I sent in a muscle relaxant methocarbamol (Robaxin) as well as some gabapentin which is a medication which helps with pain and nerve pathways.  Hopefully this can help.

## 2019-04-24 NOTE — Telephone Encounter (Signed)
Please advise 

## 2019-04-30 ENCOUNTER — Other Ambulatory Visit: Payer: Self-pay | Admitting: Internal Medicine

## 2019-04-30 DIAGNOSIS — Z95810 Presence of automatic (implantable) cardiac defibrillator: Secondary | ICD-10-CM

## 2019-05-01 ENCOUNTER — Other Ambulatory Visit: Payer: Self-pay

## 2019-05-01 ENCOUNTER — Ambulatory Visit (INDEPENDENT_AMBULATORY_CARE_PROVIDER_SITE_OTHER): Payer: 59 | Admitting: Orthopaedic Surgery

## 2019-05-01 ENCOUNTER — Ambulatory Visit: Payer: 59

## 2019-05-01 ENCOUNTER — Encounter: Payer: Self-pay | Admitting: Orthopaedic Surgery

## 2019-05-01 DIAGNOSIS — M7061 Trochanteric bursitis, right hip: Secondary | ICD-10-CM | POA: Diagnosis not present

## 2019-05-01 DIAGNOSIS — M79604 Pain in right leg: Secondary | ICD-10-CM | POA: Diagnosis not present

## 2019-05-01 DIAGNOSIS — M545 Low back pain: Secondary | ICD-10-CM | POA: Diagnosis not present

## 2019-05-01 MED ORDER — METHYLPREDNISOLONE ACETATE 40 MG/ML IJ SUSP
40.0000 mg | INTRAMUSCULAR | Status: AC | PRN
  Administered 2019-05-01: 16:00:00 40 mg via INTRA_ARTICULAR

## 2019-05-01 MED ORDER — LIDOCAINE HCL 1 % IJ SOLN
3.0000 mL | INTRAMUSCULAR | Status: AC | PRN
  Administered 2019-05-01: 3 mL

## 2019-05-01 NOTE — Progress Notes (Signed)
Office Visit Note   Patient: April [name]           Date of Birth: June 16, 1941           MRN: [id number] Visit Date: 05/01/2019              Requested by: No referring provider defined for this encounter. PCP: Patient, No Pcp Per   Assessment & Plan: Visit Diagnoses:  1. Pain in right leg   2. Trochanteric bursitis, right hip     Plan: I do feel that this is more of a right hip trochanteric bursitis and sciatica.  I showed her activities to avoid and talked in detail about stretching to try.  I showed her how to do these exercises and she demonstrated them back to me.  She is the perfect candidate for trochanteric injection with a steroid over the right side and explained the rationale behind this as well as the risk and benefits and she did agree to proceed with an injection.  She tolerated this well.  All question concerns were answered and addressed.  Follow-up can be as needed.  Follow-Up Instructions: Return if symptoms worsen or fail to improve.   Orders:  Orders Placed This Encounter  Procedures  . Large Joint Inj  . XR Lumbar Spine 2-3 Views   No orders of the defined types were placed in this encounter.     Procedures: Large Joint Inj: R greater trochanter on 05/01/2019 3:49 PM Indications: pain and diagnostic evaluation Details: 22 G 1.5 in needle, lateral approach  Arthrogram: No  Medications: 3 mL lidocaine 1 %; 40 mg methylPREDNISolone acetate 40 MG/ML Outcome: tolerated well, no immediate complications Procedure, treatment alternatives, risks and benefits explained, specific risks discussed. Consent was given by the patient. Immediately prior to procedure a time out was called to verify the correct patient, procedure, equipment, support staff and site/side marked as required. Patient was prepped and draped in the usual sterile fashion.       Clinical Data: No additional findings.   Subjective: Chief Complaint  Patient presents with  . Lower Back -  Pain  The patient is well-known to me.  She has had a history of both hips being replaced with the right one being replaced in November 2018.  She is very active with walking.  This is bothered her for about 3 weeks now and she points the trochanteric area as a source of her pain but also some sciatic component.  This does not radiate down the foot and it does to the knee but on the side of the leg.  She denies any leg weakness and denies any numbness and tingling in her feet.  She is very active 78 years old.  HPI  Review of Systems She currently denies any headache, chest pain, shortness of breath, fever, chills, nausea, vomiting  Objective: Vital Signs: There were no vitals taken for this visit.  Physical Exam She is alert and orient x3 and in no acute distress Ortho Exam Examination of both hips show the move fluidly.  She has pain to palpation of the trochanteric area of her right hip and the IT band.  She has slight pain in the sciatic area but that is only slight.  She is neurologically intact in her bilateral lower extremities with normal muscle and sensory function. Specialty Comments:  No specialty comments available.  Imaging: Xr Lumbar Spine 2-3 Views  Result Date: 05/01/2019 An AP and lateral lumbar spine shows no  acute findings.  The disc heights and alignment are well maintained and age-appropriate.    PMFS History: Patient Active Problem List   Diagnosis Date Noted  . Status post total replacement of right hip 11/11/2017  . Unilateral primary osteoarthritis, right hip 09/14/2017  . Hyperlipidemia 07/29/2017  . Unilateral primary osteoarthritis, left hip 01/28/2017  . Status post left hip replacement 01/28/2017  . 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 VENTRICULAR FIBRILLATION 02/03/2010  . Essential hypertension 01/09/2010   Past Medical History:  Diagnosis Date  . AICD (automatic cardioverter/defibrillator) present   . Arthritis    . HTN (hypertension)   . Myocardial infarction (HCC)    mild Mi 2000  . PONV (postoperative nausea and vomiting)   . Ventricular fibrillation (HCC)     Family History  Problem Relation Age of Onset  . Heart attack Mother   . Heart attack Father        x2  . Diabetes Maternal Aunt   . Diabetes Maternal Grandmother   . Heart Problems Maternal Grandmother     Past Surgical History:  Procedure Laterality Date  . IMPLANTABLE CARDIOVERTER DEFIBRILLATOR (ICD) GENERATOR CHANGE N/A 10/14/2014   Procedure: ICD GENERATOR CHANGE;  Surgeon: Marinus Maw, MD;  Location: [facility] CATH LAB;  Service: Cardiovascular;  Laterality: N/A;  . IMPLANTABLE CARDIOVERTER DEFIBRILLATOR IMPLANT  2000; 10-14-2014   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; generator change 09-2014 by Dr Ladona Ridgel  . TOTAL HIP ARTHROPLASTY Left 01/28/2017   Procedure: LEFT TOTAL HIP ARTHROPLASTY ANTERIOR APPROACH;  Surgeon: Kathryne Hitch, MD;  Location: [facility] ORS;  Service: Orthopedics;  Laterality: Left;  . TOTAL HIP ARTHROPLASTY Right 11/11/2017   Procedure: RIGHT TOTAL HIP ARTHROPLASTY ANTERIOR APPROACH;  Surgeon: Kathryne Hitch, MD;  Location: [facility] ORS;  Service: Orthopedics;  Laterality: Right;   Social History   Occupational History  . Not on file  Tobacco Use  . Smoking status: Former Smoker    Last attempt to quit: 10/27/1999    Years since quitting: 19.5  . Smokeless tobacco: Never Used  Substance and Sexual Activity  . Alcohol use: No  . Drug use: No  . Sexual activity: Not on file

## 2019-05-04 ENCOUNTER — Telehealth: Payer: Self-pay | Admitting: Internal Medicine

## 2019-05-04 NOTE — Telephone Encounter (Signed)
Returned call to Pt.  Advised ok to take Aleve.  Advised Pt to reduce ASA to 81 mg daily instead of full strength.  Pt thanked nurse for call.

## 2019-05-04 NOTE — Telephone Encounter (Signed)
Pt wants to know if she can take Aleve. She is having a flare of bursitis, and Tylenol does not seem to be resolving her pain. She got a cortisone shot and she is waiting for the shot to take effect.

## 2019-05-10 ENCOUNTER — Other Ambulatory Visit (INDEPENDENT_AMBULATORY_CARE_PROVIDER_SITE_OTHER): Payer: Self-pay | Admitting: Orthopaedic Surgery

## 2019-05-10 NOTE — Telephone Encounter (Signed)
Ok to rf? 

## 2019-06-04 ENCOUNTER — Telehealth: Payer: Self-pay | Admitting: Internal Medicine

## 2019-06-04 NOTE — Telephone Encounter (Signed)
New message    Spoke w/ pt about appt on 06.16.20 with Dr. Lovena Le. Pt changed to phone visit with Dr. Lovena Le via doxemity.       Virtual Visit Pre-Appointment Phone Call  "(Name), I am calling you today to discuss your upcoming appointment. We are currently trying to limit exposure to the virus that causes COVID-19 by seeing patients at home rather than in the office."  1. "What is the BEST phone number to call the day of the visit?" - include this in appointment notes  2. Do you have or have access to (through a family member/friend) a smartphone with video capability that we can use for your visit?" a. If yes - list this number in appt notes as cell (if different from BEST phone #) and list the appointment type as a VIDEO visit in appointment notes b. If no - list the appointment type as a PHONE visit in appointment notes  Confirm consent - "In the setting of the current Covid19 crisis, you are scheduled for a (phone or video) visit with your provider on (date) at (time).  Just as we do with many in-office visits, in order for you to participate in this visit, we must obtain consent.  If you'd like, I can send this to your mychart (if signed up) or email for you to review.  Otherwise, I can obtain your verbal consent now.  All virtual visits are billed to your insurance company just like a normal visit would be.  By agreeing to a virtual visit, we'd like you to understand that the technology does not allow for your provider to perform an examination, and thus may limit your provider's ability to fully assess your condition. If your provider identifies any concerns that need to be evaluated in person, we will make arrangements to do so.  Finally, though the technology is pretty good, we cannot assure that it will always work on either your or our end, and in the setting of a video visit, we may have to convert it to a phone-only visit.  In either situation, we cannot ensure that we have a  secure connection.  Are you willing to proceed?" STAFF: Did the patient verbally acknowledge consent to telehealth visit? Document YES/NO here: YES  3. Advise patient to be prepared - "Two hours prior to your appointment, go ahead and check your blood pressure, pulse, oxygen saturation, and your weight (if you have the equipment to check those) and write them all down. When your visit starts, your provider will ask you for this information. If you have an Apple Watch or Kardia device, please plan to have heart rate information ready on the day of your appointment. Please have a pen and paper handy nearby the day of the visit as well."  4. Give patient instructions for MyChart download to smartphone OR Doximity/Doxy.me as below if video visit (depending on what platform provider is using)  5. Inform patient they will receive a phone call 15 minutes prior to their appointment time (may be from unknown caller ID) so they should be prepared to answer    April [name] has been deemed a candidate for a follow-up tele-health visit to limit community exposure during the Covid-19 pandemic. I spoke with the patient via phone to ensure availability of phone/video source, confirm preferred email & phone number, and discuss instructions and expectations.  I reminded April [name] to be prepared with any vital sign and/or heart rhythm information  that could potentially be obtained via home monitoring, at the time of her visit. I reminded April [name] to expect a phone call prior to her visit.  Ashland P Edwards 06/04/2019 3:00 PM   INSTRUCTIONS FOR DOWNLOADING THE MYCHART APP TO SMARTPHONE  - The patient must first make sure to have activated MyChart and know their login information - If Apple, go to [employer] and type in MyChart in the search bar and download the app. If Android, ask patient to go to [employer] and type in [place] in the search bar and download the app. The app is  free but as with any other app downloads, their phone may require them to verify saved payment information or Apple/Android password.  - The patient will need to then log into the app with their MyChart username and password, and select [facility] as their healthcare provider to link the account. When it is time for your visit, go to the MyChart app, find appointments, and click Begin Video Visit. Be sure to Select Allow for your device to access the Microphone and Camera for your visit. You will then be connected, and your provider will be with you shortly.  **If they have any issues connecting, or need assistance please contact MyChart service desk [phone])**  **If using a computer, in order to ensure the best quality for their visit they will need to use either of the following Internet Browsers: [employer] or Google Chrome**  IF USING DOXIMITY or DOXY.ME - The patient will receive a link just prior to their visit by text.     FULL LENGTH CONSENT FOR TELE-HEALTH VISIT   I hereby voluntarily request, consent and authorize [facility] and its employed or contracted physicians, physician assistants, nurse practitioners or other licensed health care professionals (the Practitioner), to provide me with telemedicine health care services (the Services") as deemed necessary by the treating Practitioner. I acknowledge and consent to receive the Services by the Practitioner via telemedicine. I understand that the telemedicine visit will involve communicating with the Practitioner through live audiovisual communication technology and the disclosure of certain medical information by electronic transmission. I acknowledge that I have been given the opportunity to request an in-person assessment or other available alternative prior to the telemedicine visit and am voluntarily participating in the telemedicine visit.  I understand that I have the right to withhold or withdraw my  consent to the use of telemedicine in the course of my care at any time, without affecting my right to future care or treatment, and that the Practitioner or I may terminate the telemedicine visit at any time. I understand that I have the right to inspect all information obtained and/or recorded in the course of the telemedicine visit and may receive copies of available information for a reasonable fee.  I understand that some of the potential risks of receiving the Services via telemedicine include:   Delay or interruption in medical evaluation due to technological equipment failure or disruption;  Information transmitted may not be sufficient (e.g. poor resolution of images) to allow for appropriate medical decision making by the Practitioner; and/or   In rare instances, security protocols could fail, causing a breach of personal health information.  Furthermore, I acknowledge that it is my responsibility to provide information about my medical history, conditions and care that is complete and accurate to the best of my ability. I acknowledge that Practitioner's advice, recommendations, and/or decision may be based on factors not within  their control, such as incomplete or inaccurate data provided by me or distortions of diagnostic images or specimens that may result from electronic transmissions. I understand that the practice of medicine is not an exact science and that Practitioner makes no warranties or guarantees regarding treatment outcomes. I acknowledge that I will receive a copy of this consent concurrently upon execution via email to the email address I last provided but may also request a printed copy by calling the office of [facility].    I understand that my insurance will be billed for this visit.   I have read or had this consent read to me.  I understand the contents of this consent, which adequately explains the benefits and risks of the Services being provided via telemedicine.    I have been provided ample opportunity to ask questions regarding this consent and the Services and have had my questions answered to my satisfaction.  I give my informed consent for the services to be provided through the use of telemedicine in my medical care  By participating in this telemedicine visit I agree to the above.

## 2019-06-12 ENCOUNTER — Telehealth (INDEPENDENT_AMBULATORY_CARE_PROVIDER_SITE_OTHER): Payer: 59 | Admitting: Internal Medicine

## 2019-06-12 ENCOUNTER — Other Ambulatory Visit: Payer: Self-pay

## 2019-06-12 ENCOUNTER — Telehealth: Payer: Self-pay

## 2019-06-12 DIAGNOSIS — Z95810 Presence of automatic (implantable) cardiac defibrillator: Secondary | ICD-10-CM

## 2019-06-12 DIAGNOSIS — I10 Essential (primary) hypertension: Secondary | ICD-10-CM

## 2019-06-12 DIAGNOSIS — I4901 Ventricular fibrillation: Secondary | ICD-10-CM

## 2019-06-12 MED ORDER — HYDROCHLOROTHIAZIDE 25 MG PO TABS: ORAL_TABLET | ORAL | 3 refills | Status: DC

## 2019-06-12 MED ORDER — FISH OIL 1000 MG PO CAPS: 2000.0000 mg | ORAL_CAPSULE | Freq: Every day | ORAL | 3 refills | Status: AC

## 2019-06-12 MED ORDER — CARVEDILOL 25 MG PO TABS: ORAL_TABLET | ORAL | 3 refills | Status: DC

## 2019-06-12 MED ORDER — POTASSIUM CHLORIDE CRYS ER 20 MEQ PO TBCR: 20.0000 meq | EXTENDED_RELEASE_TABLET | Freq: Every day | ORAL | 3 refills | Status: DC

## 2019-06-12 MED ORDER — ENALAPRIL MALEATE 10 MG PO TABS: 10.0000 mg | ORAL_TABLET | Freq: Two times a day (BID) | ORAL | 3 refills | Status: DC

## 2019-06-12 MED ORDER — ROSUVASTATIN CALCIUM 10 MG PO TABS: 10.0000 mg | ORAL_TABLET | Freq: Every day | ORAL | 3 refills | Status: DC

## 2019-06-12 NOTE — Addendum Note (Signed)
Addended by: Willeen Cass A on: 06/12/2019 03:52 PM   Modules accepted: Orders

## 2019-06-12 NOTE — Progress Notes (Signed)
Electrophysiology TeleHealth Note   Due to national recommendations of social distancing due to COVID 19, an audio/video telehealth visit is felt to be most appropriate for this patient at this time.  See MyChart message from today for the patient's consent to telehealth for [facility].   Date:  06/12/2019   ID:  April [name], DOB 06-Oct-1941, MRN [id number]  Location: patient's home  Provider location: [address] [place]  Evaluation Performed: Follow-up visit  PCP:  Patient, No Pcp Per  Cardiologist:  No primary care provider on file.  Electrophysiologist:  Dr Ladona Ridgel  Chief Complaint:  "I'm doing good."  History of Present Illness:    April [name] is a 78 y.o. female who presents via Web designer for a telehealth visit today. She is a pleasant 78 yo woman with HTN,  Since last being seen in our clinic, the patient reports doing very well.  Today, she denies symptoms of palpitations, chest pain, shortness of breath,  lower extremity edema, dizziness, presyncope, or syncope.  The patient is otherwise without complaint today.  The patient denies symptoms of fevers, chills, cough, or new SOB worrisome for COVID 19.  Past Medical History:  Diagnosis Date  . AICD (automatic cardioverter/defibrillator) present   . Arthritis   . HTN (hypertension)   . Myocardial infarction (HCC)    mild Mi 2000  . PONV (postoperative nausea and vomiting)   . Ventricular fibrillation [facility])     Past Surgical History:  Procedure Laterality Date  . IMPLANTABLE CARDIOVERTER DEFIBRILLATOR (ICD) GENERATOR CHANGE N/A 10/14/2014   Procedure: ICD GENERATOR CHANGE;  Surgeon: Marinus Maw, MD;  Location: [facility] CATH LAB;  Service: Cardiovascular;  Laterality: N/A;  . IMPLANTABLE CARDIOVERTER DEFIBRILLATOR IMPLANT  2000; 10-14-2014   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; generator change 09-2014 by Dr Ladona Ridgel  . TOTAL HIP ARTHROPLASTY Left 01/28/2017   Procedure:  LEFT TOTAL HIP ARTHROPLASTY ANTERIOR APPROACH;  Surgeon: Kathryne Hitch, MD;  Location: [facility] ORS;  Service: Orthopedics;  Laterality: Left;  . TOTAL HIP ARTHROPLASTY Right 11/11/2017   Procedure: RIGHT TOTAL HIP ARTHROPLASTY ANTERIOR APPROACH;  Surgeon: Kathryne Hitch, MD;  Location: [facility] ORS;  Service: Orthopedics;  Laterality: Right;    Current Outpatient Medications  Medication Sig Dispense Refill  . acetaminophen (TYLENOL) 500 MG tablet Take 500-1,000 mg every 6 (six) hours as needed by mouth for moderate pain or headache.     Marland Kitchen aspirin EC 325 MG tablet Take 325 mg daily by mouth.     . carvedilol (COREG) 25 MG tablet TAKE 2 TABLETS BY MOUTH 2  TIMES DAILY WITH A MEAL. 360 tablet 2  . diphenhydrAMINE (BENADRYL) 25 MG tablet Take 50 mg by mouth at bedtime as needed for allergies.    Marland Kitchen enalapril (VASOTEC) 10 MG tablet TAKE 1 TABLET BY MOUTH TWO  TIMES DAILY 180 tablet 1  . gabapentin (NEURONTIN) 100 MG capsule Take 1 capsule (100 mg total) by mouth 2 (two) times daily. 30 capsule 1  . hydrochlorothiazide (HYDRODIURIL) 25 MG tablet Take 1 tablet by mouth  daily 90 tablet 1  . methocarbamol (ROBAXIN) 500 MG tablet TAKE 1 TABLET EVERY 6 HOURS AS NEEDED FOR MUSCLE SPASM. 60 tablet 0  . Omega-3 Fatty Acids (FISH OIL) 1000 MG CAPS Take 2 capsules (2,000 mg total) by mouth daily.  0  . potassium chloride SA (K-DUR,KLOR-CON) 20 MEQ tablet TAKE 1 TABLET BY MOUTH  DAILY 90 tablet 2  .  rosuvastatin (CRESTOR) 10 MG tablet TAKE 1 TABLET BY MOUTH  DAILY 90 tablet 1  . traMADol (ULTRAM) 50 MG tablet Take 1-2 tablets (50-100 mg total) by mouth every 6 (six) hours as needed. 40 tablet 0   No current facility-administered medications for this visit.     Allergies:   Codeine and Penicillins   Social History:  The patient  reports that she quit smoking about 19 years ago. She has never used smokeless tobacco. She reports that she does not drink alcohol or use drugs.   Family History:  The  patient's  family history includes Diabetes in her maternal aunt and maternal grandmother; Heart Problems in her maternal grandmother; Heart attack in her father and mother.   ROS:  Please see the history of present illness.   All other systems are personally reviewed and negative.    Exam:    Vital Signs:  BP - 140/83   Labs/Other Tests and Data Reviewed:    Recent Labs: No results found for requested labs within last 8760 hours.   Wt Readings from Last 3 Encounters:  06/06/18 144 lb (65.3 kg)  01/04/18 139 lb (63 kg)  11/11/17 153 lb (69.4 kg)     Other studies personally reviewed:  Last device remote is reviewed from [facility] PDF dated 1/20 which reveals normal device function, no arrhythmias    ASSESSMENT & PLAN:    1.  HTN - her bp is improved. She is encouraged to avoid salty foods. No change in meds. 2. VF - she has had no recurrent arrhythmias.  3. ICD - her device has not been check in over 5 months. We will schedule a device check remotely. 4. COVID 19 screen The patient denies symptoms of COVID 19 at this time.  The importance of social distancing was discussed today.  Follow-up:  6 months Next remote: ASAP  Current medicines are reviewed at length with the patient today.   The patient does not have concerns regarding her medicines.  The following changes were made today:  none  Labs/ tests ordered today include: none No orders of the defined types were placed in this encounter.    Patient Risk:  after full review of this patients clinical status, I feel that they are at moderate risk at this time.  Today, I have spent 15 minutes with the patient with telehealth technology discussing all of the above .    Signed, Cristopher Peru, MD  06/12/2019 3:20 PM     [facility] [address] [place] [address] [phone] (office) [phone] (fax)

## 2019-06-12 NOTE — Telephone Encounter (Signed)
I spoke with pt and let her know that Dr. Lovena Le wanted a manual transmission with her home monitor as soon as possible. Pt agreed to send one tomorrow. I gave her my direct office number to call me if she have any problems or issues sending the transmission.

## 2019-06-13 ENCOUNTER — Ambulatory Visit (INDEPENDENT_AMBULATORY_CARE_PROVIDER_SITE_OTHER): Payer: 59 | Admitting: *Deleted

## 2019-06-13 DIAGNOSIS — I4901 Ventricular fibrillation: Secondary | ICD-10-CM

## 2019-06-13 NOTE — Telephone Encounter (Signed)
I help the pt send a transmission with her home monitor. Transmission received 06-13-2019.

## 2019-06-13 NOTE — Telephone Encounter (Signed)
LMOVM for pt to return call to DC. 

## 2019-06-14 LAB — CUP PACEART REMOTE DEVICE CHECK
Battery Remaining Longevity: 88 mo
Battery Voltage: 2.99 V
Brady Statistic RV Percent Paced: 0.02 %
Date Time Interrogation Session: 20200617174935
HighPow Impedance: 45 Ohm
HighPow Impedance: 58 Ohm
Implantable Lead Implant Date: 20000821
Implantable Lead Location: 753860
Implantable Lead Model: 6942
Implantable Pulse Generator Implant Date: 20151019
Lead Channel Impedance Value: 361 Ohm
Lead Channel Impedance Value: 361 Ohm
Lead Channel Pacing Threshold Amplitude: 0.5 V
Lead Channel Pacing Threshold Pulse Width: 0.4 ms
Lead Channel Sensing Intrinsic Amplitude: 5.5 mV
Lead Channel Setting Pacing Amplitude: 2 V
Lead Channel Setting Pacing Pulse Width: 0.4 ms
Lead Channel Setting Sensing Sensitivity: 0.3 mV

## 2019-06-24 ENCOUNTER — Encounter: Payer: Self-pay | Admitting: Cardiology

## 2019-06-24 NOTE — Progress Notes (Signed)
Remote ICD transmission.   

## 2019-08-16 ENCOUNTER — Telehealth: Payer: Self-pay | Admitting: Internal Medicine

## 2019-08-16 NOTE — Telephone Encounter (Signed)
Per pt call she has some paper work she would like filled out and has some questions about this she would like the nurse to give her a call back.

## 2019-08-17 NOTE — Telephone Encounter (Signed)
Returned call to Pt.  Pt needs a short form filled out for her employer d/t Pt's yearly visit was virtual.  Advised to fax to [phone] attn: Sonia Baller  Advised would fill out and return to her on Monday.  Pt thanked nurse for return call.

## 2019-08-23 ENCOUNTER — Telehealth: Payer: Self-pay

## 2019-08-23 NOTE — Telephone Encounter (Signed)
Call returned to Pt.  Corrected fax #  Refaxed form to Pt.  No further action needed.

## 2019-09-09 DIAGNOSIS — Z23 Encounter for immunization: Secondary | ICD-10-CM | POA: Diagnosis not present

## 2019-09-12 ENCOUNTER — Ambulatory Visit (INDEPENDENT_AMBULATORY_CARE_PROVIDER_SITE_OTHER): Payer: 59 | Admitting: *Deleted

## 2019-09-12 DIAGNOSIS — I4901 Ventricular fibrillation: Secondary | ICD-10-CM | POA: Diagnosis not present

## 2019-09-12 LAB — CUP PACEART REMOTE DEVICE CHECK
Battery Remaining Longevity: 86 mo
Battery Voltage: 2.99 V
Brady Statistic RV Percent Paced: 0.01 %
Date Time Interrogation Session: 20200916052402
HighPow Impedance: 45 Ohm
HighPow Impedance: 59 Ohm
Implantable Lead Implant Date: 20000821
Implantable Lead Location: 753860
Implantable Lead Model: 6942
Implantable Pulse Generator Implant Date: 20151019
Lead Channel Impedance Value: 399 Ohm
Lead Channel Impedance Value: 418 Ohm
Lead Channel Pacing Threshold Amplitude: 0.5 V
Lead Channel Pacing Threshold Pulse Width: 0.4 ms
Lead Channel Sensing Intrinsic Amplitude: 3.25 mV
Lead Channel Sensing Intrinsic Amplitude: 3.25 mV
Lead Channel Setting Pacing Amplitude: 2 V
Lead Channel Setting Pacing Pulse Width: 0.4 ms
Lead Channel Setting Sensing Sensitivity: 0.3 mV

## 2019-09-18 ENCOUNTER — Encounter: Payer: Self-pay | Admitting: Cardiology

## 2019-09-18 NOTE — Progress Notes (Signed)
Remote ICD transmission.   

## 2019-10-28 IMAGING — RF DG HIP (WITH PELVIS) OPERATIVE*R*
1 series · 5 of 5 positions shown · non-contrast
Comparison: None.

CLINICAL DATA: Right hip replacement.

EXAM:
OPERATIVE right HIP (WITH PELVIS IF PERFORMED) 5 VIEWS
TECHNIQUE: Fluoroscopic spot image(s) were submitted for interpretation
post-operatively.
FLUOROSCOPY TIME:  34 seconds.

[Series 1: run · 5 of 5 slices shown]
[im 1/5]
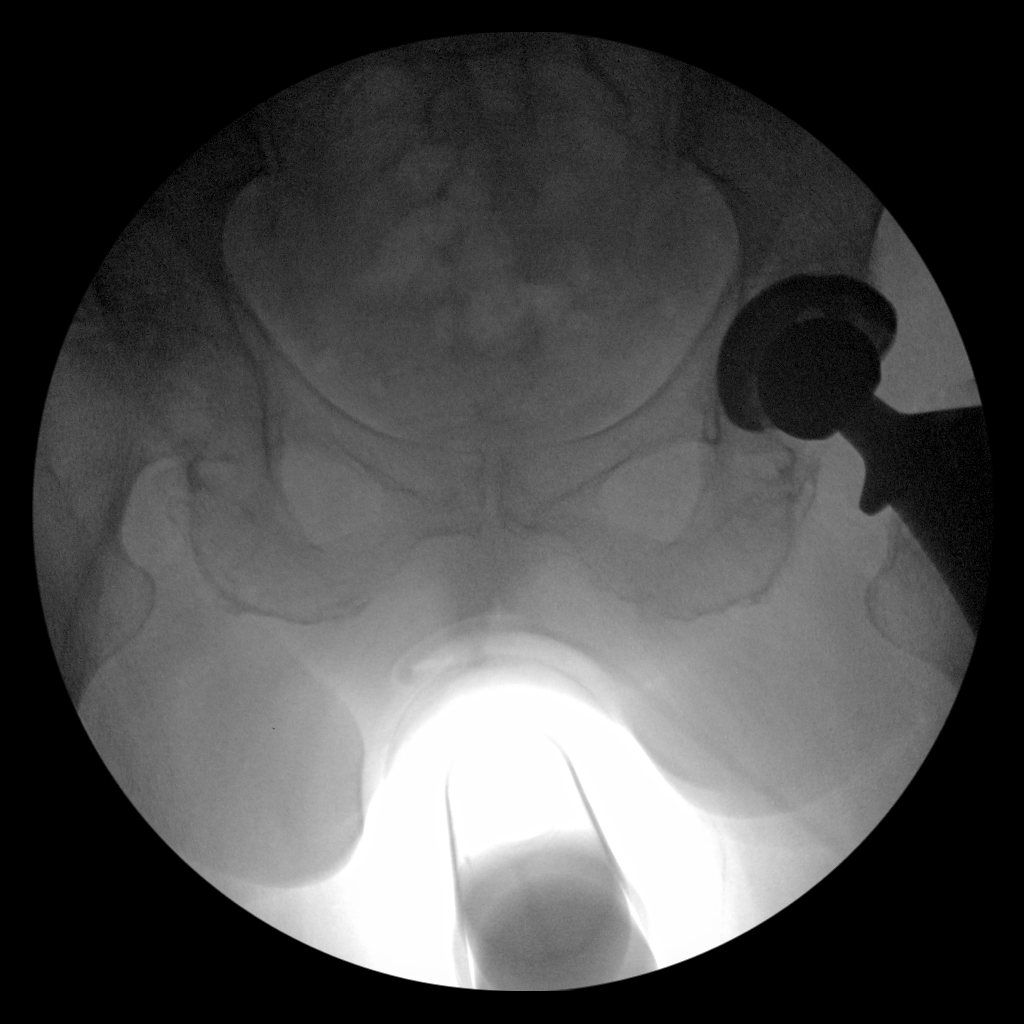
[im 2/5]
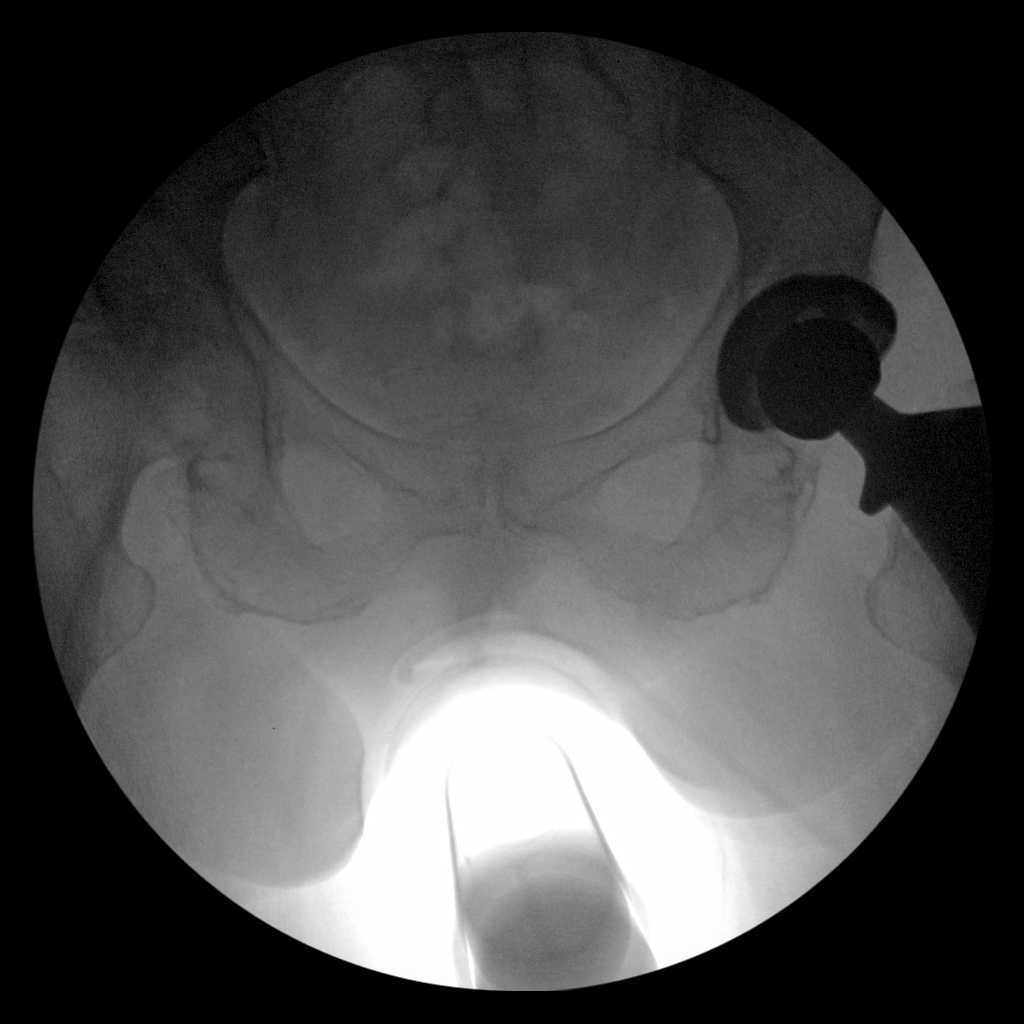
[im 3/5]
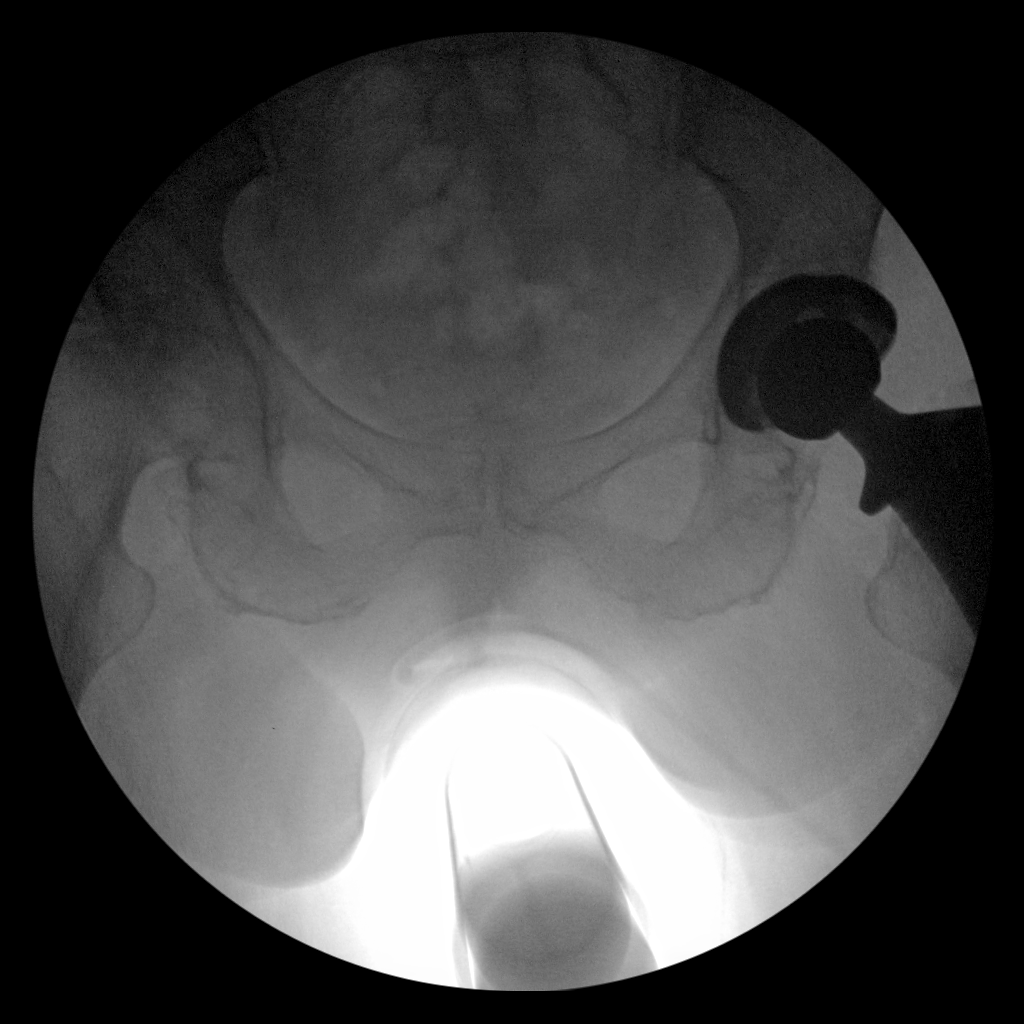
[im 4/5]
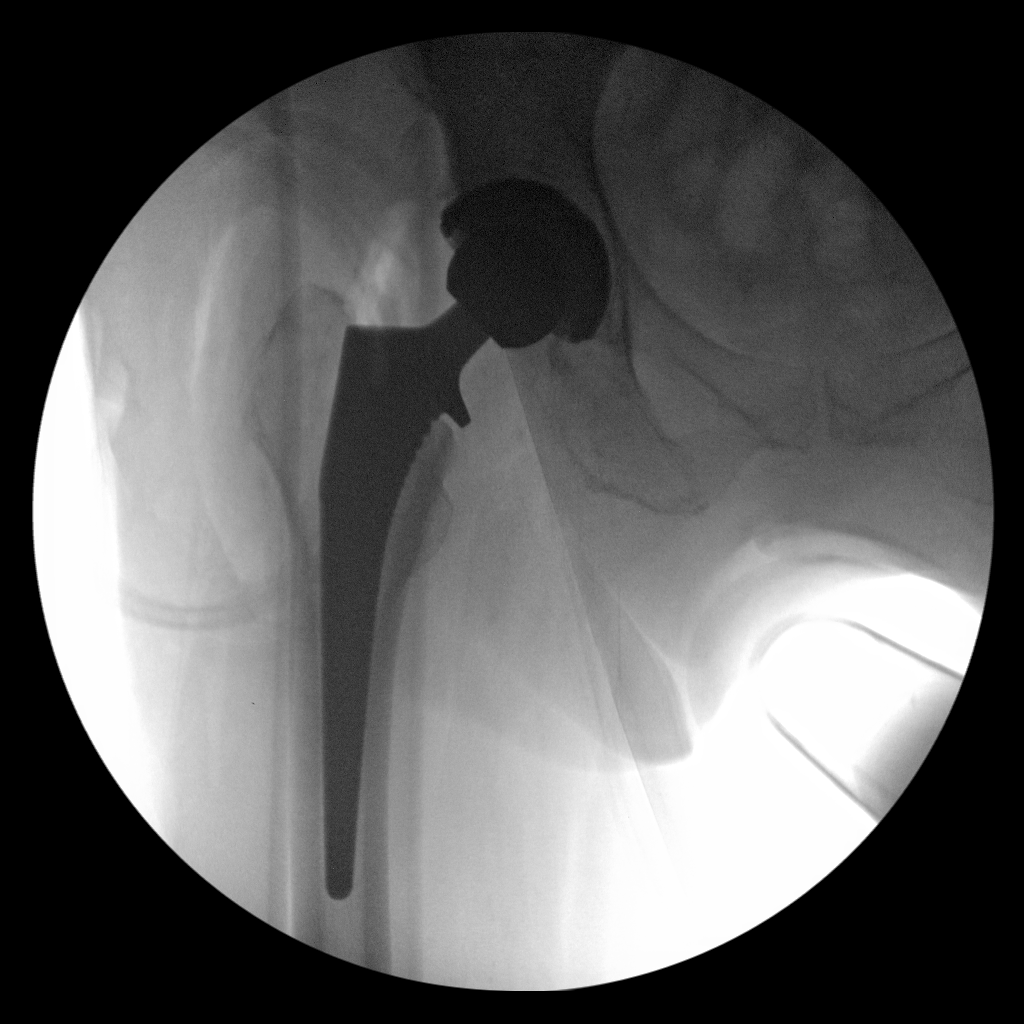
[im 5/5]
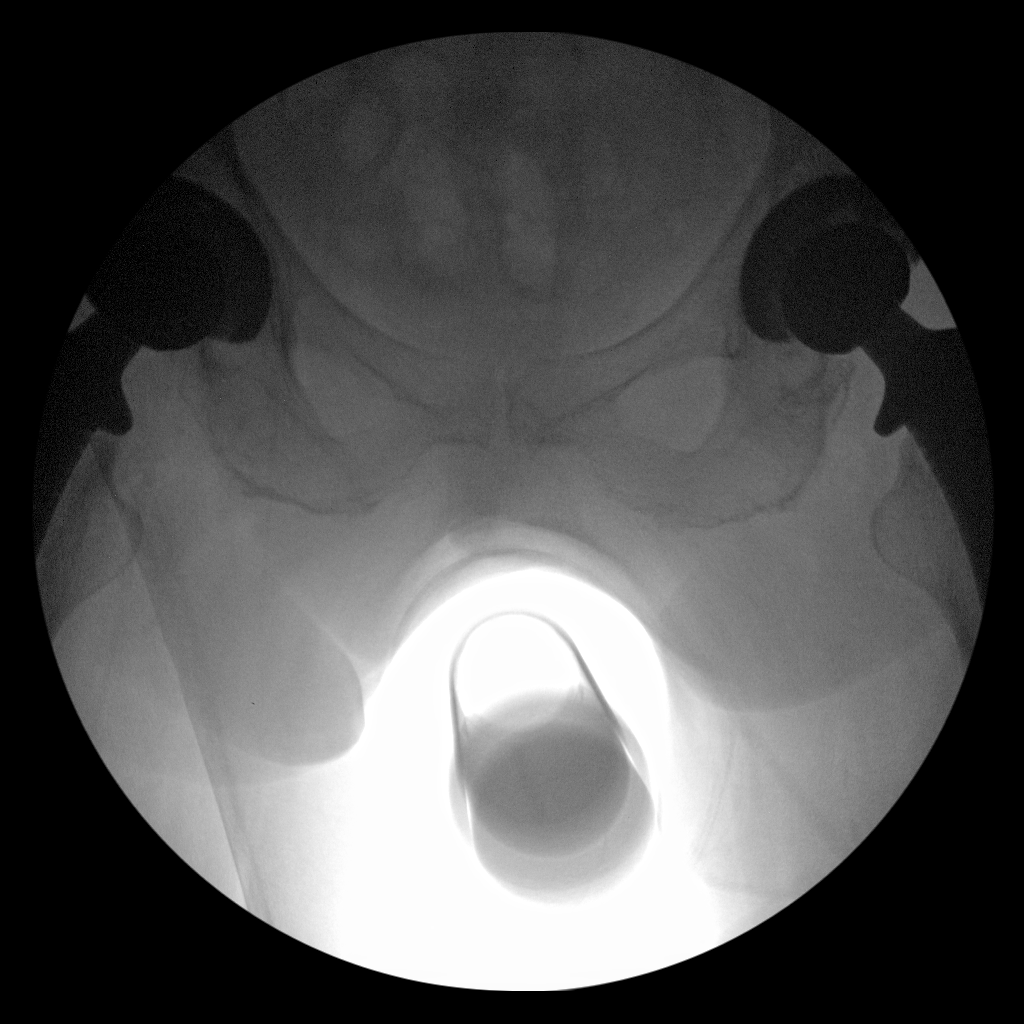

[5 of 5 positions shown; findings below may reference images not displayed]

FINDINGS: Five intraoperative fluoroscopic images of the right hip demonstrate
the femoral and acetabular components to be well situated. Expected
postoperative changes are noted in the surrounding soft tissues. No
fracture or dislocation is noted.
IMPRESSION: Status post right total hip arthroplasty.

## 2019-12-12 ENCOUNTER — Ambulatory Visit (INDEPENDENT_AMBULATORY_CARE_PROVIDER_SITE_OTHER): Payer: 59 | Admitting: *Deleted

## 2019-12-12 ENCOUNTER — Encounter: Payer: 59 | Admitting: Physician Assistant

## 2019-12-12 DIAGNOSIS — Z95810 Presence of automatic (implantable) cardiac defibrillator: Secondary | ICD-10-CM

## 2019-12-12 LAB — CUP PACEART REMOTE DEVICE CHECK
Battery Remaining Longevity: 79 mo
Battery Voltage: 2.99 V
Brady Statistic RV Percent Paced: 0.01 %
Date Time Interrogation Session: 20201216033524
HighPow Impedance: 41 Ohm
HighPow Impedance: 51 Ohm
Implantable Lead Implant Date: 20000821
Implantable Lead Location: 753860
Implantable Lead Model: 6942
Implantable Pulse Generator Implant Date: 20151019
Lead Channel Impedance Value: 361 Ohm
Lead Channel Impedance Value: 361 Ohm
Lead Channel Pacing Threshold Amplitude: 0.5 V
Lead Channel Pacing Threshold Pulse Width: 0.4 ms
Lead Channel Sensing Intrinsic Amplitude: 2.125 mV
Lead Channel Sensing Intrinsic Amplitude: 2.125 mV
Lead Channel Setting Pacing Amplitude: 2 V
Lead Channel Setting Pacing Pulse Width: 0.4 ms
Lead Channel Setting Sensing Sensitivity: 0.3 mV

## 2020-01-02 NOTE — Progress Notes (Signed)
ICD remote 

## 2020-01-15 ENCOUNTER — Ambulatory Visit: Payer: 59 | Attending: Internal Medicine

## 2020-01-15 DIAGNOSIS — Z23 Encounter for immunization: Secondary | ICD-10-CM | POA: Insufficient documentation

## 2020-01-15 NOTE — Progress Notes (Signed)
   Covid-19 Vaccination Clinic  Name:  April [name]    MRN: [id number] DOB: 02-07-1941  01/15/2020  [name] was observed post Covid-19 immunization for 15 minutes without incidence. She was provided with Vaccine Information Sheet and instruction to access the V-Safe system.   [name] was instructed to call 911 with any severe reactions post vaccine: Marland Kitchen Difficulty breathing  . Swelling of your face and throat  . A fast heartbeat  . A bad rash all over your body  . Dizziness and weakness    Immunizations Administered    Name Date Dose VIS Date Route   Pfizer COVID-19 Vaccine 01/15/2020 10:15 AM 0.3 mL 12/07/2019 Intramuscular   Manufacturer: [employer], [employer]   Lot: [id number]   NDC: [address]-3

## 2020-01-21 ENCOUNTER — Encounter: Payer: 59 | Admitting: Physician Assistant

## 2020-02-04 ENCOUNTER — Ambulatory Visit: Payer: 59 | Attending: Internal Medicine

## 2020-02-04 DIAGNOSIS — Z23 Encounter for immunization: Secondary | ICD-10-CM | POA: Insufficient documentation

## 2020-02-04 NOTE — Progress Notes (Signed)
   Covid-19 Vaccination Clinic  Name:  April [name]    MRN: [id number] DOB: 09-23-1941  02/04/2020  [name] was observed post Covid-19 immunization for 15 minutes without incidence. She was provided with Vaccine Information Sheet and instruction to access the V-Safe system.   [name] was instructed to call 911 with any severe reactions post vaccine: Marland Kitchen Difficulty breathing  . Swelling of your face and throat  . A fast heartbeat  . A bad rash all over your body  . Dizziness and weakness    Immunizations Administered    Name Date Dose VIS Date Route   Pfizer COVID-19 Vaccine 02/04/2020  9:22 AM 0.3 mL 12/07/2019 Intramuscular   Manufacturer: [employer], [employer]   Lot: [id number]   NDC: [address]-3

## 2020-02-19 NOTE — Progress Notes (Signed)
Cardiology Office Note Date:  02/20/2020  Patient ID:  April [name] 25-May-1941, MRN [id number] PCP:  Patient, No Pcp Per  Cardiologist:  Dr. Lovena Le   Chief Complaint:  over due 6 mo visit  History of Present Illness: April [name] is a 79 y.o. female with history of HTN, VF arrest (2000).  She comes in today to be seen for Dr. Lovena Le.  Last seen by him Jun 2020 via tele-health, at that time doing well, no changes were made  She is doing very well.  She continues to work, keeps active and busy.  No exertional intolerances.  Denies any CP, palpitations or SOB, no dizzy spells, near syncope or syncope.  No shocks.  She gets some labs done through work/wellness, none otherwise   Device information MDT single chamber ICD implanted 2000 Secondary prevention, gen change 2007, 2015   Past Medical History:  Diagnosis Date  . AICD (automatic cardioverter/defibrillator) present   . Arthritis   . HTN (hypertension)   . Myocardial infarction ([place])    mild Mi 2000  . PONV (postoperative nausea and vomiting)   . Ventricular fibrillation [facility])     Past Surgical History:  Procedure Laterality Date  . IMPLANTABLE CARDIOVERTER DEFIBRILLATOR (ICD) GENERATOR CHANGE N/A 10/14/2014   Procedure: ICD GENERATOR CHANGE;  Surgeon: Evans Lance, MD;  Location: [facility] CATH LAB;  Service: Cardiovascular;  Laterality: N/A;  . IMPLANTABLE CARDIOVERTER DEFIBRILLATOR IMPLANT  2000; 10-14-2014   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; generator change 09-2014 by Dr Lovena Le  . TOTAL HIP ARTHROPLASTY Left 01/28/2017   Procedure: LEFT TOTAL HIP ARTHROPLASTY ANTERIOR APPROACH;  Surgeon: Mcarthur Rossetti, MD;  Location: [facility] ORS;  Service: Orthopedics;  Laterality: Left;  . TOTAL HIP ARTHROPLASTY Right 11/11/2017   Procedure: RIGHT TOTAL HIP ARTHROPLASTY ANTERIOR APPROACH;  Surgeon: Mcarthur Rossetti, MD;  Location: [facility] ORS;  Service: Orthopedics;  Laterality: Right;    Current Outpatient  Medications  Medication Sig Dispense Refill  . acetaminophen (TYLENOL) 500 MG tablet Take 500-1,000 mg every 6 (six) hours as needed by mouth for moderate pain or headache.     Marland Kitchen aspirin EC 81 MG tablet Take 81 mg by mouth daily.    . carvedilol (COREG) 25 MG tablet TAKE 2 TABLETS BY MOUTH 2  TIMES DAILY WITH A MEAL. 360 tablet 3  . diphenhydrAMINE (BENADRYL) 25 MG tablet Take 50 mg by mouth at bedtime as needed for allergies.    Marland Kitchen enalapril (VASOTEC) 10 MG tablet Take 1 tablet (10 mg total) by mouth 2 (two) times daily. 180 tablet 3  . gabapentin (NEURONTIN) 100 MG capsule Take 1 capsule (100 mg total) by mouth 2 (two) times daily. 30 capsule 1  . hydrochlorothiazide (HYDRODIURIL) 25 MG tablet Take 1 tablet by mouth  daily 90 tablet 3  . Omega-3 Fatty Acids (FISH OIL) 1000 MG CAPS Take 2 capsules (2,000 mg total) by mouth daily. 180 capsule 3  . potassium chloride SA (K-DUR) 20 MEQ tablet Take 1 tablet (20 mEq total) by mouth daily. 90 tablet 3  . rosuvastatin (CRESTOR) 10 MG tablet Take 1 tablet (10 mg total) by mouth daily. 90 tablet 3   No current facility-administered medications for this visit.    Allergies:   Codeine and Penicillins   Social History:  The patient  reports that she quit smoking about 20 years ago. She has never used smokeless tobacco. She reports that she does not drink alcohol or use drugs.  Family History:  The patient's family history includes Diabetes in her maternal aunt and maternal grandmother; Heart Problems in her maternal grandmother; Heart attack in her father and mother.  ROS:  Please see the history of present illness.  All other systems are reviewed and otherwise negative.   PHYSICAL EXAM:  VS:  BP (!) 182/80   Pulse 62   Ht 5\' 4"  (1.626 m)   Wt 147 lb (66.7 kg)   BMI 25.23 kg/m  BMI: Body mass index is 25.23 kg/m. Well nourished, well developed, in no acute distress  HEENT: normocephalic, atraumatic  Neck: no JVD, carotid bruits or  masses Cardiac:  RRR; 1/6 SM, no rubs, or gallops Lungs:  CTA b/l, no wheezing, rhonchi or rales  Abd: soft, nontender MS: no deformity or atrophy Ext:  no edema  Skin: warm and dry, no rash Neuro:  No gross deficits appreciated Psych: euthymic mood, full affect  ICD site is stable, no tethering or discomfort   EKG:  Done today and reviewed by myself shows  SR 62bpm, RBBB (new)  ICD interrogation done today and reviewed by myself:  Battery and lead measurements are good No arrhythmias or therapies 100% VS  Recent Labs: No results found for requested labs within last 8760 hours.  No results found for requested labs within last 8760 hours.   CrCl cannot be calculated (Patient's most recent lab result is older than the maximum 21 days allowed.).   Wt Readings from Last 3 Encounters:  02/20/20 147 lb (66.7 kg)  06/06/18 144 lb (65.3 kg)  01/04/18 139 lb (63 kg)     Other studies reviewed: Additional studies/records reviewed today include: summarized above  ASSESSMENT AND PLAN:  1. Resuscitated VF in 2000     No syncope  2. ICD     Intact function, no programming changes made  3. HTN     She mentions having terrible white coat syndrome and her BP typically much better     A recheck in 156/80     She is asked to continue to monitor at home  4. New RBBB  5. Soft SM on exam     Disposition: get an echo, BMET, LFTs and lipids.  Remotes Q 3 mo, and in clinic in 1 year, sooner if needed  Current medicines are reviewed at length with the patient today.  The patient did not have any concerns regarding medicines.   2001, PA-C 02/20/2020 4:33 PM     [facility] [address] [place] [phone] (office)  [phone] (fax)

## 2020-02-20 ENCOUNTER — Ambulatory Visit (INDEPENDENT_AMBULATORY_CARE_PROVIDER_SITE_OTHER): Payer: 59 | Admitting: Physician Assistant

## 2020-02-20 ENCOUNTER — Other Ambulatory Visit: Payer: Self-pay

## 2020-02-20 VITALS — BP 182/80 | HR 62 | Ht 64.0 in | Wt 147.0 lb

## 2020-02-20 DIAGNOSIS — I4901 Ventricular fibrillation: Secondary | ICD-10-CM

## 2020-02-20 DIAGNOSIS — R011 Cardiac murmur, unspecified: Secondary | ICD-10-CM | POA: Diagnosis not present

## 2020-02-20 DIAGNOSIS — I10 Essential (primary) hypertension: Secondary | ICD-10-CM | POA: Diagnosis not present

## 2020-02-20 DIAGNOSIS — Z95810 Presence of automatic (implantable) cardiac defibrillator: Secondary | ICD-10-CM

## 2020-02-20 DIAGNOSIS — I4510 Unspecified right bundle-branch block: Secondary | ICD-10-CM

## 2020-02-20 NOTE — Patient Instructions (Addendum)
Medication Instructions:    STOP TAKING ASPIRIN 325 MG ONCE A DAY    START TAKING ASPIRIN 81 MG ONCE A DAY   *If you need a refill on your cardiac medications before your next appointment, please call your pharmacy*  Lab Work: RETURN ON SAME DAY AS ECHO FOR  BMET LFT AND LIPIDS  LABS   If you have labs (blood work) drawn today and your tests are completely normal, you will receive your results only by: Marland Kitchen MyChart Message (if you have MyChart) OR . A paper copy in the mail If you have any lab test that is abnormal or we need to change your treatment, we will call you to review the results.  Testing/Procedures: Your physician has requested that you have an echocardiogram. Echocardiography is a painless test that uses sound waves to create images of your heart. It provides your doctor with information about the size and shape of your heart and how well your heart's chambers and valves are working. This procedure takes approximately one hour. There are no restrictions for this procedure.   Follow-Up: At [facility], you and your health needs are our priority.  As part of our continuing mission to provide you with exceptional heart care, we have created designated Provider Care Teams.  These Care Teams include your primary Cardiologist (physician) and Advanced Practice Providers (APPs -  Physician Assistants and Nurse Practitioners) who all work together to provide you with the care you need, when you need it.  Your next appointment:   1 year(s)  The format for your next appointment:   In Person  Provider:   You may see Lewayne Bunting, MD or one of the following Advanced Practice Providers on your designated Care Team:    Gypsy Balsam, NP  Francis Dowse, PA-C  Casimiro Needle "Otilio Saber, [place]   Other Instructions

## 2020-03-05 NOTE — Addendum Note (Signed)
Addended by: Oleta Mouse on: 03/05/2020 01:39 PM   Modules accepted: Orders

## 2020-03-12 ENCOUNTER — Ambulatory Visit (INDEPENDENT_AMBULATORY_CARE_PROVIDER_SITE_OTHER): Payer: 59 | Admitting: *Deleted

## 2020-03-12 DIAGNOSIS — Z95810 Presence of automatic (implantable) cardiac defibrillator: Secondary | ICD-10-CM | POA: Diagnosis not present

## 2020-03-12 LAB — CUP PACEART REMOTE DEVICE CHECK
Battery Remaining Longevity: 74 mo
Battery Voltage: 2.99 V
Brady Statistic RV Percent Paced: 0.03 %
Date Time Interrogation Session: 20210317001703
HighPow Impedance: 43 Ohm
HighPow Impedance: 54 Ohm
Implantable Lead Implant Date: 20000821
Implantable Lead Location: 753860
Implantable Lead Model: 6942
Implantable Pulse Generator Implant Date: 20151019
Lead Channel Impedance Value: 361 Ohm
Lead Channel Impedance Value: 361 Ohm
Lead Channel Pacing Threshold Amplitude: 0.5 V
Lead Channel Pacing Threshold Pulse Width: 0.4 ms
Lead Channel Sensing Intrinsic Amplitude: 2.25 mV
Lead Channel Sensing Intrinsic Amplitude: 2.25 mV
Lead Channel Setting Pacing Amplitude: 2 V
Lead Channel Setting Pacing Pulse Width: 0.4 ms
Lead Channel Setting Sensing Sensitivity: 0.3 mV

## 2020-03-12 NOTE — Progress Notes (Signed)
ICD Remote  

## 2020-05-14 ENCOUNTER — Telehealth (HOSPITAL_COMMUNITY): Payer: Self-pay | Admitting: Physician Assistant

## 2020-05-14 NOTE — Telephone Encounter (Signed)
Patient cancelled echocardiogram and does not wish to reschedule at this time. We will remove from the WQ and if patient calls back to schedule we can reinstate order.

## 2020-06-04 ENCOUNTER — Other Ambulatory Visit: Payer: Self-pay

## 2020-06-04 ENCOUNTER — Other Ambulatory Visit: Payer: 59

## 2020-06-04 ENCOUNTER — Other Ambulatory Visit (HOSPITAL_COMMUNITY): Payer: 59

## 2020-06-04 DIAGNOSIS — I4901 Ventricular fibrillation: Secondary | ICD-10-CM

## 2020-06-04 DIAGNOSIS — R011 Cardiac murmur, unspecified: Secondary | ICD-10-CM

## 2020-06-04 LAB — HEPATIC FUNCTION PANEL
ALT: 15 IU/L (ref 0–32)
AST: 18 IU/L (ref 0–40)
Albumin: 4.8 g/dL — ABNORMAL HIGH (ref 3.7–4.7)
Alkaline Phosphatase: 94 IU/L (ref 48–121)
Bilirubin Total: 0.4 mg/dL (ref 0.0–1.2)
Bilirubin, Direct: 0.13 mg/dL (ref 0.00–0.40)
Total Protein: 6.8 g/dL (ref 6.0–8.5)

## 2020-06-04 LAB — BASIC METABOLIC PANEL
BUN/Creatinine Ratio: 20 (ref 12–28)
BUN: 18 mg/dL (ref 8–27)
CO2: 25 mmol/L (ref 20–29)
Calcium: 9.2 mg/dL (ref 8.7–10.3)
Chloride: 101 mmol/L (ref 96–106)
Creatinine, Ser: 0.9 mg/dL (ref 0.57–1.00)
GFR calc Af Amer: 71 mL/min/{1.73_m2} (ref >59)
GFR calc non Af Amer: 61 mL/min/{1.73_m2} (ref >59)
Glucose: 110 mg/dL — ABNORMAL HIGH (ref 65–99)
Potassium: 4.9 mmol/L (ref 3.5–5.2)
Sodium: 138 mmol/L (ref 134–144)

## 2020-06-04 LAB — LIPID PANEL
Chol/HDL Ratio: 2.9 ratio (ref 0.0–4.4)
Cholesterol, Total: 101 mg/dL (ref 100–199)
HDL: 35 mg/dL — ABNORMAL LOW (ref >39)
LDL Chol Calc (NIH): 40 mg/dL (ref 0–99)
Triglycerides: 152 mg/dL — ABNORMAL HIGH (ref 0–149)
VLDL Cholesterol Cal: 26 mg/dL (ref 5–40)

## 2020-06-11 ENCOUNTER — Ambulatory Visit (INDEPENDENT_AMBULATORY_CARE_PROVIDER_SITE_OTHER): Payer: 59 | Admitting: *Deleted

## 2020-06-11 DIAGNOSIS — I4901 Ventricular fibrillation: Secondary | ICD-10-CM

## 2020-06-14 LAB — CUP PACEART REMOTE DEVICE CHECK
Battery Remaining Longevity: 71 mo
Battery Voltage: 2.96 V
Brady Statistic RV Percent Paced: 0.03 %
Date Time Interrogation Session: 20210618134526
HighPow Impedance: 43 Ohm
HighPow Impedance: 54 Ohm
Implantable Lead Implant Date: 20000821
Implantable Lead Location: 753860
Implantable Lead Model: 6942
Implantable Pulse Generator Implant Date: 20151019
Lead Channel Impedance Value: 399 Ohm
Lead Channel Impedance Value: 399 Ohm
Lead Channel Pacing Threshold Amplitude: 0.5 V
Lead Channel Pacing Threshold Pulse Width: 0.4 ms
Lead Channel Sensing Intrinsic Amplitude: 2 mV
Lead Channel Sensing Intrinsic Amplitude: 2 mV
Lead Channel Setting Pacing Amplitude: 2 V
Lead Channel Setting Pacing Pulse Width: 0.4 ms
Lead Channel Setting Sensing Sensitivity: 0.3 mV

## 2020-06-16 NOTE — Progress Notes (Signed)
Remote ICD transmission.   

## 2020-06-18 ENCOUNTER — Other Ambulatory Visit: Payer: Self-pay | Admitting: Internal Medicine

## 2020-06-18 DIAGNOSIS — Z95810 Presence of automatic (implantable) cardiac defibrillator: Secondary | ICD-10-CM

## 2020-06-19 MED ORDER — HYDROCHLOROTHIAZIDE 25 MG PO TABS: ORAL_TABLET | ORAL | 2 refills | Status: DC

## 2020-06-19 MED ORDER — POTASSIUM CHLORIDE CRYS ER 20 MEQ PO TBCR: 20.0000 meq | EXTENDED_RELEASE_TABLET | Freq: Every day | ORAL | 2 refills | Status: DC

## 2020-06-19 MED ORDER — ENALAPRIL MALEATE 10 MG PO TABS: 10.0000 mg | ORAL_TABLET | Freq: Two times a day (BID) | ORAL | 2 refills | Status: DC

## 2020-06-19 MED ORDER — CARVEDILOL 25 MG PO TABS: ORAL_TABLET | ORAL | 2 refills | Status: DC

## 2020-06-19 MED ORDER — ROSUVASTATIN CALCIUM 10 MG PO TABS: 10.0000 mg | ORAL_TABLET | Freq: Every day | ORAL | 2 refills | Status: DC

## 2020-06-20 ENCOUNTER — Telehealth: Payer: Self-pay

## 2020-06-20 NOTE — Telephone Encounter (Signed)
Returned call to [employer].  Advised they could substitute whatever equivalency was available.

## 2020-06-20 NOTE — Telephone Encounter (Signed)
[facility] mail order pharmacy stating that pt's medication Klor-Con 20 meq is not available and would like to know if Dr. Ladona Ridgel would like to prescribe an alternative, Potassium CI crystals?particles form (generic for Klor con M) (PATIENT HISTORY) or Potassium CL wax Matrix form (generic for K-tab). Ph# [phone]. Order# [id number]. Please address

## 2020-08-11 ENCOUNTER — Telehealth: Payer: Self-pay | Admitting: Internal Medicine

## 2020-08-11 NOTE — Telephone Encounter (Signed)
Patient called and wanted to confirm a fax # for Dr. Lubertha Basque Nurse Boneta Lucks. The patient will be sending Boneta Lucks some forms for Dr. Ladona Ridgel to fill out for her health insurance.

## 2020-08-12 NOTE — Telephone Encounter (Signed)
Form completed and faxed back to Pt as requested.

## 2020-09-10 ENCOUNTER — Ambulatory Visit (INDEPENDENT_AMBULATORY_CARE_PROVIDER_SITE_OTHER): Payer: 59 | Admitting: *Deleted

## 2020-09-10 DIAGNOSIS — I429 Cardiomyopathy, unspecified: Secondary | ICD-10-CM

## 2020-09-10 DIAGNOSIS — I4901 Ventricular fibrillation: Secondary | ICD-10-CM | POA: Diagnosis not present

## 2020-09-12 LAB — CUP PACEART REMOTE DEVICE CHECK
Battery Remaining Longevity: 63 mo
Battery Voltage: 2.98 V
Brady Statistic RV Percent Paced: 0.03 %
Date Time Interrogation Session: 20210917093202
HighPow Impedance: 45 Ohm
HighPow Impedance: 55 Ohm
Implantable Lead Implant Date: 20000821
Implantable Lead Location: 753860
Implantable Lead Model: 6942
Implantable Pulse Generator Implant Date: 20151019
Lead Channel Impedance Value: 399 Ohm
Lead Channel Impedance Value: 418 Ohm
Lead Channel Pacing Threshold Amplitude: 0.5 V
Lead Channel Pacing Threshold Pulse Width: 0.4 ms
Lead Channel Sensing Intrinsic Amplitude: 2 mV
Lead Channel Sensing Intrinsic Amplitude: 2 mV
Lead Channel Setting Pacing Amplitude: 2 V
Lead Channel Setting Pacing Pulse Width: 0.4 ms
Lead Channel Setting Sensing Sensitivity: 0.3 mV

## 2020-09-15 NOTE — Progress Notes (Signed)
Remote ICD transmission.   

## 2020-10-01 ENCOUNTER — Telehealth: Payer: Self-pay | Admitting: Internal Medicine

## 2020-10-01 NOTE — Telephone Encounter (Signed)
Returned call to pt.  Pt has a billing issue, the way her visit was "coded" she has a bill of $120.  She would like the visit to be recoded.  Advised Pt to call billing department.  Phone number given.

## 2020-10-01 NOTE — Telephone Encounter (Signed)
    Pt would like to speak with RN Boneta Lucks. She said she just have some questions to ask her

## 2020-12-09 ENCOUNTER — Ambulatory Visit (INDEPENDENT_AMBULATORY_CARE_PROVIDER_SITE_OTHER): Payer: 59

## 2020-12-09 DIAGNOSIS — I4901 Ventricular fibrillation: Secondary | ICD-10-CM | POA: Diagnosis not present

## 2020-12-09 LAB — CUP PACEART REMOTE DEVICE CHECK
Battery Remaining Longevity: 57 mo
Battery Voltage: 2.98 V
Brady Statistic RV Percent Paced: 0.01 %
Date Time Interrogation Session: 20211214012205
HighPow Impedance: 46 Ohm
HighPow Impedance: 59 Ohm
Implantable Lead Implant Date: 20000821
Implantable Lead Location: 753860
Implantable Lead Model: 6942
Implantable Pulse Generator Implant Date: 20151019
Lead Channel Impedance Value: 399 Ohm
Lead Channel Impedance Value: 399 Ohm
Lead Channel Pacing Threshold Amplitude: 0.5 V
Lead Channel Pacing Threshold Pulse Width: 0.4 ms
Lead Channel Sensing Intrinsic Amplitude: 2.5 mV
Lead Channel Sensing Intrinsic Amplitude: 2.5 mV
Lead Channel Setting Pacing Amplitude: 2 V
Lead Channel Setting Pacing Pulse Width: 0.4 ms
Lead Channel Setting Sensing Sensitivity: 0.3 mV

## 2020-12-24 NOTE — Progress Notes (Signed)
Remote ICD transmission.   

## 2021-03-10 ENCOUNTER — Ambulatory Visit (INDEPENDENT_AMBULATORY_CARE_PROVIDER_SITE_OTHER): Payer: 59

## 2021-03-10 DIAGNOSIS — I429 Cardiomyopathy, unspecified: Secondary | ICD-10-CM

## 2021-03-10 DIAGNOSIS — I428 Other cardiomyopathies: Secondary | ICD-10-CM

## 2021-03-10 LAB — CUP PACEART REMOTE DEVICE CHECK
Battery Remaining Longevity: 53 mo
Battery Voltage: 2.98 V
Brady Statistic RV Percent Paced: 0.02 %
Date Time Interrogation Session: 20220315043724
HighPow Impedance: 43 Ohm
HighPow Impedance: 53 Ohm
Implantable Lead Implant Date: 20000821
Implantable Lead Location: 753860
Implantable Lead Model: 6942
Implantable Pulse Generator Implant Date: 20151019
Lead Channel Impedance Value: 361 Ohm
Lead Channel Impedance Value: 361 Ohm
Lead Channel Pacing Threshold Amplitude: 0.5 V
Lead Channel Pacing Threshold Pulse Width: 0.4 ms
Lead Channel Sensing Intrinsic Amplitude: 2.75 mV
Lead Channel Sensing Intrinsic Amplitude: 2.75 mV
Lead Channel Setting Pacing Amplitude: 2 V
Lead Channel Setting Pacing Pulse Width: 0.4 ms
Lead Channel Setting Sensing Sensitivity: 0.3 mV

## 2021-03-18 NOTE — Progress Notes (Signed)
Remote ICD transmission.   

## 2021-04-07 ENCOUNTER — Other Ambulatory Visit: Payer: Self-pay | Admitting: Internal Medicine

## 2021-04-07 DIAGNOSIS — Z95810 Presence of automatic (implantable) cardiac defibrillator: Secondary | ICD-10-CM

## 2021-04-22 ENCOUNTER — Telehealth: Payer: Self-pay | Admitting: Internal Medicine

## 2021-04-22 MED ORDER — POTASSIUM CHLORIDE CRYS ER 20 MEQ PO TBCR: 20.0000 meq | EXTENDED_RELEASE_TABLET | Freq: Every day | ORAL | 0 refills | Status: DC

## 2021-04-22 NOTE — Telephone Encounter (Signed)
*  STAT* If patient is at the pharmacy, call can be transferred to refill team.   1. Which medications need to be refilled? (please list name of each medication and dose if known) potassium chloride SA (KLOR-CON) 20 MEQ tablet  2. Which pharmacy/location (including street and city if local pharmacy) is medication to be sent to? [facility] [place], [place] - [address]  3. Do they need a 30 day or 90 day supply? 30 day    [facility] does not have any potassium and is unsure when they will get any in. Pt is completely out of medication.

## 2021-04-22 NOTE — Telephone Encounter (Signed)
Pt's medication was sent to pt's pharmacy as requested. Confirmation received.  °

## 2021-06-09 ENCOUNTER — Ambulatory Visit (INDEPENDENT_AMBULATORY_CARE_PROVIDER_SITE_OTHER): Payer: 59

## 2021-06-09 DIAGNOSIS — I4901 Ventricular fibrillation: Secondary | ICD-10-CM

## 2021-06-09 LAB — CUP PACEART REMOTE DEVICE CHECK
Battery Remaining Longevity: 47 mo
Battery Voltage: 2.98 V
Brady Statistic RV Percent Paced: 0.02 %
Date Time Interrogation Session: 20220614022603
HighPow Impedance: 43 Ohm
HighPow Impedance: 55 Ohm
Implantable Lead Implant Date: 20000821
Implantable Lead Location: 753860
Implantable Lead Model: 6942
Implantable Pulse Generator Implant Date: 20151019
Lead Channel Impedance Value: 418 Ohm
Lead Channel Impedance Value: 418 Ohm
Lead Channel Pacing Threshold Amplitude: 0.625 V
Lead Channel Pacing Threshold Pulse Width: 0.4 ms
Lead Channel Sensing Intrinsic Amplitude: 2 mV
Lead Channel Sensing Intrinsic Amplitude: 2 mV
Lead Channel Setting Pacing Amplitude: 2 V
Lead Channel Setting Pacing Pulse Width: 0.4 ms
Lead Channel Setting Sensing Sensitivity: 0.3 mV

## 2021-07-01 NOTE — Progress Notes (Signed)
Remote ICD transmission.   

## 2021-07-14 ENCOUNTER — Other Ambulatory Visit: Payer: Self-pay

## 2021-07-14 ENCOUNTER — Ambulatory Visit (INDEPENDENT_AMBULATORY_CARE_PROVIDER_SITE_OTHER): Payer: 59 | Admitting: Internal Medicine

## 2021-07-14 ENCOUNTER — Encounter: Payer: Self-pay | Admitting: Internal Medicine

## 2021-07-14 VITALS — BP 202/90 | HR 69 | Ht 64.0 in | Wt 155.0 lb

## 2021-07-14 DIAGNOSIS — I4901 Ventricular fibrillation: Secondary | ICD-10-CM

## 2021-07-14 DIAGNOSIS — Z95810 Presence of automatic (implantable) cardiac defibrillator: Secondary | ICD-10-CM

## 2021-07-14 DIAGNOSIS — I10 Essential (primary) hypertension: Secondary | ICD-10-CM | POA: Diagnosis not present

## 2021-07-14 MED ORDER — ENALAPRIL MALEATE 10 MG PO TABS: 10.0000 mg | ORAL_TABLET | Freq: Two times a day (BID) | ORAL | 3 refills | Status: DC

## 2021-07-14 MED ORDER — CARVEDILOL 25 MG PO TABS: ORAL_TABLET | ORAL | 3 refills | Status: DC

## 2021-07-14 MED ORDER — POTASSIUM CHLORIDE CRYS ER 20 MEQ PO TBCR: 20.0000 meq | EXTENDED_RELEASE_TABLET | Freq: Every day | ORAL | 3 refills | Status: DC

## 2021-07-14 MED ORDER — HYDROCHLOROTHIAZIDE 25 MG PO TABS: 25.0000 mg | ORAL_TABLET | Freq: Every day | ORAL | 3 refills | Status: DC

## 2021-07-14 MED ORDER — ROSUVASTATIN CALCIUM 10 MG PO TABS: 10.0000 mg | ORAL_TABLET | Freq: Every day | ORAL | 3 refills | Status: DC

## 2021-07-14 NOTE — Patient Instructions (Signed)
Medication Instructions:  Your physician recommends that you continue on your current medications as directed. Please refer to the Current Medication list given to you today.  Labwork: None ordered.  Testing/Procedures: None ordered.  Follow-Up: Your physician wants you to follow-up in: one year with Lewayne Bunting, MD or one of the following Advanced Practice Providers on your designated Care Team:   Francis Dowse, [place] Casimiro Needle "Mardelle Matte" Lanna Poche, [place]  Remote monitoring is used to monitor your ICD from home. This monitoring reduces the number of office visits required to check your device to one time per year. It allows [place] to keep an eye on the functioning of your device to ensure it is working properly. You are scheduled for a device check from home on 09/08/2021. You may send your transmission at any time that day. If you have a wireless device, the transmission will be sent automatically. After your physician reviews your transmission, you will receive a postcard with your next transmission date.  Any Other Special Instructions Will Be Listed Below (If Applicable).  If you need a refill on your cardiac medications before your next appointment, please call your pharmacy.

## 2021-07-14 NOTE — Progress Notes (Signed)
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 She has done well in the interim. I have not seen her since the Covid pandemic began. She remains active and is still working. She denies chest pain or sob. She notes that at home her bp is controlled. "My mother's blood pressure always went up as when she went to see the doctor." She has not had any ICD therapies.  Allergies  Allergen Reactions   Codeine Nausea And Vomiting   Penicillins Hives, Rash and Other (See Comments)    As a child Has patient had a PCN reaction causing immediate rash, facial/tongue/throat swelling, SOB or lightheadedness with hypotension:unsure Has patient had a PCN reaction causing severe rash involving mucus membranes or skin necrosis:unsure Has patient had a PCN reaction that required hospitalization:No Has patient had a PCN reaction occurring within the last 10 years:No If all of the above answers are "NO", then may proceed with Cephalosporin use.      Current Outpatient Medications  Medication Sig Dispense Refill   acetaminophen (TYLENOL) 500 MG tablet Take 500-1,000 mg every 6 (six) hours as needed by mouth for moderate pain or headache.      aspirin EC 81 MG tablet Take 81 mg by mouth daily.     carvedilol (COREG) 25 MG tablet TAKE 2 TABLETS BY MOUTH  TWICE DAILY WITH MEALS 360 tablet 0   diphenhydrAMINE (BENADRYL) 25 MG tablet Take 50 mg by mouth at bedtime as needed for allergies.     enalapril (VASOTEC) 10 MG tablet TAKE 1 TABLET BY MOUTH  TWICE DAILY 180 tablet 0   gabapentin (NEURONTIN) 100 MG capsule Take 1 capsule (100 mg total) by mouth 2 (two) times daily. 30 capsule 1   hydrochlorothiazide (HYDRODIURIL) 25 MG tablet TAKE 1 TABLET BY MOUTH  DAILY 90 tablet 0   Omega-3 Fatty Acids (FISH OIL) 1000 MG CAPS Take 2 capsules (2,000 mg total) by mouth daily. 180 capsule 3   potassium chloride SA (KLOR-CON)  20 MEQ tablet Take 1 tablet (20 mEq total) by mouth daily. Please keep upcoming appt with Dr. Ladona Ridgel in July 2022 before anymore refills. Thank you 90 tablet 0   rosuvastatin (CRESTOR) 10 MG tablet TAKE 1 TABLET BY MOUTH  DAILY 90 tablet 0   No current facility-administered medications for this visit.     Past Medical History:  Diagnosis Date   AICD (automatic cardioverter/defibrillator) present    Arthritis    HTN (hypertension)    Myocardial infarction (HCC)    mild Mi 2000   PONV (postoperative nausea and vomiting)    Ventricular fibrillation (HCC)     ROS:   All systems reviewed and negative except as noted in the HPI.   Past Surgical History:  Procedure Laterality Date   IMPLANTABLE CARDIOVERTER DEFIBRILLATOR (ICD) GENERATOR CHANGE N/A 10/14/2014   Procedure: ICD GENERATOR CHANGE;  Surgeon: Marinus Maw, MD;  Location: [facility] CATH LAB;  Service: Cardiovascular;  Laterality: N/A;   IMPLANTABLE CARDIOVERTER DEFIBRILLATOR IMPLANT  2000; 10-14-2014   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; generator change 09-2014 by Dr Ladona Ridgel   TOTAL HIP ARTHROPLASTY Left 01/28/2017   Procedure: LEFT TOTAL HIP ARTHROPLASTY ANTERIOR APPROACH;  Surgeon: Kathryne Hitch, MD;  Location: [facility];  Service: Orthopedics;  Laterality: Left;   TOTAL HIP ARTHROPLASTY Right 11/11/2017   Procedure: RIGHT TOTAL HIP  ARTHROPLASTY ANTERIOR APPROACH;  Surgeon: Kathryne Hitch, MD;  Location: [facility];  Service: Orthopedics;  Laterality: Right;     Family History  Problem Relation Age of Onset   Heart attack Mother    Heart attack Father        x2   Diabetes Maternal Aunt    Diabetes Maternal Grandmother    Heart Problems Maternal Grandmother      Social History   Socioeconomic History   Marital status: Widowed    Spouse name: Not on file   Number of children: Not on file   Years of education: Not on file   Highest education level: Not on file  Occupational History   Not  on file  Tobacco Use   Smoking status: Former    Types: Cigarettes    Quit date: 10/27/1999    Years since quitting: 21.7   Smokeless tobacco: Never  Vaping Use   Vaping Use: Never used  Substance and Sexual Activity   Alcohol use: No   Drug use: No   Sexual activity: Not on file  Other Topics Concern   Not on file  Social History Narrative   Not on file   Social Determinants of Health   Financial Resource Strain: Not on file  Food Insecurity: Not on file  Transportation Needs: Not on file  Physical Activity: Not on file  Stress: Not on file  Social Connections: Not on file  Intimate Partner Violence: Not on file     BP (!) 202/90   Pulse 69   Ht 5\' 4"  (1.626 m)   Wt 155 lb (70.3 kg)   SpO2 96%   BMI 26.61 kg/m   Physical Exam:  Well appearing NAD HEENT: Unremarkable Neck:  No JVD, no thyromegally Lymphatics:  No adenopathy Back:  No CVA tenderness Lungs:  Clear with no wheezes HEART:  Regular rate rhythm, no murmurs, no rubs, no clicks Abd:  soft, positive bowel sounds, no organomegally, no rebound, no guarding Ext:  2 plus pulses, no edema, no cyanosis, no clubbing Skin:  No rashes no nodules Neuro:  CN II through XII intact, motor grossly intact  EKG - NSR with RBBB  DEVICE  Normal device function.  See PaceArt for details.   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 She will undergo watchful waiting. HTN - her bp is better at home according to the patient. Since I have known her the bp has been high in the office. I suspect that she has had some non-compliance. I strongly encouraged her to avoid salty foods and to not miss her meds. CAD - she has a h/o MI in 2000 and she currently denies anginal symptoms.   4. ICD - her medtronic single chamber ICD is working normally.    2001 Rodger Giangregorio,MD

## 2021-09-08 ENCOUNTER — Ambulatory Visit (INDEPENDENT_AMBULATORY_CARE_PROVIDER_SITE_OTHER): Payer: 59

## 2021-09-08 DIAGNOSIS — I4901 Ventricular fibrillation: Secondary | ICD-10-CM | POA: Diagnosis not present

## 2021-09-08 LAB — CUP PACEART REMOTE DEVICE CHECK
Battery Remaining Longevity: 49 mo
Battery Voltage: 2.98 V
Brady Statistic RV Percent Paced: 0.03 %
Date Time Interrogation Session: 20220913033323
HighPow Impedance: 42 Ohm
HighPow Impedance: 51 Ohm
Implantable Lead Implant Date: 20000821
Implantable Lead Location: 753860
Implantable Lead Model: 6942
Implantable Pulse Generator Implant Date: 20151019
Lead Channel Impedance Value: 361 Ohm
Lead Channel Impedance Value: 361 Ohm
Lead Channel Pacing Threshold Amplitude: 0.5 V
Lead Channel Pacing Threshold Pulse Width: 0.4 ms
Lead Channel Sensing Intrinsic Amplitude: 2.125 mV
Lead Channel Sensing Intrinsic Amplitude: 2.125 mV
Lead Channel Setting Pacing Amplitude: 2 V
Lead Channel Setting Pacing Pulse Width: 0.4 ms
Lead Channel Setting Sensing Sensitivity: 0.3 mV

## 2021-09-16 NOTE — Progress Notes (Signed)
Remote ICD transmission.   

## 2021-12-08 ENCOUNTER — Ambulatory Visit (INDEPENDENT_AMBULATORY_CARE_PROVIDER_SITE_OTHER): Payer: 59

## 2021-12-08 DIAGNOSIS — I429 Cardiomyopathy, unspecified: Secondary | ICD-10-CM | POA: Diagnosis not present

## 2021-12-10 LAB — CUP PACEART REMOTE DEVICE CHECK
Battery Remaining Longevity: 47 mo
Battery Voltage: 2.96 V
Brady Statistic RV Percent Paced: 0.01 %
Date Time Interrogation Session: 20221214190044
HighPow Impedance: 43 Ohm
HighPow Impedance: 56 Ohm
Implantable Lead Implant Date: 20000821
Implantable Lead Location: 753860
Implantable Lead Model: 6942
Implantable Pulse Generator Implant Date: 20151019
Lead Channel Impedance Value: 399 Ohm
Lead Channel Impedance Value: 399 Ohm
Lead Channel Pacing Threshold Amplitude: 0.5 V
Lead Channel Pacing Threshold Pulse Width: 0.4 ms
Lead Channel Sensing Intrinsic Amplitude: 2.875 mV
Lead Channel Sensing Intrinsic Amplitude: 2.875 mV
Lead Channel Setting Pacing Amplitude: 2 V
Lead Channel Setting Pacing Pulse Width: 0.4 ms
Lead Channel Setting Sensing Sensitivity: 0.3 mV

## 2021-12-18 NOTE — Progress Notes (Signed)
Remote ICD transmission.   

## 2022-03-09 ENCOUNTER — Ambulatory Visit (INDEPENDENT_AMBULATORY_CARE_PROVIDER_SITE_OTHER): Payer: 59

## 2022-03-09 DIAGNOSIS — I429 Cardiomyopathy, unspecified: Secondary | ICD-10-CM

## 2022-03-11 LAB — CUP PACEART REMOTE DEVICE CHECK
Battery Remaining Longevity: 44 mo
Battery Voltage: 2.96 V
Brady Statistic RV Percent Paced: 0.01 %
Date Time Interrogation Session: 20230315185829
HighPow Impedance: 45 Ohm
HighPow Impedance: 56 Ohm
Implantable Lead Implant Date: 20000821
Implantable Lead Location: 753860
Implantable Lead Model: 6942
Implantable Pulse Generator Implant Date: 20151019
Lead Channel Impedance Value: 399 Ohm
Lead Channel Impedance Value: 418 Ohm
Lead Channel Pacing Threshold Amplitude: 0.5 V
Lead Channel Pacing Threshold Pulse Width: 0.4 ms
Lead Channel Sensing Intrinsic Amplitude: 2.75 mV
Lead Channel Sensing Intrinsic Amplitude: 2.75 mV
Lead Channel Setting Pacing Amplitude: 2 V
Lead Channel Setting Pacing Pulse Width: 0.4 ms
Lead Channel Setting Sensing Sensitivity: 0.3 mV

## 2022-03-23 NOTE — Progress Notes (Signed)
Remote ICD transmission.   

## 2022-05-13 ENCOUNTER — Other Ambulatory Visit: Payer: Self-pay | Admitting: Internal Medicine

## 2022-05-13 DIAGNOSIS — Z95810 Presence of automatic (implantable) cardiac defibrillator: Secondary | ICD-10-CM

## 2022-05-17 ENCOUNTER — Other Ambulatory Visit: Payer: Self-pay | Admitting: Internal Medicine

## 2022-05-17 DIAGNOSIS — Z95810 Presence of automatic (implantable) cardiac defibrillator: Secondary | ICD-10-CM

## 2022-06-08 ENCOUNTER — Ambulatory Visit (INDEPENDENT_AMBULATORY_CARE_PROVIDER_SITE_OTHER): Payer: 59

## 2022-06-08 DIAGNOSIS — I429 Cardiomyopathy, unspecified: Secondary | ICD-10-CM | POA: Diagnosis not present

## 2022-06-11 LAB — CUP PACEART REMOTE DEVICE CHECK
Battery Remaining Longevity: 42 mo
Battery Voltage: 2.96 V
Brady Statistic RV Percent Paced: 0.01 %
Date Time Interrogation Session: 20230614182301
HighPow Impedance: 44 Ohm
HighPow Impedance: 58 Ohm
Implantable Lead Implant Date: 20000821
Implantable Lead Location: 753860
Implantable Lead Model: 6942
Implantable Pulse Generator Implant Date: 20151019
Lead Channel Impedance Value: 399 Ohm
Lead Channel Impedance Value: 418 Ohm
Lead Channel Pacing Threshold Amplitude: 0.5 V
Lead Channel Pacing Threshold Pulse Width: 0.4 ms
Lead Channel Sensing Intrinsic Amplitude: 1.625 mV
Lead Channel Sensing Intrinsic Amplitude: 1.625 mV
Lead Channel Setting Pacing Amplitude: 2 V
Lead Channel Setting Pacing Pulse Width: 0.4 ms
Lead Channel Setting Sensing Sensitivity: 0.3 mV

## 2022-08-16 ENCOUNTER — Encounter: Payer: Self-pay | Admitting: Internal Medicine

## 2022-08-16 ENCOUNTER — Ambulatory Visit (INDEPENDENT_AMBULATORY_CARE_PROVIDER_SITE_OTHER): Payer: 59 | Admitting: Internal Medicine

## 2022-08-16 VITALS — BP 136/78 | HR 56 | Ht 64.0 in | Wt 141.4 lb

## 2022-08-16 DIAGNOSIS — I10 Essential (primary) hypertension: Secondary | ICD-10-CM

## 2022-08-16 DIAGNOSIS — Z95810 Presence of automatic (implantable) cardiac defibrillator: Secondary | ICD-10-CM | POA: Insufficient documentation

## 2022-08-16 DIAGNOSIS — I4901 Ventricular fibrillation: Secondary | ICD-10-CM

## 2022-08-16 NOTE — Patient Instructions (Addendum)
Medication Instructions:  Your physician recommends that you continue on your current medications as directed. Please refer to the Current Medication list given to you today.  *If you need a refill on your cardiac medications before your next appointment, please call your pharmacy*  NO CHANGES TO YOUR MEDICATIONS.   Lab Work: None ordered.  If you have labs (blood work) drawn today and your tests are completely normal, you will receive your results only by: MyChart Message (if you have MyChart) OR A paper copy in the mail If you have any lab test that is abnormal or we need to change your treatment, we will call you to review the results.  Testing/Procedures: None ordered.  Follow-Up:  IN 1 YEAR WITH DR. GREGG TAYLOR, [facility] [address].    Remote monitoring is used to monitor your ICD from home. This monitoring reduces the number of office visits required to check your device to one time per year. It allows [place] to keep an eye on the functioning of your device to ensure it is working properly. You are scheduled for a device check from home on 09/07/22. You may send your transmission at any time that day. If you have a wireless device, the transmission will be sent automatically. After your physician reviews your transmission, you will receive a postcard with your next transmission date.  Important Information About Sugar

## 2022-08-16 NOTE — Progress Notes (Signed)
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 She has done well in the interim. I have not seen her since the Covid pandemic began. She remains active and is still working though she plans to retire at the end of the year. She denies chest pain or sob. She notes that at home her bp is controlled. She has not had any ICD therapies.  Allergies  Allergen Reactions   Codeine Nausea And Vomiting   Penicillins Hives, Rash and Other (See Comments)    As a child Has patient had a PCN reaction causing immediate rash, facial/tongue/throat swelling, SOB or lightheadedness with hypotension:unsure Has patient had a PCN reaction causing severe rash involving mucus membranes or skin necrosis:unsure Has patient had a PCN reaction that required hospitalization:No Has patient had a PCN reaction occurring within the last 10 years:No If all of the above answers are "NO", then may proceed with Cephalosporin use.      Current Outpatient Medications  Medication Sig Dispense Refill   acetaminophen (TYLENOL) 500 MG tablet Take 500-1,000 mg every 6 (six) hours as needed by mouth for moderate pain or headache.      aspirin EC 81 MG tablet Take 81 mg by mouth daily.     carvedilol (COREG) 25 MG tablet TAKE 2 TABLETS BY MOUTH  TWICE DAILY WITH MEALS 360 tablet 0   diphenhydrAMINE (BENADRYL) 25 MG tablet Take 50 mg by mouth at bedtime as needed for allergies.     enalapril (VASOTEC) 10 MG tablet TAKE 1 TABLET BY MOUTH  TWICE DAILY 180 tablet 0   hydrochlorothiazide (HYDRODIURIL) 25 MG tablet TAKE 1 TABLET BY MOUTH  DAILY 90 tablet 0   Omega-3 Fatty Acids (FISH OIL) 1000 MG CAPS Take 2 capsules (2,000 mg total) by mouth daily. 180 capsule 3   potassium chloride SA (KLOR-CON M) 20 MEQ tablet TAKE 1 TABLET BY MOUTH  DAILY 90 tablet 0   rosuvastatin (CRESTOR) 10 MG tablet TAKE 1 TABLET BY MOUTH  DAILY 90 tablet 0    gabapentin (NEURONTIN) 100 MG capsule Take 1 capsule (100 mg total) by mouth 2 (two) times daily. (Patient not taking: Reported on 08/16/2022) 30 capsule 1   No current facility-administered medications for this visit.     Past Medical History:  Diagnosis Date   AICD (automatic cardioverter/defibrillator) present    Arthritis    HTN (hypertension)    Myocardial infarction (HCC)    mild Mi 2000   PONV (postoperative nausea and vomiting)    Ventricular fibrillation (HCC)     ROS:   All systems reviewed and negative except as noted in the HPI.   Past Surgical History:  Procedure Laterality Date   IMPLANTABLE CARDIOVERTER DEFIBRILLATOR (ICD) GENERATOR CHANGE N/A 10/14/2014   Procedure: ICD GENERATOR CHANGE;  Surgeon: Marinus Maw, MD;  Location: [facility] CATH LAB;  Service: Cardiovascular;  Laterality: N/A;   IMPLANTABLE CARDIOVERTER DEFIBRILLATOR IMPLANT  2000; 10-14-2014   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; generator change 09-2014 by Dr Ladona Ridgel   TOTAL HIP ARTHROPLASTY Left 01/28/2017   Procedure: LEFT TOTAL HIP ARTHROPLASTY ANTERIOR APPROACH;  Surgeon: Kathryne Hitch, MD;  Location: [facility];  Service: Orthopedics;  Laterality: Left;   TOTAL HIP ARTHROPLASTY Right 11/11/2017   Procedure: RIGHT TOTAL HIP ARTHROPLASTY ANTERIOR APPROACH;  Surgeon: Kathryne Hitch, MD;  Location: [facility];  Service: Orthopedics;  Laterality: Right;     Family History  Problem Relation Age of Onset   Heart attack Mother    Heart attack Father        x2   Diabetes Maternal Aunt    Diabetes Maternal Grandmother    Heart Problems Maternal Grandmother      Social History   Socioeconomic History   Marital status: Widowed    Spouse name: Not on file   Number of children: Not on file   Years of education: Not on file   Highest education level: Not on file  Occupational History   Not on file  Tobacco Use   Smoking status: Former    Types: Cigarettes    Quit date:  10/27/1999    Years since quitting: 22.8   Smokeless tobacco: Never  Vaping Use   Vaping Use: Never used  Substance and Sexual Activity   Alcohol use: No   Drug use: No   Sexual activity: Not on file  Other Topics Concern   Not on file  Social History Narrative   Not on file   Social Determinants of Health   Financial Resource Strain: Not on file  Food Insecurity: Not on file  Transportation Needs: Not on file  Physical Activity: Not on file  Stress: Not on file  Social Connections: Not on file  Intimate Partner Violence: Not on file     BP 136/78   Pulse (!) 56   Ht 5\' 4"  (1.626 m)   Wt 141 lb 6.4 oz (64.1 kg)   SpO2 97%   BMI 24.27 kg/m   Physical Exam:  Well appearing NAD HEENT: Unremarkable Neck:  No JVD, no thyromegally Lymphatics:  No adenopathy Back:  No CVA tenderness Lungs:  Clear with no wheezes HEART:  Regular rate rhythm, no murmurs, no rubs, no clicks Abd:  soft, positive bowel sounds, no organomegally, no rebound, no guarding Ext:  2 plus pulses, no edema, no cyanosis, no clubbing Skin:  No rashes no nodules Neuro:  CN II through XII intact, motor grossly intact  EKG NSR  DEVICE  Normal device function.  See PaceArt for details.   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 No change in medical therapy. ICD - her medtronic single chamber ICD is working normally. No change in medical therapy. HTN - for once her bp is well controlled. No change in her meds. She does not have much increase in her voltage on the 12 lead ECG. CAD - she denies anginal symptoms with no problems since her MI over 20 years ago.  Naliya Gish,MD

## 2022-08-17 ENCOUNTER — Other Ambulatory Visit: Payer: Self-pay

## 2022-08-17 DIAGNOSIS — Z95810 Presence of automatic (implantable) cardiac defibrillator: Secondary | ICD-10-CM

## 2022-08-17 MED ORDER — ROSUVASTATIN CALCIUM 10 MG PO TABS: 10.0000 mg | ORAL_TABLET | Freq: Every day | ORAL | 3 refills | Status: DC

## 2022-08-17 MED ORDER — HYDROCHLOROTHIAZIDE 25 MG PO TABS: 25.0000 mg | ORAL_TABLET | Freq: Every day | ORAL | 3 refills | Status: DC

## 2022-08-17 MED ORDER — ENALAPRIL MALEATE 10 MG PO TABS: 10.0000 mg | ORAL_TABLET | Freq: Two times a day (BID) | ORAL | 3 refills | Status: DC

## 2022-08-17 MED ORDER — POTASSIUM CHLORIDE CRYS ER 20 MEQ PO TBCR: 20.0000 meq | EXTENDED_RELEASE_TABLET | Freq: Every day | ORAL | 3 refills | Status: DC

## 2022-08-17 MED ORDER — CARVEDILOL 25 MG PO TABS: 50.0000 mg | ORAL_TABLET | Freq: Two times a day (BID) | ORAL | 3 refills | Status: DC

## 2022-09-02 ENCOUNTER — Ambulatory Visit (INDEPENDENT_AMBULATORY_CARE_PROVIDER_SITE_OTHER): Payer: 59 | Admitting: Adult Health

## 2022-09-02 ENCOUNTER — Encounter: Payer: Self-pay | Admitting: Adult Health

## 2022-09-02 VITALS — BP 140/60 | HR 57 | Temp 97.9°F | Ht 63.0 in | Wt 139.0 lb

## 2022-09-02 DIAGNOSIS — I10 Essential (primary) hypertension: Secondary | ICD-10-CM | POA: Diagnosis not present

## 2022-09-02 DIAGNOSIS — E785 Hyperlipidemia, unspecified: Secondary | ICD-10-CM | POA: Diagnosis not present

## 2022-09-02 DIAGNOSIS — Z95810 Presence of automatic (implantable) cardiac defibrillator: Secondary | ICD-10-CM

## 2022-09-02 DIAGNOSIS — E2839 Other primary ovarian failure: Secondary | ICD-10-CM

## 2022-09-02 DIAGNOSIS — Z7689 Persons encountering health services in other specified circumstances: Secondary | ICD-10-CM

## 2022-09-02 NOTE — Progress Notes (Signed)
Patient presents to clinic today to establish care. She is a pleasant 81 year old female who  has a past medical history of AICD (automatic cardioverter/defibrillator) present, Arthritis, HTN (hypertension), Myocardial infarction (HCC), PONV (postoperative nausea and vomiting), and Ventricular fibrillation (HCC).  She is retiring at the end of the month.   Acute Concerns: Establish Care  Chronic Issues: Ventricular Fibrillation - history of V/F arrest in 2000.  She has an ICD inserted.  Has not had any ICD incidents.  Nuys chest pain or shortness of breath  Hypertension -managed with the milligrams twice daily, enalapril 10 mg daily, and HCTZ 25 mg daily.  Who is managed by cardiology.  She does take a potassium supplement to prevent hypocalcemia.  She checks her blood pressures at home and reports readings in the 120s over 80s.  She denies dizziness, lightheadedness, chest pain, or shortness of breath. BP goes up in the office setting.  BP Readings from Last 3 Encounters:  09/02/22 (!) 140/60  08/16/22 136/78  07/14/21 (!) 202/90   Hyperlipidemia /CAD -3 of MI 20 years ago.  Currently managed with Crestor 10 mg and fish oil.  Health Maintenance: Dental -- Does not do routine care Vision -- Routine Care Immunizations -- UTD  Colonoscopy -- aged out   Bone Density -- Never had    Past Medical History:  Diagnosis Date   AICD (automatic cardioverter/defibrillator) present    Arthritis    HTN (hypertension)    Myocardial infarction (HCC)    mild Mi 2000   PONV (postoperative nausea and vomiting)    Ventricular fibrillation (HCC)     Past Surgical History:  Procedure Laterality Date   IMPLANTABLE CARDIOVERTER DEFIBRILLATOR (ICD) GENERATOR CHANGE N/A 10/14/2014   Procedure: ICD GENERATOR CHANGE;  Surgeon: Marinus Maw, MD;  Location: [facility] CATH LAB;  Service: Cardiovascular;  Laterality: N/A;   IMPLANTABLE CARDIOVERTER DEFIBRILLATOR IMPLANT  2000; 10-14-2014   MDT single  chamber ICD implanted for VF arrest 2000; generator change 09-2014 by Dr Ladona Ridgel   TOTAL HIP ARTHROPLASTY Left 01/28/2017   Procedure: LEFT TOTAL HIP ARTHROPLASTY ANTERIOR APPROACH;  Surgeon: Kathryne Hitch, MD;  Location: [facility];  Service: Orthopedics;  Laterality: Left;   TOTAL HIP ARTHROPLASTY Right 11/11/2017   Procedure: RIGHT TOTAL HIP ARTHROPLASTY ANTERIOR APPROACH;  Surgeon: Kathryne Hitch, MD;  Location: [facility];  Service: Orthopedics;  Laterality: Right;    Current Outpatient Medications on File Prior to Visit  Medication Sig Dispense Refill   acetaminophen (TYLENOL) 500 MG tablet Take 500-1,000 mg every 6 (six) hours as needed by mouth for moderate pain or headache.      aspirin EC 81 MG tablet Take 81 mg by mouth daily.     carvedilol (COREG) 25 MG tablet Take 2 tablets (50 mg total) by mouth 2 (two) times daily with a meal. 360 tablet 3   diphenhydrAMINE (BENADRYL) 25 MG tablet Take 50 mg by mouth at bedtime as needed for allergies.     enalapril (VASOTEC) 10 MG tablet Take 1 tablet (10 mg total) by mouth 2 (two) times daily. 180 tablet 3   gabapentin (NEURONTIN) 100 MG capsule Take 1 capsule (100 mg total) by mouth 2 (two) times daily. (Patient not taking: Reported on 08/16/2022) 30 capsule 1   hydrochlorothiazide (HYDRODIURIL) 25 MG tablet Take 1 tablet (25 mg total) by mouth daily. 90 tablet 3   Omega-3 Fatty Acids (FISH OIL) 1000 MG CAPS Take 2 capsules (2,000 mg total) by  mouth daily. 180 capsule 3   potassium chloride SA (KLOR-CON M) 20 MEQ tablet Take 1 tablet (20 mEq total) by mouth daily. 90 tablet 3   rosuvastatin (CRESTOR) 10 MG tablet Take 1 tablet (10 mg total) by mouth daily. 90 tablet 3   No current facility-administered medications on file prior to visit.    Allergies  Allergen Reactions   Codeine Nausea And Vomiting   Penicillins Hives, Rash and Other (See Comments)    As a child Has patient had a PCN reaction causing immediate rash,  facial/tongue/throat swelling, SOB or lightheadedness with hypotension:unsure Has patient had a PCN reaction causing severe rash involving mucus membranes or skin necrosis:unsure Has patient had a PCN reaction that required hospitalization:No Has patient had a PCN reaction occurring within the last 10 years:No If all of the above answers are "NO", then may proceed with Cephalosporin use.     Family History  Problem Relation Age of Onset   Heart attack Mother    Heart attack Father        x2   Diabetes Maternal Aunt    Diabetes Maternal Grandmother    Heart Problems Maternal Grandmother     Social History   Socioeconomic History   Marital status: Widowed    Spouse name: Not on file   Number of children: Not on file   Years of education: Not on file   Highest education level: Not on file  Occupational History   Not on file  Tobacco Use   Smoking status: Former    Types: Cigarettes    Quit date: 10/27/1999    Years since quitting: 22.8   Smokeless tobacco: Never  Vaping Use   Vaping Use: Never used  Substance and Sexual Activity   Alcohol use: No   Drug use: No   Sexual activity: Not on file  Other Topics Concern   Not on file  Social History Narrative   Not on file   Social Determinants of Health   Financial Resource Strain: Not on file  Food Insecurity: Not on file  Transportation Needs: Not on file  Physical Activity: Not on file  Stress: Not on file  Social Connections: Not on file  Intimate Partner Violence: Not on file    ROS  There were no vitals taken for this visit.  Physical Exam  Recent Results (from the past 2160 hour(s))  CUP PACEART REMOTE DEVICE CHECK     Status: None   Collection Time: 06/09/22  6:23 PM  Result Value Ref Range   Date Time Interrogation Session [id number]    Pulse Generator Manufacturer MERM    Pulse Gen Model [id number] Evera XT VR    Pulse Gen Serial Number [id number]    Clinic Name [facility]    Implantable  Pulse Generator Type Implantable Cardiac Defibulator    Implantable Pulse Generator Implant Date [id number]    Implantable Lead Manufacturer [facility]    Implantable Lead Model [phone] Sprint    Implantable Lead Serial Number [id number]    Implantable Lead Implant Date [id number]    Implantable Lead Location Detail 1 APEX    Implantable Lead Special Function Diagnosis:  VF/Sudden Cardiac Death    Implantable Lead Location [phone]    Lead Channel Setting Sensing Sensitivity 0.3 mV   Lead Channel Setting Pacing Pulse Width 0.4 ms   Lead Channel Setting Pacing Amplitude 2 V   Lead Channel Impedance Value 399 ohm   Lead Channel Impedance Value 418 ohm  Lead Channel Sensing Intrinsic Amplitude 1.625 mV   Lead Channel Sensing Intrinsic Amplitude 1.625 mV   Lead Channel Pacing Threshold Amplitude 0.5 V   Lead Channel Pacing Threshold Pulse Width 0.4 ms   HighPow Impedance 44 ohm   HighPow Impedance 58 ohm   Battery Status OK    Battery Remaining Longevity 42 mo   Battery Voltage 2.96 V   Brady Statistic RV Percent Paced 0.01 %    Assessment/Plan: 1. Encounter to establish care - Follow up for CPE or sooner if needed  2. AICD (automatic cardioverter/defibrillator) present - Per cardiology   3. Hyperlipidemia, unspecified hyperlipidemia type - Continue with statin and fish oil   4. Essential hypertension - Controlled at home. Continue with current medications   5. Estrogen deficiency  - DG Bone Density; Future  Shirline Frees, NP

## 2022-09-02 NOTE — Patient Instructions (Signed)
It was great meeting you today and am glad to have you   Please follow up with me for your physical

## 2022-09-07 ENCOUNTER — Ambulatory Visit (INDEPENDENT_AMBULATORY_CARE_PROVIDER_SITE_OTHER): Payer: 59

## 2022-09-07 DIAGNOSIS — I4901 Ventricular fibrillation: Secondary | ICD-10-CM

## 2022-09-10 LAB — CUP PACEART REMOTE DEVICE CHECK
Battery Remaining Longevity: 38 mo
Battery Voltage: 2.96 V
Brady Statistic RV Percent Paced: 0.03 %
Date Time Interrogation Session: 20230912043623
HighPow Impedance: 41 Ohm
HighPow Impedance: 51 Ohm
Implantable Lead Implant Date: 20000821
Implantable Lead Location: 753860
Implantable Lead Model: 6942
Implantable Pulse Generator Implant Date: 20151019
Lead Channel Impedance Value: 361 Ohm
Lead Channel Impedance Value: 361 Ohm
Lead Channel Pacing Threshold Amplitude: 0.5 V
Lead Channel Pacing Threshold Pulse Width: 0.4 ms
Lead Channel Sensing Intrinsic Amplitude: 4.125 mV
Lead Channel Sensing Intrinsic Amplitude: 4.125 mV
Lead Channel Setting Pacing Amplitude: 2 V
Lead Channel Setting Pacing Pulse Width: 0.4 ms
Lead Channel Setting Sensing Sensitivity: 0.3 mV

## 2022-09-23 NOTE — Progress Notes (Signed)
Remote ICD transmission.   

## 2022-11-09 ENCOUNTER — Encounter: Payer: 59 | Admitting: Adult Health

## 2022-12-07 ENCOUNTER — Ambulatory Visit (INDEPENDENT_AMBULATORY_CARE_PROVIDER_SITE_OTHER): Payer: Medicare Other

## 2022-12-07 DIAGNOSIS — I4901 Ventricular fibrillation: Secondary | ICD-10-CM

## 2022-12-08 LAB — CUP PACEART REMOTE DEVICE CHECK
Battery Remaining Longevity: 36 mo
Battery Voltage: 2.95 V
Brady Statistic RV Percent Paced: 0.02 %
Date Time Interrogation Session: 20231213031706
HighPow Impedance: 43 Ohm
HighPow Impedance: 53 Ohm
Implantable Lead Connection Status: 753985
Implantable Lead Implant Date: 20000821
Implantable Lead Location: 753860
Implantable Lead Model: 6942
Implantable Pulse Generator Implant Date: 20151019
Lead Channel Impedance Value: 399 Ohm
Lead Channel Impedance Value: 399 Ohm
Lead Channel Pacing Threshold Amplitude: 0.5 V
Lead Channel Pacing Threshold Pulse Width: 0.4 ms
Lead Channel Sensing Intrinsic Amplitude: 4.375 mV
Lead Channel Sensing Intrinsic Amplitude: 4.375 mV
Lead Channel Setting Pacing Amplitude: 2 V
Lead Channel Setting Pacing Pulse Width: 0.4 ms
Lead Channel Setting Sensing Sensitivity: 0.3 mV

## 2023-01-06 NOTE — Progress Notes (Signed)
Remote ICD transmission.   

## 2023-01-19 ENCOUNTER — Ambulatory Visit (INDEPENDENT_AMBULATORY_CARE_PROVIDER_SITE_OTHER): Payer: Medicare Other | Admitting: Adult Health

## 2023-01-19 ENCOUNTER — Encounter: Payer: Self-pay | Admitting: Adult Health

## 2023-01-19 VITALS — BP 120/80 | HR 55 | Temp 97.7°F | Ht 63.0 in | Wt 139.0 lb

## 2023-01-19 DIAGNOSIS — I4901 Ventricular fibrillation: Secondary | ICD-10-CM | POA: Diagnosis not present

## 2023-01-19 DIAGNOSIS — E785 Hyperlipidemia, unspecified: Secondary | ICD-10-CM

## 2023-01-19 DIAGNOSIS — Z95810 Presence of automatic (implantable) cardiac defibrillator: Secondary | ICD-10-CM

## 2023-01-19 DIAGNOSIS — Z0000 Encounter for general adult medical examination without abnormal findings: Secondary | ICD-10-CM

## 2023-01-19 DIAGNOSIS — I10 Essential (primary) hypertension: Secondary | ICD-10-CM | POA: Diagnosis not present

## 2023-01-19 DIAGNOSIS — R7309 Other abnormal glucose: Secondary | ICD-10-CM

## 2023-01-19 LAB — CBC WITH DIFFERENTIAL/PLATELET
Basophils Absolute: 0 10*3/uL (ref 0.0–0.1)
Basophils Relative: 0.5 % (ref 0.0–3.0)
Eosinophils Absolute: 0.1 10*3/uL (ref 0.0–0.7)
Eosinophils Relative: 1.3 % (ref 0.0–5.0)
HCT: 38.7 % (ref 36.0–46.0)
Hemoglobin: 12.7 g/dL (ref 12.0–15.0)
Lymphocytes Relative: 16.8 % (ref 12.0–46.0)
Lymphs Abs: 1.3 10*3/uL (ref 0.7–4.0)
MCHC: 32.8 g/dL (ref 30.0–36.0)
MCV: 87.9 fl (ref 78.0–100.0)
Monocytes Absolute: 0.6 10*3/uL (ref 0.1–1.0)
Monocytes Relative: 8.3 % (ref 3.0–12.0)
Neutro Abs: 5.7 10*3/uL (ref 1.4–7.7)
Neutrophils Relative %: 73.1 % (ref 43.0–77.0)
Platelets: 163 10*3/uL (ref 150.0–400.0)
RBC: 4.41 Mil/uL (ref 3.87–5.11)
RDW: 14.9 % (ref 11.5–15.5)
WBC: 7.8 10*3/uL (ref 4.0–10.5)

## 2023-01-19 LAB — COMPREHENSIVE METABOLIC PANEL
ALT: 25 U/L (ref 0–35)
AST: 27 U/L (ref 0–37)
Albumin: 4.7 g/dL (ref 3.5–5.2)
Alkaline Phosphatase: 60 U/L (ref 39–117)
BUN: 22 mg/dL (ref 6–23)
CO2: 28 mEq/L (ref 19–32)
Calcium: 9.5 mg/dL (ref 8.4–10.5)
Chloride: 101 mEq/L (ref 96–112)
Creatinine, Ser: 0.83 mg/dL (ref 0.40–1.20)
GFR: 66.17 mL/min (ref >60.00)
Glucose, Bld: 139 mg/dL — ABNORMAL HIGH (ref 70–99)
Potassium: 4.6 mEq/L (ref 3.5–5.1)
Sodium: 137 mEq/L (ref 135–145)
Total Bilirubin: 0.6 mg/dL (ref 0.2–1.2)
Total Protein: 7.6 g/dL (ref 6.0–8.3)

## 2023-01-19 LAB — LIPID PANEL
Cholesterol: 102 mg/dL (ref 0–200)
HDL: 38.5 mg/dL — ABNORMAL LOW (ref >39.00)
LDL Cholesterol: 39 mg/dL (ref 0–99)
NonHDL: 63.74
Total CHOL/HDL Ratio: 3
Triglycerides: 122 mg/dL (ref 0.0–149.0)
VLDL: 24.4 mg/dL (ref 0.0–40.0)

## 2023-01-19 LAB — HEMOGLOBIN A1C: Hgb A1c MFr Bld: 6.3 % (ref 4.6–6.5)

## 2023-01-19 LAB — TSH: TSH: 1.75 u[IU]/mL (ref 0.35–5.50)

## 2023-01-19 NOTE — Addendum Note (Signed)
Addended by: Octavio Manns E on: 01/19/2023 11:35 AM   Modules accepted: Orders

## 2023-01-19 NOTE — Patient Instructions (Addendum)
It was great seeing you today   We will follow up with you regarding your lab work   Please let me know if you need anything   Please call the breast center for your bone density  [phone]  [phone]

## 2023-01-19 NOTE — Progress Notes (Signed)
Subjective:    Patient ID: April [name], female    DOB: 03/21/1941, 82 y.o.   MRN: [id number]  HPI Patient presents for yearly preventative medicine examination. She is a pleasant 82 year old female who  has a past medical history of AICD (automatic cardioverter/defibrillator) present, Arthritis, HTN (hypertension), Myocardial infarction (HCC), PONV (postoperative nausea and vomiting), and Ventricular fibrillation (HCC).  Ventricular Fibrillation - history of V/F arrest in 2000.  She has an ICD inserted.  Has not had any ICD incidents. Denies chest pain or shortness of breath  Hypertension -managed with Coreg 25 mg twice daily, enalapril 10 mg daily, and HCTZ 25 mg daily.  Who is managed by cardiology.  She does take a potassium supplement to prevent hypocalcemia.  She checks her blood pressures at home and reports readings in the 120s over 80s.  She denies dizziness, lightheadedness, chest pain, or shortness of breath. BP goes up in the office setting.  BP Readings from Last 3 Encounters:  01/19/23 120/80  09/02/22 (!) 140/60  08/16/22 136/78   Hyperlipidemia /CAD - MI 20 years ago.  Currently managed with Crestor 10 mg, ASA 81 and fish oil. Lab Results  Component Value Date   CHOL 101 06/04/2020   HDL 35 (L) 06/04/2020   LDLCALC 40 06/04/2020   TRIG 152 (H) 06/04/2020   CHOLHDL 2.9 06/04/2020    All immunizations and health maintenance protocols were reviewed with the patient and needed orders were placed. She is up to date on routine vaccinations   Appropriate screening laboratory values were ordered for the patient including screening of hyperlipidemia, renal function and hepatic function.  Medication reconciliation,  past medical history, social history, problem list and allergies were reviewed in detail with the patient  Goals were established with regard to weight loss, exercise, and  diet in compliance with medications. She is exercising on a routine basis and is eating  healthy.  Wt Readings from Last 3 Encounters:  01/19/23 139 lb (63 kg)  09/02/22 139 lb (63 kg)  08/16/22 141 lb 6.4 oz (64.1 kg)   Review of Systems  Constitutional: Negative.   HENT: Negative.    Eyes: Negative.   Respiratory: Negative.    Cardiovascular: Negative.   Gastrointestinal: Negative.   Endocrine: Negative.   Genitourinary: Negative.   Musculoskeletal: Negative.   Skin: Negative.   Allergic/Immunologic: Negative.   Neurological: Negative.   Hematological: Negative.   Psychiatric/Behavioral: Negative.     Past Medical History:  Diagnosis Date   AICD (automatic cardioverter/defibrillator) present    Arthritis    HTN (hypertension)    Myocardial infarction (HCC)    mild Mi 2000   PONV (postoperative nausea and vomiting)    Ventricular fibrillation (HCC)     Social History   Socioeconomic History   Marital status: Widowed    Spouse name: Not on file   Number of children: Not on file   Years of education: Not on file   Highest education level: Not on file  Occupational History   Not on file  Tobacco Use   Smoking status: Former    Types: Cigarettes    Quit date: 10/27/1999    Years since quitting: 23.2   Smokeless tobacco: Never  Vaping Use   Vaping Use: Never used  Substance and Sexual Activity   Alcohol use: No   Drug use: No   Sexual activity: Not on file  Other Topics Concern   Not on file  Social History Narrative  Not on file   Social Determinants of Health   Financial Resource Strain: Not on file  Food Insecurity: Not on file  Transportation Needs: Not on file  Physical Activity: Not on file  Stress: Not on file  Social Connections: Not on file  Intimate Partner Violence: Not on file    Past Surgical History:  Procedure Laterality Date   IMPLANTABLE CARDIOVERTER DEFIBRILLATOR (ICD) GENERATOR CHANGE N/A 10/14/2014   Procedure: ICD GENERATOR CHANGE;  Surgeon: Evans Lance, MD;  Location: [facility] CATH LAB;  Service: Cardiovascular;   Laterality: N/A;   IMPLANTABLE CARDIOVERTER DEFIBRILLATOR IMPLANT  2000; 10-14-2014   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; generator change 09-2014 by Dr Lovena Le   TOTAL HIP ARTHROPLASTY Left 01/28/2017   Procedure: LEFT TOTAL HIP ARTHROPLASTY ANTERIOR APPROACH;  Surgeon: Mcarthur Rossetti, MD;  Location: [facility];  Service: Orthopedics;  Laterality: Left;   TOTAL HIP ARTHROPLASTY Right 11/11/2017   Procedure: RIGHT TOTAL HIP ARTHROPLASTY ANTERIOR APPROACH;  Surgeon: Mcarthur Rossetti, MD;  Location: [facility];  Service: Orthopedics;  Laterality: Right;    Family History  Problem Relation Age of Onset   Heart attack Mother    Heart attack Father        x2   Diabetes Maternal Aunt    Diabetes Maternal Grandmother    Heart Problems Maternal Grandmother     Allergies  Allergen Reactions   Codeine Nausea And Vomiting   Penicillins Hives, Rash and Other (See Comments)    As a child Has patient had a PCN reaction causing immediate rash, facial/tongue/throat swelling, SOB or lightheadedness with hypotension:unsure Has patient had a PCN reaction causing severe rash involving mucus membranes or skin necrosis:unsure Has patient had a PCN reaction that required hospitalization:No Has patient had a PCN reaction occurring within the last 10 years:No If all of the above answers are "NO", then may proceed with Cephalosporin use.     Current Outpatient Medications on File Prior to Visit  Medication Sig Dispense Refill   acetaminophen (TYLENOL) 500 MG tablet Take 500-1,000 mg every 6 (six) hours as needed by mouth for moderate pain or headache.      aspirin EC 81 MG tablet Take 81 mg by mouth daily.     carvedilol (COREG) 25 MG tablet Take 2 tablets (50 mg total) by mouth 2 (two) times daily with a meal. 360 tablet 3   diphenhydrAMINE (BENADRYL) 25 MG tablet Take 50 mg by mouth at bedtime as needed for allergies.     enalapril (VASOTEC) 10 MG tablet Take 1 tablet (10 mg total)  by mouth 2 (two) times daily. 180 tablet 3   gabapentin (NEURONTIN) 100 MG capsule Take 1 capsule (100 mg total) by mouth 2 (two) times daily. 30 capsule 1   hydrochlorothiazide (HYDRODIURIL) 25 MG tablet Take 1 tablet (25 mg total) by mouth daily. 90 tablet 3   Omega-3 Fatty Acids (FISH OIL) 1000 MG CAPS Take 2 capsules (2,000 mg total) by mouth daily. 180 capsule 3   potassium chloride SA (KLOR-CON M) 20 MEQ tablet Take 1 tablet (20 mEq total) by mouth daily. 90 tablet 3   rosuvastatin (CRESTOR) 10 MG tablet Take 1 tablet (10 mg total) by mouth daily. 90 tablet 3   No current facility-administered medications on file prior to visit.    BP 120/80 Comment: home  Pulse (!) 55   Temp 97.7 F (36.5 C) (Oral)   Ht 5\' 3"  (1.6 m)   Wt  139 lb (63 kg)   SpO2 98%   BMI 24.62 kg/m       Objective:   Physical Exam Vitals and nursing note reviewed.  Constitutional:      Appearance: Normal appearance.  Cardiovascular:     Rate and Rhythm: Normal rate and regular rhythm.     Pulses: Normal pulses.     Heart sounds: Normal heart sounds.  Pulmonary:     Effort: Pulmonary effort is normal.     Breath sounds: Normal breath sounds.  Musculoskeletal:        General: Normal range of motion.  Skin:    General: Skin is warm and dry.  Neurological:     General: No focal deficit present.     Mental Status: She is alert and oriented to person, place, and time.  Psychiatric:        Mood and Affect: Mood normal.        Behavior: Behavior normal.        Thought Content: Thought content normal.        Judgment: Judgment normal.        Assessment & Plan:  1. Routine general medical examination at a health care facility Today patient counseled on age appropriate routine health concerns for screening and prevention, each reviewed and up to date or declined. Immunizations reviewed and up to date or declined. Labs ordered and reviewed. Risk factors for depression reviewed and negative. Hearing  function and visual acuity are intact. ADLs screened and addressed as needed. Functional ability and level of safety reviewed and appropriate. Education, counseling and referrals performed based on assessed risks today. Patient provided with a copy of personalized plan for preventive services. - Follow up in one year or sooner if needed  2. Hyperlipidemia, unspecified hyperlipidemia type - Consider increasing statin  - CBC with Differential/Platelet; Future - Comprehensive metabolic panel; Future - Lipid panel; Future - TSH; Future  3. Essential hypertension - Well controlled. No change in medication  - CBC with Differential/Platelet; Future - Comprehensive metabolic panel; Future - Lipid panel; Future - TSH; Future  4. AICD (automatic cardioverter/defibrillator) present - no events  - CBC with Differential/Platelet; Future - Comprehensive metabolic panel; Future - Lipid panel; Future - TSH; Future  5. VENTRICULAR FIBRILLATION - SR today  - CBC with Differential/Platelet; Future - Comprehensive metabolic panel; Future - Lipid panel; Future - TSH; Future  Dorothyann Peng, NP

## 2023-01-21 ENCOUNTER — Other Ambulatory Visit: Payer: Self-pay

## 2023-01-21 MED ORDER — METFORMIN HCL 500 MG PO TABS: 500.0000 mg | ORAL_TABLET | Freq: Every day | ORAL | 0 refills | Status: DC

## 2023-02-04 ENCOUNTER — Telehealth: Payer: Self-pay | Admitting: Adult Health

## 2023-02-04 MED ORDER — METFORMIN HCL 500 MG PO TABS: 500.0000 mg | ORAL_TABLET | Freq: Every day | ORAL | 1 refills | Status: DC

## 2023-02-04 NOTE — Telephone Encounter (Signed)
Pt call and stated she want her [facility] sent to  [place], [place] Phone: [phone]  Fax: [phone]    90 day refill

## 2023-02-04 NOTE — Telephone Encounter (Signed)
Rx sent to mail order

## 2023-03-01 ENCOUNTER — Telehealth: Payer: Self-pay | Admitting: Adult Health

## 2023-03-01 NOTE — Telephone Encounter (Signed)
Contacted April [name] to schedule their annual wellness visit. Appointment made for 03/15/23.  April [name] AWV direct phone # [phone]

## 2023-03-15 ENCOUNTER — Telehealth (INDEPENDENT_AMBULATORY_CARE_PROVIDER_SITE_OTHER): Payer: Medicare Other | Admitting: Family Medicine

## 2023-03-15 DIAGNOSIS — Z0000 Encounter for general adult medical examination without abnormal findings: Secondary | ICD-10-CM | POA: Diagnosis not present

## 2023-03-15 NOTE — Progress Notes (Signed)
PATIENT CHECK-IN and HEALTH RISK ASSESSMENT QUESTIONNAIRE:  -completed by phone/video for upcoming Medicare Preventive Visit  Pre-Visit Check-in: 1)Vitals (height, wt, BP, etc) - record in vitals section for visit on day of visit 2)Review and Update Medications, Allergies PMH, Surgeries, Social history in Epic 3)Hospitalizations in the last year with date/reason? No  4)Review and Update Care Team (patient's specialists) in Epic 5) Complete PHQ9 in Epic  6) Complete Fall Screening in Epic 7)Review all Health Maintenance Due and order under PCP if not done.  8)Medicare Wellness Questionnaire: Answer theses question about your habits: Do you drink alcohol? No If yes, how many drinks do you have a day?N/A Have you ever smoked?Yes   Quit date if applicable? Quit in 2000 How many packs a day do/did you smoke? 1/2 pack daily  Do you use smokeless tobacco?No Do you use an illicit drugs?No  Do you exercises? Yes IF so, what type and how many days/minutes per week? Walk 2 miles in morning, 2 miles  in evening at 30 mins - 7 days a week!!! Are you sexually active? No Number of partners?N/A Cooks at home, eats a lot of veggies and fruits - doesn't eat as much meat Typical breakfast: cheese stick, nutrigrain bar Typical lunch: spinach salad, or chicken salad  Typical dinner:Protein, vegetables Typical snacks:fruit  Beverages: water, decaf coffee sometimes, crystal lights  Answer theses question about you: Can you perform most household chores?Yes  Do you find it hard to follow a conversation in a noisy room?No  Do you often ask people to speak up or repeat themselves?No  Do you feel that you have a problem with memory?No Do you balance your checkbook and or bank acounts?Yes Do you feel safe at home?Yes Last dentist visit?dentures Do you need assistance with any of the following: Please note if so   Driving?-No   Feeding yourself?-No  Getting from bed to chair?-No  Getting to the  toilet?-No  Bathing or showering?-No  Dressing yourself?-No  Managing money?-No  Climbing a flight of stairs-No  Preparing meals?-No  Do you have Advanced Directives in place (Living Will, Healthcare Power or Attorney)? No - she plans to see a lawyer to set this up.    Last eye Exam and location?1 year ago. [facility] in [place]   Do you currently use prescribed or non-prescribed narcotic or opioid pain medications?No   Do you have a history or close family history of breast, ovarian, tubal or peritoneal cancer or a family member with BRCA (breast cancer susceptibility 1 and 2) gene mutations?  Nurse/Assistant Credentials/time stamp:[facility]   ----------------------------------------------------------------------------------------------------------------------------------------------------------------------------------------------------------------------   MEDICARE ANNUAL PREVENTIVE VISIT WITH PROVIDER: (Welcome to [employer], initial annual wellness or annual wellness exam)  Virtual Visit via Phone Note  I connected with April [name] on 03/15/23 by phone and verified that I am speaking with the correct person using two identifiers.  Location patient: home Location provider:work or home office Persons participating in the virtual visit: patient, provider  Concerns and/or follow up today: reports all is well   See HM section in Epic for other details of completed HM.    ROS: negative for report of fevers, unintentional weight loss, vision changes, vision loss, hearing loss or change, chest pain, sob, hemoptysis, melena, hematochezia, hematuria, falls, bleeding or bruising, loc, thoughts of suicide or self harm, memory loss  Patient-completed extensive health risk assessment - reviewed and discussed with the patient: See Health Risk Assessment completed with patient prior to the visit either above or  in recent phone note. This was reviewed in detailed with the patient  today and appropriate recommendations, orders and referrals were placed as needed per Summary below and patient instructions.   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   PONV (postoperative nausea and vomiting)    Ventricular fibrillation ([place])     Past Surgical History:  Procedure Laterality Date   IMPLANTABLE CARDIOVERTER DEFIBRILLATOR (ICD) GENERATOR CHANGE N/A 10/14/2014   Procedure: ICD GENERATOR CHANGE;  Surgeon: Evans Lance, MD;  Location: [facility] CATH LAB;  Service: Cardiovascular;  Laterality: N/A;   IMPLANTABLE CARDIOVERTER DEFIBRILLATOR IMPLANT  2000; 10-14-2014   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; generator change 09-2014 by Dr Lovena Le   TOTAL HIP ARTHROPLASTY Left 01/28/2017   Procedure: LEFT TOTAL HIP ARTHROPLASTY ANTERIOR APPROACH;  Surgeon: Mcarthur Rossetti, MD;  Location: [facility] OR[facility];  Service: Orthopedics;  Laterality: Left;   TOTAL HIP ARTHROPLASTY Right 11/11/2017   Procedure: RIGHT TOTAL HIP ARTHROPLASTY ANTERIOR APPROACH;  Surgeon: Mcarthur Rossetti, MD;  Location: [facility];  Service: Orthopedics;  Laterality: Right;    Social History   Socioeconomic History   Marital status: Widowed    Spouse name: Not on file   Number of children: Not on file   Years of education: Not on file   Highest education level: Not on file  Occupational History   Not on file  Tobacco Use   Smoking status: Former    Types: Cigarettes    Quit date: 10/27/1999    Years since quitting: 23.3   Smokeless tobacco: Never  Vaping Use   Vaping Use: Never used   Substance and Sexual Activity   Alcohol use: No   Drug use: No   Sexual activity: Not on file  Other Topics Concern   Not on file  Social History Narrative   Not on file   Social Determinants of Health   Financial Resource Strain: Not on file  Food Insecurity: Not on file  Transportation Needs: Not on file  Physical Activity: Not on file  Stress: Not on file  Social Connections: Not on file  Intimate Partner Violence: Not on file    Family History  Problem Relation Age of Onset   Heart attack Mother    Heart attack Father        x2   Diabetes Maternal Aunt    Diabetes Maternal Grandmother    Heart Problems Maternal Grandmother     Current Outpatient Medications on File Prior to Visit  Medication Sig Dispense Refill   acetaminophen (TYLENOL) 500 MG tablet Take 500-1,000 mg every 6 (six) hours as needed by mouth for moderate pain or headache.      aspirin EC 81 MG tablet Take 81 mg by mouth daily.     carvedilol (COREG) 25 MG tablet Take 2 tablets (50 mg total) by mouth 2 (two) times daily with a meal. 360 tablet 3   diphenhydrAMINE (BENADRYL) 25 MG tablet Take 50 mg by mouth at bedtime as needed for allergies.     enalapril (VASOTEC) 10 MG tablet  Take 1 tablet (10 mg total) by mouth 2 (two) times daily. 180 tablet 3   hydrochlorothiazide (HYDRODIURIL) 25 MG tablet Take 1 tablet (25 mg total) by mouth daily. 90 tablet 3   metFORMIN (GLUCOPHAGE) 500 MG tablet Take 1 tablet (500 mg total) by mouth daily. 90 tablet 1   Omega-3 Fatty Acids (FISH OIL) 1000 MG CAPS Take 2 capsules (2,000 mg total) by mouth daily. 180 capsule 3   potassium chloride SA (KLOR-CON M) 20 MEQ tablet Take 1 tablet (20 mEq total) by mouth daily. 90 tablet 3   rosuvastatin (CRESTOR) 10 MG tablet Take 1 tablet (10 mg total) by mouth daily. 90 tablet 3   No current facility-administered medications on file prior to visit.    Allergies  Allergen Reactions   Codeine Nausea And Vomiting   Penicillins  Hives, Rash and Other (See Comments)    As a child Has patient had a PCN reaction causing immediate rash, facial/tongue/throat swelling, SOB or lightheadedness with hypotension:unsure Has patient had a PCN reaction causing severe rash involving mucus membranes or skin necrosis:unsure Has patient had a PCN reaction that required hospitalization:No Has patient had a PCN reaction occurring within the last 10 years:No If all of the above answers are "NO", then may proceed with Cephalosporin use.        Physical Exam There were no vitals filed for this visit. Estimated body mass index is 24.62 kg/m as calculated from the following:   Height as of 01/19/23: 5\' 3"  (1.6 m).   Weight as of 01/19/23: 139 lb (63 kg).  EKG (optional): deferred due to virtual visit  GENERAL: alert, oriented, no acute distress detected, full vision exam deferred due to pandemic and/or virtual encounter  PSYCH/NEURO: pleasant and cooperative, no obvious depression or anxiety, speech and thought processing grossly intact, Cognitive function grossly intact  Flowsheet Row Video Visit from 03/15/2023 in [place] [facility] [place]  PHQ-9 Total Score 0           03/15/2023    9:42 AM 09/02/2022   10:33 AM  Depression screen PHQ 2/9  Decreased Interest 0 0  Down, Depressed, Hopeless 0 0  PHQ - 2 Score 0 0  Altered sleeping 0   Tired, decreased energy 0   Change in appetite 0   Feeling bad or failure about yourself  0   Trouble concentrating 0   Moving slowly or fidgety/restless 0   Suicidal thoughts 0   PHQ-9 Score 0   Difficult doing work/chores Not difficult at all        03/15/2023    9:42 AM  [place] in the past year? 0  Was there an injury with Fall? 0  Fall Risk Category Calculator 0     SUMMARY AND PLAN:  Encounter for Medicare annual wellness exam   Discussed applicable health maintenance/preventive health measures and advised and referred or ordered per patient  preferences:  Health Maintenance  Topic Date Due   DTaP/Tdap/Td (1 - Tdap) Reports she had this in 2022 at [facility] - asked staff to obtain copy to update record.    DEXA SCAN  Never done, she plans to schedule, has # to call and schedule - she agrees to do so.   Zoster Vaccines- Shingrix (2 of 2) 10/02/2017 - reports she had both doses at [facility] - asked staff to obtain copy and update record   COVID-19 Vaccine (8 - 2023-24 season) 10/31/2023 (Originally 11/08/2022)  Medicare Annual Wellness (AWV)  03/14/2024   Pneumonia Vaccine 37+ Years old  Completed   INFLUENZA VACCINE  Completed   HPV VACCINES  Aged Out    Education and counseling on the following was provided based on the above review of health and a plan/checklist for the patient, along with additional information discussed, was provided for the patient in the patient instructions :  -Advised on importance of and resources for completing advanced directives - she agrees to complete as reports has been on to do list, she prefers to see attorney.  -congratulated on exercise! Encouraged to continue regular exercise and discussed healthy whole foods based diet. Further info provided in patient instructions.    Follow up: see patient instructions     Patient Instructions  I really enjoyed getting to talk with you today! I am available on Tuesdays and Thursdays for virtual visits if you have any questions or concerns, or if I can be of any further assistance.   CHECKLIST FROM ANNUAL WELLNESS VISIT:  -Follow up (please call to schedule if not scheduled after visit):   -yearly for annual wellness visit with primary care office  Here is a list of your preventive care/health maintenance measures and the plan for each if any are due:  Health Maintenance  Topic Date Due   DTaP/Tdap/Td (1 - Tdap) Please obtain record to update   DEXA SCAN  Please call to scheduled   Zoster Vaccines- Shingrix (2 of 2) Please obtain record    COVID-19 Vaccine (8 - 2023-24 season) 10/31/2023 (Originally 11/08/2022)   Medicare Annual Wellness (AWV)  03/14/2024   Pneumonia Vaccine 57+ Years old  Completed   INFLUENZA VACCINE  Completed   HPV VACCINES  Aged Out    -See a dentist at least yearly  -Get your eyes checked and then per your eye specialist's recommendations  -Other issues addressed today: -Please complete advanced directives and bring a Copy to your primary care provider. Thank you.   -I have included below further information regarding a healthy whole foods based diet, physical activity guidelines for adults, stress management and opportunities for social connections. I hope you find this information useful.   -----------------------------------------------------------------------------------------------------------------------------------------------------------------------------------------------------------------------------------------------------------  NUTRITION: -eat real food: lots of colorful vegetables (half the plate) and fruits -5-7 servings of vegetables and fruits per day (fresh or steamed is best), exp. 2 servings of vegetables with lunch and dinner and 2 servings of fruit per day. Berries and greens such as kale and collards are great choices.  -consume on a regular basis: whole grains (make sure first ingredient on label contains the word "whole"), fresh fruits, fish, nuts, seeds, healthy oils (such as olive oil, avocado oil, grape seed oil) -may eat small amounts of dairy and lean meat on occasion, but avoid processed meats such as ham, bacon, lunch meat, etc. -drink water -try to avoid fast food and pre-packaged foods, processed meat -most experts advise limiting sodium to < 2300mg  per day, should limit further is any chronic conditions such as high blood pressure, heart disease, diabetes, etc. The American Heart Association advised that < 1500mg  is is ideal -try to avoid foods that contain any  ingredients with names you do not recognize  -try to avoid sugar/sweets (except for the natural sugar that occurs in fresh fruit) -try to avoid sweet drinks -try to avoid white rice, white bread, pasta (unless whole grain), white or yellow potatoes  EXERCISE GUIDELINES FOR ADULTS: -if you wish to increase your physical activity, do so  gradually and with the approval of your doctor -STOP and seek medical care immediately if you have any chest pain, chest discomfort or trouble breathing when starting or increasing exercise  -move and stretch your body, legs, feet and arms when sitting for long periods -Physical activity guidelines for optimal health in adults: -least 150 minutes per week of aerobic exercise (can talk, but not sing) once approved by your doctor, 20-30 minutes of sustained activity or two 10 minute episodes of sustained activity every day.  -resistance training at least 2 days per week if approved by your doctor -balance exercises 3+ days per week:   Stand somewhere where you have something sturdy to hold onto if you lose balance.    1) lift up on toes, start with 5x per day and work up to 20x   2) stand and lift on leg straight out to the side so that foot is a few inches of the floor, start with 5x each side and work up to 20x each side   3) stand on one foot, start with 5 seconds each side and work up to 20 seconds on each side  If you need ideas or help with getting more active:  -Silver sneakers https://tools.silversneakers.com  -Walk with a Doc: http://stephens-thompson.biz/  -try to include resistance (weight lifting/strength building) and balance exercises twice per week: or the following link for ideas: ChessContest.fr  UpdateClothing.com.cy  STRESS MANAGEMENT: -can try meditating, or just sitting quietly with deep breathing while intentionally relaxing all parts of your body  for 5 minutes daily -if you need further help with stress, anxiety or depression please follow up with your primary doctor or contact the wonderful folks at [facility]: [place]: -options in [place] if you wish to engage in more social and exercise related activities:  -Silver sneakers https://tools.silversneakers.com  -Walk with a Doc: http://stephens-thompson.biz/  -Check out the [place] 50+ section on the [place] [employer] (hiking clubs, book clubs, cards and games, chess, exercise classes, aquatic classes and much more) - see the website for details: https://www.[place].gov/departments/parks-recreation/active-adults50  -YouTube has lots of exercise videos for different ages and abilities as well  -[place] (a variety of indoor and outdoor inperson activities for adults). [phone]. [address]  -Virtual Online Classes (a variety of topics): see seniorplanet.org or call [phone]  -consider volunteering at a school, hospice center, church, senior center or elsewhere  ADVANCED HEALTHCARE DIRECTIVES:  Everyone should have advanced health care directives in place. This is so that you get the care you want, should you ever be in a situation where you are unable to make your own medical decisions.   From the [place] Advanced Directive Website: "[place] are legal documents in which you give written instructions about your health care if, in the future, you cannot speak for yourself.   A health care power of attorney allows you to name a person you trust to make your health care decisions if you cannot make them yourself. A declaration of a desire for a natural death (or living will) is document, which states that you desire not to have your life prolonged by extraordinary measures if you have a terminal or incurable illness or if you are in a vegetative state. An  advance instruction for mental health treatment makes a declaration of instructions, information and preferences regarding your mental health treatment. It also states that you are aware that the advance instruction authorizes a mental  health treatment provider to act according to your wishes. It may also outline your consent or refusal of mental health treatment. A declaration of an anatomical gift allows anyone over the age of 40 to make a gift by will, organ donor card or other document."   Please see the following website or an elder law attorney for forms, FAQs and for completion of advanced directives: Portal Secretary of [place] ([email or web address])  Or copy and paste the following to your web browser: [email or web address].[email or web address].cy       Lucretia [place], DO

## 2023-03-15 NOTE — Patient Instructions (Addendum)
I really enjoyed getting to talk with you today! I am available on Tuesdays and Thursdays for virtual visits if you have any questions or concerns, or if I can be of any further assistance.   CHECKLIST FROM ANNUAL WELLNESS VISIT:  -Follow up (please call to schedule if not scheduled after visit):   -yearly for annual wellness visit with primary care office  Here is a list of your preventive care/health maintenance measures and the plan for each if any are due:  Health Maintenance  Topic Date Due   DTaP/Tdap/Td (1 - Tdap) Please obtain record to update   DEXA SCAN  Please call to scheduled   Zoster Vaccines- Shingrix (2 of 2) Please obtain record   COVID-19 Vaccine (8 - 2023-24 season) 10/31/2023 (Originally 11/08/2022)   Medicare Annual Wellness (AWV)  03/14/2024   Pneumonia Vaccine 43+ Years old  Completed   INFLUENZA VACCINE  Completed   HPV VACCINES  Aged Out    -See a dentist at least yearly  -Get your eyes checked and then per your eye specialist's recommendations  -Other issues addressed today: -Please complete advanced directives and bring a Copy to your primary care provider. Thank you.   -I have included below further information regarding a healthy whole foods based diet, physical activity guidelines for adults, stress management and opportunities for social connections. I hope you find this information useful.   -----------------------------------------------------------------------------------------------------------------------------------------------------------------------------------------------------------------------------------------------------------  NUTRITION: -eat real food: lots of colorful vegetables (half the plate) and fruits -5-7 servings of vegetables and fruits per day (fresh or steamed is best), exp. 2 servings of vegetables with lunch and dinner and 2 servings of fruit per day. Berries and greens such as kale and collards are great choices.   -consume on a regular basis: whole grains (make sure first ingredient on label contains the word "whole"), fresh fruits, fish, nuts, seeds, healthy oils (such as olive oil, avocado oil, grape seed oil) -may eat small amounts of dairy and lean meat on occasion, but avoid processed meats such as ham, bacon, lunch meat, etc. -drink water -try to avoid fast food and pre-packaged foods, processed meat -most experts advise limiting sodium to < 2300mg  per day, should limit further is any chronic conditions such as high blood pressure, heart disease, diabetes, etc. The American Heart Association advised that < 1500mg  is is ideal -try to avoid foods that contain any ingredients with names you do not recognize  -try to avoid sugar/sweets (except for the natural sugar that occurs in fresh fruit) -try to avoid sweet drinks -try to avoid white rice, white bread, pasta (unless whole grain), white or yellow potatoes  EXERCISE GUIDELINES FOR ADULTS: -if you wish to increase your physical activity, do so gradually and with the approval of your doctor -STOP and seek medical care immediately if you have any chest pain, chest discomfort or trouble breathing when starting or increasing exercise  -move and stretch your body, legs, feet and arms when sitting for long periods -Physical activity guidelines for optimal health in adults: -least 150 minutes per week of aerobic exercise (can talk, but not sing) once approved by your doctor, 20-30 minutes of sustained activity or two 10 minute episodes of sustained activity every day.  -resistance training at least 2 days per week if approved by your doctor -balance exercises 3+ days per week:   Stand somewhere where you have something sturdy to hold onto if you lose balance.    1) lift up on toes, start with 5x per  day and work up to 20x   2) stand and lift on leg straight out to the side so that foot is a few inches of the floor, start with 5x each side and work up to 20x  each side   3) stand on one foot, start with 5 seconds each side and work up to 20 seconds on each side  If you need ideas or help with getting more active:  -Silver sneakers https://tools.silversneakers.com  -Walk with a Doc: http://stephens-thompson.biz/  -try to include resistance (weight lifting/strength building) and balance exercises twice per week: or the following link for ideas: ChessContest.fr  UpdateClothing.com.cy  STRESS MANAGEMENT: -can try meditating, or just sitting quietly with deep breathing while intentionally relaxing all parts of your body for 5 minutes daily -if you need further help with stress, anxiety or depression please follow up with your primary doctor or contact the wonderful folks at [place]: [place]: -options in [place] if you wish to engage in more social and exercise related activities:  -Silver sneakers https://tools.silversneakers.com  -Walk with a Doc: http://stephens-thompson.biz/  -Check out the [place] 50+ section on the [place] [employer] (hiking clubs, book clubs, cards and games, chess, exercise classes, aquatic classes and much more) - see the website for details: https://www.[place].gov/departments/parks-recreation/active-adults50  -YouTube has lots of exercise videos for different ages and abilities as well  -[place] (a variety of indoor and outdoor inperson activities for adults). (202)495-2322. [address].  -Virtual Online Classes (a variety of topics): see seniorplanet.org or call [phone]  -consider volunteering at a school, hospice center, church, senior center or elsewhere  ADVANCED HEALTHCARE DIRECTIVES:  Everyone should have advanced health care directives in place. This is so that you get the care you want, should you ever be in a  situation where you are unable to make your own medical decisions.   From the [place] Advanced Directive Website: "[place] are legal documents in which you give written instructions about your health care if, in the future, you cannot speak for yourself.   A health care power of attorney allows you to name a person you trust to make your health care decisions if you cannot make them yourself. A declaration of a desire for a natural death (or living will) is document, which states that you desire not to have your life prolonged by extraordinary measures if you have a terminal or incurable illness or if you are in a vegetative state. An advance instruction for mental health treatment makes a declaration of instructions, information and preferences regarding your mental health treatment. It also states that you are aware that the advance instruction authorizes a mental health treatment provider to act according to your wishes. It may also outline your consent or refusal of mental health treatment. A declaration of an anatomical gift allows anyone over the age of 73 to make a gift by will, organ donor card or other document."   Please see the following website or an elder law attorney for forms, FAQs and for completion of advanced directives: [place] [employer] [place] ([email or web address].no)  Or copy and paste the following to your web browser: [email or web address].cy

## 2023-04-21 ENCOUNTER — Other Ambulatory Visit: Payer: Self-pay | Admitting: Adult Health

## 2023-05-10 ENCOUNTER — Ambulatory Visit: Payer: Medicare Other | Admitting: Adult Health

## 2023-05-24 ENCOUNTER — Ambulatory Visit (INDEPENDENT_AMBULATORY_CARE_PROVIDER_SITE_OTHER): Payer: Medicare Other | Admitting: Adult Health

## 2023-05-24 ENCOUNTER — Encounter: Payer: Self-pay | Admitting: Adult Health

## 2023-05-24 VITALS — BP 140/62 | HR 60 | Temp 98.1°F | Ht 63.0 in | Wt 137.2 lb

## 2023-05-24 DIAGNOSIS — I10 Essential (primary) hypertension: Secondary | ICD-10-CM | POA: Diagnosis not present

## 2023-05-24 DIAGNOSIS — R7309 Other abnormal glucose: Secondary | ICD-10-CM

## 2023-05-24 LAB — POCT GLYCOSYLATED HEMOGLOBIN (HGB A1C): Hemoglobin A1C: 5.7 % — AB (ref 4.0–5.6)

## 2023-05-24 NOTE — Progress Notes (Signed)
Subjective:    Patient ID: April [name], female    DOB: 06/05/1941, 82 y.o.   MRN: [id number]  HPI 82 year old female who  has a past medical history of AICD (automatic cardioverter/defibrillator) present, Arthritis, HTN (hypertension), Myocardial infarction (HCC), PONV (postoperative nausea and vomiting), and Ventricular fibrillation (HCC).  She presents to the office today for follow up regarding HTN and Prediabetes  Hypertension -managed with Coreg 25 mg twice daily, enalapril 10 mg daily, and HCTZ 25 mg daily.  Who is managed by cardiology.  She does take a potassium supplement to prevent hypocalcemia.  She checks her blood pressures at home and reports readings in the 120s over 80s.  She denies dizziness, lightheadedness, chest pain, or shortness of breath. BP goes up in the office setting.  BP Readings from Last 3 Encounters:  05/24/23 (!) 140/62  01/19/23 120/80  09/02/22 (!) 140/60    Prediabetes - her A1c 5 months ago was 6.3. She was started on Metformin 500 mg daily. She has been tolerating this medication well. No signs or symptoms of hypoglycemia   Wt Readings from Last 3 Encounters:  05/24/23 137 lb 3.2 oz (62.2 kg)  01/19/23 139 lb (63 kg)  09/02/22 139 lb (63 kg)    Review of Systems See HPI   Past Medical History:  Diagnosis Date   AICD (automatic cardioverter/defibrillator) present    Arthritis    HTN (hypertension)    Myocardial infarction (HCC)    mild Mi 2000   PONV (postoperative nausea and vomiting)    Ventricular fibrillation (HCC)     Social History   Socioeconomic History   Marital status: Widowed    Spouse name: Not on file   Number of children: Not on file   Years of education: Not on file   Highest education level: Not on file  Occupational History   Not on file  Tobacco Use   Smoking status: Former    Types: Cigarettes    Quit date: 10/27/1999    Years since quitting: 23.5   Smokeless tobacco: Never  Vaping Use   Vaping Use: Never  used  Substance and Sexual Activity   Alcohol use: No   Drug use: No   Sexual activity: Not on file  Other Topics Concern   Not on file  Social History Narrative   Not on file   Social Determinants of Health   Financial Resource Strain: Not on file  Food Insecurity: Not on file  Transportation Needs: Not on file  Physical Activity: Not on file  Stress: Not on file  Social Connections: Not on file  Intimate Partner Violence: Not on file    Past Surgical History:  Procedure Laterality Date   IMPLANTABLE CARDIOVERTER DEFIBRILLATOR (ICD) GENERATOR CHANGE N/A 10/14/2014   Procedure: ICD GENERATOR CHANGE;  Surgeon: Marinus Maw, MD;  Location[facility] C[facility] LAB;  Service: Cardiovascular;  Laterality: N/A;   IMPLANTABLE CARDIOVERTER DEFIBRILLATOR IMPLANT  2000; 10-14-2014   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; generator change 09-2014 by Dr Ladona Ridgel   TOTAL HIP ARTHROPLASTY Left 01/28/2017   Procedure: LEFT TOTAL HIP ARTHROPLASTY ANTERIOR APPROACH;  Surgeon: Kathryne Hitch, MD;  Location: [facility] ORS;  Service: Orthopedics;  Laterality: Left;   TOTAL HIP ARTHROPLASTY Right 11/11/2017   Procedure: RIGHT TOTAL HIP ARTHROPLASTY ANTERIOR APPROACH;  Surgeon: Kathryne Hitch, MD;  Location: [facility] ORS;  Service: Orthopedics;  Laterality: Right;    Family History  Problem Relation Age of  Onset   Heart attack Mother    Heart attack Father        x2   Diabetes Maternal Aunt    Diabetes Maternal Grandmother    Heart Problems Maternal Grandmother     Allergies  Allergen Reactions   Codeine Nausea And Vomiting   Penicillins Hives, Rash and Other (See Comments)    As a child Has patient had a PCN reaction causing immediate rash, facial/tongue/throat swelling, SOB or lightheadedness with hypotension:unsure Has patient had a PCN reaction causing severe rash involving mucus membranes or skin necrosis:unsure Has patient had a PCN reaction that required hospitalization:No Has  patient had a PCN reaction occurring within the last 10 years:No If all of the above answers are "NO", then may proceed with Cephalosporin use.     Current Outpatient Medications on File Prior to Visit  Medication Sig Dispense Refill   acetaminophen (TYLENOL) 500 MG tablet Take 500-1,000 mg every 6 (six) hours as needed by mouth for moderate pain or headache.      aspirin EC 81 MG tablet Take 81 mg by mouth daily.     carvedilol (COREG) 25 MG tablet Take 2 tablets (50 mg total) by mouth 2 (two) times daily with a meal. 360 tablet 3   diphenhydrAMINE (BENADRYL) 25 MG tablet Take 50 mg by mouth at bedtime as needed for allergies.     enalapril (VASOTEC) 10 MG tablet Take 1 tablet (10 mg total) by mouth 2 (two) times daily. 180 tablet 3   hydrochlorothiazide (HYDRODIURIL) 25 MG tablet Take 1 tablet (25 mg total) by mouth daily. 90 tablet 3   metFORMIN (GLUCOPHAGE) 500 MG tablet TAKE 1 TABLET BY MOUTH DAILY 100 tablet 2   Omega-3 Fatty Acids (FISH OIL) 1000 MG CAPS Take 2 capsules (2,000 mg total) by mouth daily. 180 capsule 3   potassium chloride SA (KLOR-CON M) 20 MEQ tablet Take 1 tablet (20 mEq total) by mouth daily. 90 tablet 3   rosuvastatin (CRESTOR) 10 MG tablet Take 1 tablet (10 mg total) by mouth daily. 90 tablet 3   No current facility-administered medications on file prior to visit.    BP (!) 140/62 (BP Location: Left Arm, Patient Position: Sitting, Cuff Size: Normal)   Pulse 60   Temp 98.1 F (36.7 C) (Oral)   Ht 5\' 3"  (1.6 m)   Wt 137 lb 3.2 oz (62.2 kg)   SpO2 96%   BMI 24.30 kg/m       Objective:   Physical Exam Vitals and nursing note reviewed.  Constitutional:      Appearance: Normal appearance.  Cardiovascular:     Rate and Rhythm: Normal rate and regular rhythm.     Pulses: Normal pulses.     Heart sounds: Normal heart sounds.  Pulmonary:     Effort: Pulmonary effort is normal.     Breath sounds: Normal breath sounds.  Musculoskeletal:        General:  Normal range of motion.  Skin:    General: Skin is warm and dry.  Neurological:     General: No focal deficit present.     Mental Status: She is alert and oriented to person, place, and time.  Psychiatric:        Mood and Affect: Mood normal.        Behavior: Behavior normal.        Thought Content: Thought content normal.        Judgment: Judgment normal.  Assessment & Plan:  1. Essential hypertension - Controlled.  - No change in medication   2. Elevated glucose  - POC HgB A1c- 5.7 - has improved  - No change in therapy  - Follow up in January at [facility], NP

## 2023-06-07 ENCOUNTER — Ambulatory Visit (INDEPENDENT_AMBULATORY_CARE_PROVIDER_SITE_OTHER): Payer: Medicare Other

## 2023-06-07 DIAGNOSIS — I429 Cardiomyopathy, unspecified: Secondary | ICD-10-CM

## 2023-06-09 ENCOUNTER — Encounter: Payer: Self-pay | Admitting: Internal Medicine

## 2023-06-09 ENCOUNTER — Telehealth: Payer: Self-pay

## 2023-06-09 LAB — CUP PACEART REMOTE DEVICE CHECK
Battery Remaining Longevity: 30 mo
Battery Voltage: 2.94 V
Brady Statistic RV Percent Paced: 0.02 %
Date Time Interrogation Session: 20240613111753
HighPow Impedance: 46 Ohm
HighPow Impedance: 58 Ohm
Implantable Lead Connection Status: 753985
Implantable Lead Implant Date: 20000821
Implantable Lead Location: 753860
Implantable Lead Model: 6942
Implantable Pulse Generator Implant Date: 20151019
Lead Channel Impedance Value: 399 Ohm
Lead Channel Impedance Value: 399 Ohm
Lead Channel Pacing Threshold Amplitude: 0.5 V
Lead Channel Pacing Threshold Pulse Width: 0.4 ms
Lead Channel Sensing Intrinsic Amplitude: 4.5 mV
Lead Channel Sensing Intrinsic Amplitude: 4.5 mV
Lead Channel Setting Pacing Amplitude: 2 V
Lead Channel Setting Pacing Pulse Width: 0.4 ms
Lead Channel Setting Sensing Sensitivity: 0.3 mV

## 2023-06-09 NOTE — Telephone Encounter (Signed)
Error

## 2023-06-09 NOTE — Telephone Encounter (Signed)
Pt called to get help with her monitor. I called Med[employer] for additional help.

## 2023-07-05 NOTE — Progress Notes (Signed)
Remote ICD transmission.   

## 2023-07-11 ENCOUNTER — Other Ambulatory Visit: Payer: Self-pay | Admitting: Internal Medicine

## 2023-07-11 DIAGNOSIS — Z95810 Presence of automatic (implantable) cardiac defibrillator: Secondary | ICD-10-CM

## 2023-08-16 ENCOUNTER — Ambulatory Visit: Payer: Medicare Other | Attending: Internal Medicine | Admitting: Internal Medicine

## 2023-08-16 ENCOUNTER — Encounter: Payer: Self-pay | Admitting: Internal Medicine

## 2023-08-16 VITALS — BP 152/70 | HR 65 | Ht 63.0 in | Wt 138.8 lb

## 2023-08-16 DIAGNOSIS — Z95810 Presence of automatic (implantable) cardiac defibrillator: Secondary | ICD-10-CM

## 2023-08-16 DIAGNOSIS — I429 Cardiomyopathy, unspecified: Secondary | ICD-10-CM | POA: Diagnosis not present

## 2023-08-16 LAB — CUP PACEART INCLINIC DEVICE CHECK
Date Time Interrogation Session: 20240820173807
Implantable Lead Connection Status: 753985
Implantable Lead Implant Date: 20000821
Implantable Lead Location: 753860
Implantable Lead Model: 6942
Implantable Pulse Generator Implant Date: 20151019

## 2023-08-16 MED ORDER — ENALAPRIL MALEATE 10 MG PO TABS
10.0000 mg | ORAL_TABLET | Freq: Two times a day (BID) | ORAL | 0 refills | Status: DC
Start: 2023-08-16 — End: 2023-10-17

## 2023-08-16 MED ORDER — ROSUVASTATIN CALCIUM 10 MG PO TABS: 10.0000 mg | ORAL_TABLET | Freq: Every day | ORAL | 0 refills | Status: DC

## 2023-08-16 MED ORDER — POTASSIUM CHLORIDE CRYS ER 20 MEQ PO TBCR: 20.0000 meq | EXTENDED_RELEASE_TABLET | Freq: Every day | ORAL | 0 refills | Status: DC

## 2023-08-16 MED ORDER — HYDROCHLOROTHIAZIDE 25 MG PO TABS: 25.0000 mg | ORAL_TABLET | Freq: Every day | ORAL | 0 refills | Status: DC

## 2023-08-16 MED ORDER — CARVEDILOL 25 MG PO TABS
50.0000 mg | ORAL_TABLET | Freq: Two times a day (BID) | ORAL | 0 refills | Status: DC
Start: 2023-08-16 — End: 2023-08-24

## 2023-08-16 NOTE — Patient Instructions (Signed)
 Medication Instructions:  Your physician recommends that you continue on your current medications as directed. Please refer to the Current Medication list given to you today.  *If you need a refill on your cardiac medications before your next appointment, please call your pharmacy*  Lab Work: None ordered.  If you have labs (blood work) drawn today and your tests are completely normal, you will receive your results only by: MyChart Message (if you have MyChart) OR A paper copy in the mail If you have any lab test that is abnormal or we need to change your treatment, we will call you to review the results.  Testing/Procedures: None ordered.  Follow-Up: At [facility], you and your health needs are our priority.  As part of our continuing mission to provide you with exceptional heart care, we have created designated Provider Care Teams.  These Care Teams include your primary Cardiologist (physician) and Advanced Practice Providers (APPs -  Physician Assistants and Nurse Practitioners) who all work together to provide you with the care you need, when you need it.  We recommend signing up for the patient portal called "MyChart".  Sign up information is provided on this After Visit Summary.  MyChart is used to connect with patients for Virtual Visits (Telemedicine).  Patients are able to view lab/test results, encounter notes, upcoming appointments, etc.  Non-urgent messages can be sent to your provider as well.   To learn more about what you can do with MyChart, go to [email or web address].    Your next appointment:   1 year(s)  The format for your next appointment:   In Person  Provider:   Lewayne Bunting, MD{or one of the following Advanced Practice Providers on your designated Care Team:   Francis Dowse, [place] Casimiro Needle "Mardelle Matte" [place], [place] Earnest Rosier, NP   Important Information About Sugar

## 2023-08-16 NOTE — Progress Notes (Signed)
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 She has done well in the interim. I have not seen her since the Covid pandemic began. She remains active but notes that she retired earlier this year. She denies chest pain or sob. She notes that at home her bp is controlled. She has not had any ICD therapies.     Allergies  Allergen Reactions   Codeine Nausea And Vomiting   Penicillins Hives, Rash and Other (See Comments)    As a child Has patient had a PCN reaction causing immediate rash, facial/tongue/throat swelling, SOB or lightheadedness with hypotension:unsure Has patient had a PCN reaction causing severe rash involving mucus membranes or skin necrosis:unsure Has patient had a PCN reaction that required hospitalization:No Has patient had a PCN reaction occurring within the last 10 years:No If all of the above answers are "NO", then may proceed with Cephalosporin use.      Current Outpatient Medications  Medication Sig Dispense Refill   acetaminophen (TYLENOL) 500 MG tablet Take 500-1,000 mg every 6 (six) hours as needed by mouth for moderate pain or headache.      aspirin EC 81 MG tablet Take 81 mg by mouth daily.     diphenhydrAMINE (BENADRYL) 25 MG tablet Take 50 mg by mouth at bedtime as needed for allergies.     metFORMIN (GLUCOPHAGE) 500 MG tablet TAKE 1 TABLET BY MOUTH DAILY 100 tablet 2   Omega-3 Fatty Acids (FISH OIL) 1000 MG CAPS Take 2 capsules (2,000 mg total) by mouth daily. 180 capsule 3   carvedilol (COREG) 25 MG tablet Take 2 tablets (50 mg total) by mouth 2 (two) times daily with a meal. 180 tablet 0   enalapril (VASOTEC) 10 MG tablet Take 1 tablet (10 mg total) by mouth 2 (two) times daily. 180 tablet 0   hydrochlorothiazide (HYDRODIURIL) 25 MG tablet Take 1 tablet (25 mg total) by mouth daily. 90 tablet 0   potassium chloride SA (KLOR-CON M) 20 MEQ tablet Take 1  tablet (20 mEq total) by mouth daily. 90 tablet 0   rosuvastatin (CRESTOR) 10 MG tablet Take 1 tablet (10 mg total) by mouth daily. 90 tablet 0   No current facility-administered medications for this visit.     Past Medical History:  Diagnosis Date   AICD (automatic cardioverter/defibrillator) present    Arthritis    HTN (hypertension)    Myocardial infarction (HCC)    mild Mi 2000   PONV (postoperative nausea and vomiting)    Ventricular fibrillation (HCC)     ROS:   All systems reviewed and negative except as noted in the HPI.   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; generator change 09-2014 by Dr Ladona Ridgel   TOTAL HIP ARTHROPLASTY Left 01/28/2017   Procedure: LEFT TOTAL HIP ARTHROPLASTY ANTERIOR APPROACH;  Surgeon: Kathryne Hitch, MD;  Location: [facility] ORS;  Service: Orthopedics;  Laterality: Left;   TOTAL HIP ARTHROPLASTY Right 11/11/2017   Procedure: RIGHT TOTAL HIP ARTHROPLASTY ANTERIOR APPROACH;  Surgeon: Kathryne Hitch, MD;  Location: [facility] ORS;  Service:  Orthopedics;  Laterality: Right;     Family History  Problem Relation Age of Onset   Heart attack Mother    Heart attack Father        x2   Diabetes Maternal Aunt    Diabetes Maternal Grandmother    Heart Problems Maternal Grandmother      Social History   Socioeconomic History   Marital status: Widowed    Spouse name: Not on file   Number of children: Not on file   Years of education: Not on file   Highest education level: Not on file  Occupational History   Not on file  Tobacco Use   Smoking status: Former    Current packs/day: 0.00    Types: Cigarettes     Quit date: 10/27/1999    Years since quitting: 23.8   Smokeless tobacco: Never  Vaping Use   Vaping status: Never Used  Substance and Sexual Activity   Alcohol use: No   Drug use: No   Sexual activity: Not on file  Other Topics Concern   Not on file  Social History Narrative   Not on file   Social Determinants of Health   Financial Resource Strain: Not on file  Food Insecurity: Not on file  Transportation Needs: Not on file  Physical Activity: Not on file  Stress: Not on file  Social Connections: Not on file  Intimate Partner Violence: Not on file     BP (!) 152/70   Pulse 65   Ht 5\' 3"  (1.6 m)   Wt 138 lb 12.8 oz (63 kg)   SpO2 99%   BMI 24.59 kg/m   Physical Exam:  Well appearing NAD HEENT: Unremarkable Neck:  No JVD, no thyromegally Lymphatics:  No adenopathy Back:  No CVA tenderness Lungs:  Clear with no wheezes HEART:  Regular rate rhythm, no murmurs, no rubs, no clicks Abd:  soft, positive bowel sounds, no organomegally, no rebound, no guarding Ext:  2 plus pulses, no edema, no cyanosis, no clubbing Skin:  No rashes no nodules Neuro:  CN II through XII intact, motor grossly intact  EKG - nsr with PVC's  DEVICE  Normal device function.  See PaceArt for details.   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 No change in medical therapy. As she is coming up on 25 years of having an ICD with no therapy, I would be inclined not to recommend another ICD if she has no arrhythmias between now and time for another device in a couple of years. ICD - her medtronic single chamber ICD is working normally. No change in medical therapy. HTN - he bp is under better control. No change in her meds. She does not have much increase in her voltage on the 12 lead ECG. CAD - she denies anginal symptoms with no problems since her MI over 20 years ago.   April [name] April Alkhatib,MD

## 2023-08-19 ENCOUNTER — Other Ambulatory Visit: Payer: Self-pay | Admitting: Internal Medicine

## 2023-08-19 DIAGNOSIS — Z95810 Presence of automatic (implantable) cardiac defibrillator: Secondary | ICD-10-CM

## 2023-08-24 ENCOUNTER — Other Ambulatory Visit: Payer: Self-pay

## 2023-08-24 DIAGNOSIS — Z95810 Presence of automatic (implantable) cardiac defibrillator: Secondary | ICD-10-CM

## 2023-08-24 MED ORDER — CARVEDILOL 25 MG PO TABS
50.0000 mg | ORAL_TABLET | Freq: Two times a day (BID) | ORAL | 3 refills | Status: DC
Start: 2023-08-24 — End: 2024-04-17

## 2023-08-24 NOTE — Telephone Encounter (Signed)
Pt's medication was sent to pt's pharmacy as requested. Confirmation received.  °

## 2023-09-06 ENCOUNTER — Ambulatory Visit (INDEPENDENT_AMBULATORY_CARE_PROVIDER_SITE_OTHER): Payer: Medicare Other

## 2023-09-06 DIAGNOSIS — I429 Cardiomyopathy, unspecified: Secondary | ICD-10-CM

## 2023-09-06 DIAGNOSIS — I4901 Ventricular fibrillation: Secondary | ICD-10-CM

## 2023-09-06 LAB — CUP PACEART REMOTE DEVICE CHECK
Battery Remaining Longevity: 28 mo
Battery Voltage: 2.95 V
Brady Statistic RV Percent Paced: 0.02 %
Date Time Interrogation Session: 20240910022723
HighPow Impedance: 44 Ohm
HighPow Impedance: 56 Ohm
Implantable Lead Connection Status: 753985
Implantable Lead Implant Date: 20000821
Implantable Lead Location: 753860
Implantable Lead Model: 6942
Implantable Pulse Generator Implant Date: 20151019
Lead Channel Impedance Value: 399 Ohm
Lead Channel Impedance Value: 399 Ohm
Lead Channel Pacing Threshold Amplitude: 0.5 V
Lead Channel Pacing Threshold Pulse Width: 0.4 ms
Lead Channel Sensing Intrinsic Amplitude: 4 mV
Lead Channel Sensing Intrinsic Amplitude: 4 mV
Lead Channel Setting Pacing Amplitude: 2 V
Lead Channel Setting Pacing Pulse Width: 0.4 ms
Lead Channel Setting Sensing Sensitivity: 0.3 mV

## 2023-09-22 NOTE — Progress Notes (Signed)
Remote ICD transmission.   

## 2023-10-17 ENCOUNTER — Other Ambulatory Visit: Payer: Self-pay | Admitting: Internal Medicine

## 2023-10-17 DIAGNOSIS — Z95810 Presence of automatic (implantable) cardiac defibrillator: Secondary | ICD-10-CM

## 2023-12-06 ENCOUNTER — Ambulatory Visit (INDEPENDENT_AMBULATORY_CARE_PROVIDER_SITE_OTHER): Payer: 59

## 2023-12-06 DIAGNOSIS — I429 Cardiomyopathy, unspecified: Secondary | ICD-10-CM

## 2023-12-06 DIAGNOSIS — I4901 Ventricular fibrillation: Secondary | ICD-10-CM

## 2023-12-08 LAB — CUP PACEART REMOTE DEVICE CHECK
Battery Remaining Longevity: 27 mo
Battery Voltage: 2.94 V
Brady Statistic RV Percent Paced: 0.03 %
Date Time Interrogation Session: 20241211194052
HighPow Impedance: 45 Ohm
HighPow Impedance: 56 Ohm
Implantable Lead Connection Status: 753985
Implantable Lead Implant Date: 20000821
Implantable Lead Location: 753860
Implantable Lead Model: 6942
Implantable Pulse Generator Implant Date: 20151019
Lead Channel Impedance Value: 399 Ohm
Lead Channel Impedance Value: 399 Ohm
Lead Channel Pacing Threshold Amplitude: 0.5 V
Lead Channel Pacing Threshold Pulse Width: 0.4 ms
Lead Channel Sensing Intrinsic Amplitude: 5.125 mV
Lead Channel Sensing Intrinsic Amplitude: 5.125 mV
Lead Channel Setting Pacing Amplitude: 2 V
Lead Channel Setting Pacing Pulse Width: 0.4 ms
Lead Channel Setting Sensing Sensitivity: 0.3 mV

## 2024-01-13 NOTE — Progress Notes (Signed)
Remote ICD transmission.   

## 2024-01-13 NOTE — Addendum Note (Signed)
Addended by: Geralyn Flash D on: 01/13/2024 01:39 PM   Modules accepted: Orders

## 2024-01-18 ENCOUNTER — Other Ambulatory Visit: Payer: Self-pay | Admitting: Adult Health

## 2024-02-07 ENCOUNTER — Encounter: Payer: Medicare Other | Admitting: Adult Health

## 2024-02-07 ENCOUNTER — Ambulatory Visit: Payer: Medicare Other

## 2024-03-06 ENCOUNTER — Ambulatory Visit (INDEPENDENT_AMBULATORY_CARE_PROVIDER_SITE_OTHER): Payer: 59

## 2024-03-06 DIAGNOSIS — I429 Cardiomyopathy, unspecified: Secondary | ICD-10-CM

## 2024-03-06 LAB — CUP PACEART REMOTE DEVICE CHECK
Battery Remaining Longevity: 23 mo
Battery Voltage: 2.94 V
Brady Statistic RV Percent Paced: 0.02 %
Date Time Interrogation Session: 20250311043724
HighPow Impedance: 43 Ohm
HighPow Impedance: 53 Ohm
Implantable Lead Connection Status: 753985
Implantable Lead Implant Date: 20000821
Implantable Lead Location: 753860
Implantable Lead Model: 6942
Implantable Pulse Generator Implant Date: 20151019
Lead Channel Impedance Value: 399 Ohm
Lead Channel Impedance Value: 399 Ohm
Lead Channel Pacing Threshold Amplitude: 0.5 V
Lead Channel Pacing Threshold Pulse Width: 0.4 ms
Lead Channel Sensing Intrinsic Amplitude: 1.875 mV
Lead Channel Sensing Intrinsic Amplitude: 1.875 mV
Lead Channel Setting Pacing Amplitude: 2 V
Lead Channel Setting Pacing Pulse Width: 0.4 ms
Lead Channel Setting Sensing Sensitivity: 0.3 mV

## 2024-03-09 ENCOUNTER — Ambulatory Visit (INDEPENDENT_AMBULATORY_CARE_PROVIDER_SITE_OTHER): Payer: Medicare Other | Admitting: Adult Health

## 2024-03-09 VITALS — BP 130/62 | HR 56 | Temp 97.9°F | Ht 63.0 in | Wt 135.0 lb

## 2024-03-09 DIAGNOSIS — R7303 Prediabetes: Secondary | ICD-10-CM | POA: Diagnosis not present

## 2024-03-09 DIAGNOSIS — I10 Essential (primary) hypertension: Secondary | ICD-10-CM

## 2024-03-09 DIAGNOSIS — Z0000 Encounter for general adult medical examination without abnormal findings: Secondary | ICD-10-CM

## 2024-03-09 DIAGNOSIS — I4901 Ventricular fibrillation: Secondary | ICD-10-CM | POA: Diagnosis not present

## 2024-03-09 DIAGNOSIS — E785 Hyperlipidemia, unspecified: Secondary | ICD-10-CM | POA: Diagnosis not present

## 2024-03-09 DIAGNOSIS — Z95810 Presence of automatic (implantable) cardiac defibrillator: Secondary | ICD-10-CM

## 2024-03-09 LAB — CBC WITH DIFFERENTIAL/PLATELET
Basophils Absolute: 0 10*3/uL (ref 0.0–0.1)
Basophils Relative: 0.8 % (ref 0.0–3.0)
Eosinophils Absolute: 0.1 10*3/uL (ref 0.0–0.7)
Eosinophils Relative: 1.4 % (ref 0.0–5.0)
HCT: 41.2 % (ref 36.0–46.0)
Hemoglobin: 13.3 g/dL (ref 12.0–15.0)
Lymphocytes Relative: 19.1 % (ref 12.0–46.0)
Lymphs Abs: 1.1 10*3/uL (ref 0.7–4.0)
MCHC: 32.3 g/dL (ref 30.0–36.0)
MCV: 88.5 fl (ref 78.0–100.0)
Monocytes Absolute: 0.4 10*3/uL (ref 0.1–1.0)
Monocytes Relative: 6.8 % (ref 3.0–12.0)
Neutro Abs: 4.1 10*3/uL (ref 1.4–7.7)
Neutrophils Relative %: 71.9 % (ref 43.0–77.0)
Platelets: 160 10*3/uL (ref 150.0–400.0)
RBC: 4.65 Mil/uL (ref 3.87–5.11)
RDW: 15.9 % — ABNORMAL HIGH (ref 11.5–15.5)
WBC: 5.8 10*3/uL (ref 4.0–10.5)

## 2024-03-09 LAB — COMPREHENSIVE METABOLIC PANEL
ALT: 16 U/L (ref 0–35)
AST: 22 U/L (ref 0–37)
Albumin: 4.8 g/dL (ref 3.5–5.2)
Alkaline Phosphatase: 60 U/L (ref 39–117)
BUN: 35 mg/dL — ABNORMAL HIGH (ref 6–23)
CO2: 23 meq/L (ref 19–32)
Calcium: 9.9 mg/dL (ref 8.4–10.5)
Chloride: 103 meq/L (ref 96–112)
Creatinine, Ser: 0.95 mg/dL (ref 0.40–1.20)
GFR: 55.82 mL/min — ABNORMAL LOW (ref >60.00)
Glucose, Bld: 114 mg/dL — ABNORMAL HIGH (ref 70–99)
Potassium: 4.9 meq/L (ref 3.5–5.1)
Sodium: 137 meq/L (ref 135–145)
Total Bilirubin: 0.5 mg/dL (ref 0.2–1.2)
Total Protein: 7.9 g/dL (ref 6.0–8.3)

## 2024-03-09 LAB — LIPID PANEL
Cholesterol: 102 mg/dL (ref 0–200)
HDL: 39.3 mg/dL (ref >39.00)
LDL Cholesterol: 32 mg/dL (ref 0–99)
NonHDL: 62.4
Total CHOL/HDL Ratio: 3
Triglycerides: 151 mg/dL — ABNORMAL HIGH (ref 0.0–149.0)
VLDL: 30.2 mg/dL (ref 0.0–40.0)

## 2024-03-09 LAB — TSH: TSH: 1.42 u[IU]/mL (ref 0.35–5.50)

## 2024-03-09 LAB — HEMOGLOBIN A1C: Hgb A1c MFr Bld: 6.3 % (ref 4.6–6.5)

## 2024-03-09 NOTE — Patient Instructions (Addendum)
 It was great seeing you today   We will follow up with you regarding your lab work   Please let me know if you need anything

## 2024-03-09 NOTE — Progress Notes (Signed)
 Subjective:    Patient ID: April [name], female    DOB: August 14, 1941, 83 y.o.   MRN: [id number]  HPI Patient presents for yearly preventative medicine examination. She is a pleasant and remarkable 83 year old female who  has a past medical history of AICD (automatic cardioverter/defibrillator) present, Arthritis, HTN (hypertension), Myocardial infarction (HCC), PONV (postoperative nausea and vomiting), and Ventricular fibrillation (HCC).  Hypertension -managed with Coreg 25 mg twice daily, enalapril 10 mg daily, and HCTZ 25 mg daily.  Who is managed by cardiology.  She does take a potassium supplement to prevent hypocalcemia.  She checks her blood pressures at home and reports readings in the 120s over 80s.  She denies dizziness, lightheadedness, chest pain, or shortness of breath. BP goes up in the office setting.   BP Readings from Last 3 Encounters:  03/09/24 130/62  08/16/23 (!) 152/70  05/24/23 (!) 140/62   Prediabetes - . Managed with  Metformin 500 mg daily. She has been tolerating this medication well. No signs or symptoms of hypoglycemia  Lab Results  Component Value Date   HGBA1C 5.7 (A) 05/24/2023   HGBA1C 6.3 01/19/2023   Ventricular Fibrillation - history of V/F arrest in 2000.  She has an ICD inserted.  Has not had any ICD incidents. Denies chest pain or shortness of breath  Hyperlipidemia /CAD - MI 20 years ago.  Currently managed with Crestor 10 mg, ASA 81 and Omega 3 Lab Results  Component Value Date   CHOL 102 01/19/2023   HDL 38.50 (L) 01/19/2023   LDLCALC 39 01/19/2023   TRIG 122.0 01/19/2023   CHOLHDL 3 01/19/2023    All immunizations and health maintenance protocols were reviewed with the patient and needed orders were placed.  Appropriate screening laboratory values were ordered for the patient including screening of hyperlipidemia, renal function and hepatic function.   Medication reconciliation,  past medical history, social history, problem list and  allergies were reviewed in detail with the patient  Goals were established with regard to weight loss, exercise, and  diet in compliance with medications. She is walking about 4-6 miles a day. She is eating healthy.   She does not want to have bone density screen.   Review of Systems  Constitutional: Negative.   HENT: Negative.    Eyes: Negative.   Respiratory: Negative.    Cardiovascular: Negative.   Gastrointestinal: Negative.   Endocrine: Negative.   Genitourinary: Negative.   Musculoskeletal: Negative.   Skin: Negative.   Allergic/Immunologic: Negative.   Neurological: Negative.   Hematological: Negative.   Psychiatric/Behavioral: Negative.     Past Medical History:  Diagnosis Date   AICD (automatic cardioverter/defibrillator) present    Arthritis    HTN (hypertension)    Myocardial infarction (HCC)    mild Mi 2000   PONV (postoperative nausea and vomiting)    Ventricular fibrillation (HCC)     Social History   Socioeconomic History   Marital status: Widowed    Spouse name: Not on file   Number of children: Not on file   Years of education: Not on file   Highest education level: Not on file  Occupational History   Not on file  Tobacco Use   Smoking status: Former    Current packs/day: 0.00    Types: Cigarettes    Quit date: 10/27/1999    Years since quitting: 24.3   Smokeless tobacco: Never  Vaping Use   Vaping status: Never Used  Substance and Sexual Activity  Alcohol use: No   Drug use: No   Sexual activity: Not on file  Other Topics Concern   Not on file  Social History Narrative   Not on file   Social Drivers of Health   Financial Resource Strain: Not on file  Food Insecurity: Not on file  Transportation Needs: Not on file  Physical Activity: Not on file  Stress: Not on file  Social Connections: Not on file  Intimate Partner Violence: Not on file    Past Surgical History:  Procedure Laterality Date   IMPLANTABLE CARDIOVERTER  DEFIBRILLATOR (ICD) GENERATOR CHANGE N/A 10/14/2014   Procedure: ICD GENERATOR CHANGE;  Surgeon: Marinus Maw, MD;  Location: [facility] CATH LAB;  Service: Cardiovascular;  Laterality: N/A;   IMPLANTABLE CARDIOVERTER DEFIBRILLATOR IMPLANT  2000; 10-14-2014   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; generator change 09-2014 by Dr Ladona Ridgel   TOTAL HIP ARTHROPLASTY Left 01/28/2017   Procedure: LEFT TOTAL HIP ARTHROPLASTY ANTERIOR APPROACH;  Surgeon: Kathryne Hitch, MD;  Location: [facility];  Service: Orthopedics;  Laterality: Left;   TOTAL HIP ARTHROPLASTY Right 11/11/2017   Procedure: RIGHT TOTAL HIP ARTHROPLASTY ANTERIOR APPROACH;  Surgeon: Kathryne Hitch, MD;  Location: [facility];  Service: Orthopedics;  Laterality: Right;    Family History  Problem Relation Age of Onset   Heart attack Mother    Heart attack Father        x2   Diabetes Maternal Aunt    Diabetes Maternal Grandmother    Heart Problems Maternal Grandmother     Allergies  Allergen Reactions   Codeine Nausea And Vomiting   Penicillins Hives, Rash and Other (See Comments)    As a child Has patient had a PCN reaction causing immediate rash, facial/tongue/throat swelling, SOB or lightheadedness with hypotension:unsure Has patient had a PCN reaction causing severe rash involving mucus membranes or skin necrosis:unsure Has patient had a PCN reaction that required hospitalization:No Has patient had a PCN reaction occurring within the last 10 years:No If all of the above answers are "NO", then may proceed with Cephalosporin use.     Current Outpatient Medications on File Prior to Visit  Medication Sig Dispense Refill   acetaminophen (TYLENOL) 500 MG tablet Take 500-1,000 mg every 6 (six) hours as needed by mouth for moderate pain or headache.      aspirin EC 81 MG tablet Take 81 mg by mouth daily.     carvedilol (COREG) 25 MG tablet Take 2 tablets (50 mg total) by mouth 2 (two) times daily with a meal. 360  tablet 3   diphenhydrAMINE (BENADRYL) 25 MG tablet Take 50 mg by mouth at bedtime as needed for allergies.     enalapril (VASOTEC) 10 MG tablet TAKE 1 TABLET BY MOUTH TWICE  DAILY 180 tablet 3   hydrochlorothiazide (HYDRODIURIL) 25 MG tablet TAKE 1 TABLET BY MOUTH DAILY 90 tablet 3   metFORMIN (GLUCOPHAGE) 500 MG tablet TAKE 1 TABLET BY MOUTH DAILY 100 tablet 0   Omega-3 Fatty Acids (FISH OIL) 1000 MG CAPS Take 2 capsules (2,000 mg total) by mouth daily. 180 capsule 3   potassium chloride SA (KLOR-CON M) 20 MEQ tablet TAKE 1 TABLET BY MOUTH DAILY 90 tablet 3   rosuvastatin (CRESTOR) 10 MG tablet TAKE 1 TABLET BY MOUTH DAILY 90 tablet 3   No current facility-administered medications on file prior to visit.    BP 130/62   Pulse (!) 56   Temp 97.9 F (36.6 C) (Oral)  Ht 5\' 3"  (1.6 m)   Wt 135 lb (61.2 kg)   SpO2 97%   BMI 23.91 kg/m       Objective:   Physical Exam Vitals and nursing note reviewed.  Constitutional:      General: She is not in acute distress.    Appearance: Normal appearance. She is not ill-appearing.  HENT:     Head: Normocephalic and atraumatic.     Right Ear: Tympanic membrane, ear canal and external ear normal. There is no impacted cerumen.     Left Ear: Tympanic membrane, ear canal and external ear normal. There is no impacted cerumen.     Nose: Nose normal. No congestion or rhinorrhea.     Mouth/Throat:     Mouth: Mucous membranes are moist.     Pharynx: Oropharynx is clear.  Eyes:     Extraocular Movements: Extraocular movements intact.     Conjunctiva/sclera: Conjunctivae normal.     Pupils: Pupils are equal, round, and reactive to light.  Neck:     Vascular: No carotid bruit.  Cardiovascular:     Rate and Rhythm: Normal rate and regular rhythm.     Pulses: Normal pulses.     Heart sounds: No murmur heard.    No friction rub. No gallop.  Pulmonary:     Effort: Pulmonary effort is normal.     Breath sounds: Normal breath sounds.  Abdominal:      General: Abdomen is flat. Bowel sounds are normal. There is no distension.     Palpations: Abdomen is soft. There is no mass.     Tenderness: There is no abdominal tenderness. There is no guarding or rebound.     Hernia: No hernia is present.  Musculoskeletal:        General: Normal range of motion.     Cervical back: Normal range of motion and neck supple.  Lymphadenopathy:     Cervical: No cervical adenopathy.  Skin:    General: Skin is warm and dry.     Capillary Refill: Capillary refill takes less than 2 seconds.  Neurological:     General: No focal deficit present.     Mental Status: She is alert and oriented to person, place, and time.  Psychiatric:        Mood and Affect: Mood normal.        Behavior: Behavior normal.        Thought Content: Thought content normal.        Judgment: Judgment normal.       Assessment & Plan:  1. Routine general medical examination at a health care facility (Primary) Today patient counseled on age appropriate routine health concerns for screening and prevention, each reviewed and up to date or declined. Immunizations reviewed and up to date or declined. Labs ordered and reviewed. Risk factors for depression reviewed and negative. Hearing function and visual acuity are intact. ADLs screened and addressed as needed. Functional ability and level of safety reviewed and appropriate. Education, counseling and referrals performed based on assessed risks today. Patient provided with a copy of personalized plan for preventive services. - Continue to eat healthy and exercise - Follow up in one year or sooner if needed   2. Essential hypertension - Well controlled. No change in medications  - CBC with Differential/Platelet; Future - Comprehensive metabolic panel; Future - Lipid panel; Future - TSH; Future  3. Prediabetes - Consider increase in metformin  - CBC with Differential/Platelet; Future - Comprehensive metabolic panel;  Future - Lipid panel;  Future - TSH; Future - Hemoglobin A1c; Future  4. VENTRICULAR FIBRILLATION  - CBC with Differential/Platelet; Future - Comprehensive metabolic panel; Future - Lipid panel; Future - TSH; Future  5. AICD (automatic cardioverter/defibrillator) present  - CBC with Differential/Platelet; Future - Comprehensive metabolic panel; Future - Lipid panel; Future - TSH; Future  6. Hyperlipidemia, unspecified hyperlipidemia type - Consider increase in statin  - CBC with Differential/Platelet; Future - Comprehensive metabolic panel; Future - Lipid panel; Future - TSH; Future   Shirline Frees, NP

## 2024-04-11 ENCOUNTER — Other Ambulatory Visit: Payer: Self-pay | Admitting: Internal Medicine

## 2024-04-11 DIAGNOSIS — Z95810 Presence of automatic (implantable) cardiac defibrillator: Secondary | ICD-10-CM

## 2024-04-12 ENCOUNTER — Other Ambulatory Visit: Payer: Self-pay | Admitting: Adult Health

## 2024-04-15 ENCOUNTER — Other Ambulatory Visit: Payer: Self-pay | Admitting: Internal Medicine

## 2024-04-15 DIAGNOSIS — Z95810 Presence of automatic (implantable) cardiac defibrillator: Secondary | ICD-10-CM

## 2024-04-23 NOTE — Progress Notes (Signed)
 Remote ICD transmission.

## 2024-06-05 ENCOUNTER — Ambulatory Visit: Admitting: Family Medicine

## 2024-06-05 ENCOUNTER — Ambulatory Visit (INDEPENDENT_AMBULATORY_CARE_PROVIDER_SITE_OTHER): Payer: 59

## 2024-06-05 DIAGNOSIS — Z95810 Presence of automatic (implantable) cardiac defibrillator: Secondary | ICD-10-CM

## 2024-06-07 LAB — CUP PACEART REMOTE DEVICE CHECK
Battery Remaining Longevity: 22 mo
Battery Voltage: 2.92 V
Brady Statistic RV Percent Paced: 0.04 %
Date Time Interrogation Session: 20250611172517
HighPow Impedance: 43 Ohm
HighPow Impedance: 56 Ohm
Implantable Lead Connection Status: 753985
Implantable Lead Implant Date: 20000821
Implantable Lead Location: 753860
Implantable Lead Model: 6942
Implantable Pulse Generator Implant Date: 20151019
Lead Channel Impedance Value: 399 Ohm
Lead Channel Impedance Value: 399 Ohm
Lead Channel Pacing Threshold Amplitude: 0.375 V
Lead Channel Pacing Threshold Pulse Width: 0.4 ms
Lead Channel Sensing Intrinsic Amplitude: 1.375 mV
Lead Channel Sensing Intrinsic Amplitude: 1.375 mV
Lead Channel Setting Pacing Amplitude: 2 V
Lead Channel Setting Pacing Pulse Width: 0.4 ms
Lead Channel Setting Sensing Sensitivity: 0.3 mV

## 2024-06-10 ENCOUNTER — Ambulatory Visit: Payer: Self-pay | Admitting: Internal Medicine

## 2024-06-12 ENCOUNTER — Ambulatory Visit: Admitting: Family Medicine

## 2024-06-15 ENCOUNTER — Other Ambulatory Visit

## 2024-07-05 ENCOUNTER — Other Ambulatory Visit: Payer: Self-pay | Admitting: Internal Medicine

## 2024-07-05 DIAGNOSIS — Z95810 Presence of automatic (implantable) cardiac defibrillator: Secondary | ICD-10-CM

## 2024-08-08 NOTE — Progress Notes (Signed)
 Remote ICD transmission.

## 2024-08-08 NOTE — Addendum Note (Signed)
 Addended by: VICCI SELLER A on: 08/08/2024 11:41 AM   Modules accepted: Orders

## 2024-08-16 ENCOUNTER — Other Ambulatory Visit

## 2024-08-20 ENCOUNTER — Telehealth: Payer: Self-pay | Admitting: Internal Medicine

## 2024-08-20 DIAGNOSIS — Z95810 Presence of automatic (implantable) cardiac defibrillator: Secondary | ICD-10-CM

## 2024-08-20 MED ORDER — ENALAPRIL MALEATE 10 MG PO TABS: 10.0000 mg | ORAL_TABLET | Freq: Two times a day (BID) | ORAL | 0 refills | Status: DC

## 2024-08-20 MED ORDER — POTASSIUM CHLORIDE CRYS ER 20 MEQ PO TBCR: 20.0000 meq | EXTENDED_RELEASE_TABLET | Freq: Every day | ORAL | 0 refills | Status: DC

## 2024-08-20 MED ORDER — HYDROCHLOROTHIAZIDE 25 MG PO TABS: 25.0000 mg | ORAL_TABLET | Freq: Every day | ORAL | 0 refills | Status: DC

## 2024-08-20 MED ORDER — CARVEDILOL 25 MG PO TABS: 50.0000 mg | ORAL_TABLET | Freq: Two times a day (BID) | ORAL | 0 refills | Status: DC

## 2024-08-20 MED ORDER — ROSUVASTATIN CALCIUM 10 MG PO TABS: 10.0000 mg | ORAL_TABLET | Freq: Every day | ORAL | 0 refills | Status: DC

## 2024-08-20 NOTE — Telephone Encounter (Signed)
 Pt's medications were sent to pt's pharmacy as requested. Confirmation received.

## 2024-08-20 NOTE — Telephone Encounter (Signed)
*  STAT* If patient is at the pharmacy, call can be transferred to refill team.   1. Which medications need to be refilled? (please list name of each medication and dose if known) carvedilol  (COREG ) 25 MG tablet  enalapril  (VASOTEC ) 10 MG tablet  hydrochlorothiazide  (HYDRODIURIL ) 25 MG tablet  potassium chloride  SA (KLOR-CON  M) 20 MEQ tablet  rosuvastatin  (CRESTOR ) 10 MG tablet  2. Would you like to learn more about the convenience, safety, & potential cost savings by using the [facility]? No   3. Are you open to using the [facility] (Type [facility]. ) No   4. Which pharmacy/location (including street and city if local pharmacy) is medication to be sent to? [facility] - [place], [place] - [address] [place]    5. Do they need a 30 day or 90 day supply? 90 day  Pt has scheduled appt on 10/6

## 2024-08-22 ENCOUNTER — Ambulatory Visit: Admitting: Internal Medicine

## 2024-09-04 ENCOUNTER — Ambulatory Visit (INDEPENDENT_AMBULATORY_CARE_PROVIDER_SITE_OTHER): Payer: 59

## 2024-09-04 DIAGNOSIS — Z95810 Presence of automatic (implantable) cardiac defibrillator: Secondary | ICD-10-CM

## 2024-09-05 LAB — CUP PACEART REMOTE DEVICE CHECK
Battery Remaining Longevity: 18 mo
Battery Voltage: 2.94 V
Brady Statistic RV Percent Paced: 0.04 %
Date Time Interrogation Session: 20250909001705
HighPow Impedance: 43 Ohm
HighPow Impedance: 54 Ohm
Implantable Lead Connection Status: 753985
Implantable Lead Implant Date: 20000821
Implantable Lead Location: 753860
Implantable Lead Model: 6942
Implantable Pulse Generator Implant Date: 20151019
Lead Channel Impedance Value: 399 Ohm
Lead Channel Impedance Value: 399 Ohm
Lead Channel Pacing Threshold Amplitude: 0.375 V
Lead Channel Pacing Threshold Pulse Width: 0.4 ms
Lead Channel Sensing Intrinsic Amplitude: 1.625 mV
Lead Channel Sensing Intrinsic Amplitude: 1.625 mV
Lead Channel Setting Pacing Amplitude: 2 V
Lead Channel Setting Pacing Pulse Width: 0.4 ms
Lead Channel Setting Sensing Sensitivity: 0.3 mV

## 2024-09-11 ENCOUNTER — Ambulatory Visit: Payer: Self-pay | Admitting: Internal Medicine

## 2024-09-13 ENCOUNTER — Other Ambulatory Visit: Payer: Self-pay | Admitting: Adult Health

## 2024-09-13 ENCOUNTER — Telehealth: Payer: Self-pay | Admitting: *Deleted

## 2024-09-13 ENCOUNTER — Ambulatory Visit: Payer: Self-pay | Admitting: Adult Health

## 2024-09-13 ENCOUNTER — Other Ambulatory Visit (INDEPENDENT_AMBULATORY_CARE_PROVIDER_SITE_OTHER)

## 2024-09-13 DIAGNOSIS — R7303 Prediabetes: Secondary | ICD-10-CM

## 2024-09-13 LAB — HEMOGLOBIN A1C: Hgb A1c MFr Bld: 6.6 % — ABNORMAL HIGH (ref 4.6–6.5)

## 2024-09-13 NOTE — Progress Notes (Signed)
Remote ICD Transmission.

## 2024-09-13 NOTE — Telephone Encounter (Signed)
 Copied from CRM [id number]. Topic: General - Call Back - No Documentation >> Sep 13, 2024  4:17 PM April [name] wrote: Reason for CRM: Patient returned call - there's no documentation. Assuming it was regarding her lab results, relayed lab results to patient and she would like a call back to discuss her diet. She also needs order put in for 24m recheck of A1C

## 2024-10-01 ENCOUNTER — Ambulatory Visit: Attending: Internal Medicine | Admitting: Internal Medicine

## 2024-10-01 VITALS — BP 124/60 | HR 57 | Ht 63.5 in | Wt 138.0 lb

## 2024-10-01 DIAGNOSIS — I4901 Ventricular fibrillation: Secondary | ICD-10-CM

## 2024-10-01 DIAGNOSIS — I10 Essential (primary) hypertension: Secondary | ICD-10-CM | POA: Diagnosis not present

## 2024-10-01 DIAGNOSIS — Z95810 Presence of automatic (implantable) cardiac defibrillator: Secondary | ICD-10-CM | POA: Diagnosis not present

## 2024-10-01 LAB — CUP PACEART INCLINIC DEVICE CHECK
Date Time Interrogation Session: 20251006123006
HighPow Impedance: 43 Ohm
Implantable Lead Connection Status: 753985
Implantable Lead Implant Date: 20000821
Implantable Lead Location: 753860
Implantable Lead Model: 6942
Implantable Pulse Generator Implant Date: 20151019
Lead Channel Impedance Value: 399 Ohm
Lead Channel Pacing Threshold Amplitude: 0.375 V
Lead Channel Pacing Threshold Pulse Width: 0.4 ms
Lead Channel Sensing Intrinsic Amplitude: 1.6 mV

## 2024-10-01 NOTE — Patient Instructions (Signed)

## 2024-10-01 NOTE — Progress Notes (Signed)
 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 She has done well in the interim. When I saw her last we discussed the possibility of not replacing her ICD.  She remains active in retirement. She denies chest pain or sob. She notes that at home her bp is controlled. She has not had any ICD therapies. She is walking 4 miles most days. Allergies  Allergen Reactions   Codeine Nausea And Vomiting   Penicillins Hives, Rash and Other (See Comments)    As a child Has patient had a PCN reaction causing immediate rash, facial/tongue/throat swelling, SOB or lightheadedness with hypotension:unsure Has patient had a PCN reaction causing severe rash involving mucus membranes or skin necrosis:unsure Has patient had a PCN reaction that required hospitalization:No Has patient had a PCN reaction occurring within the last 10 years:No If all of the above answers are NO, then may proceed with Cephalosporin use.      Current Outpatient Medications  Medication Sig Dispense Refill   acetaminophen  (TYLENOL ) 500 MG tablet Take 500-1,000 mg every 6 (six) hours as needed by mouth for moderate pain or headache.      aspirin  EC 81 MG tablet Take 81 mg by mouth daily.     carvedilol  (COREG ) 25 MG tablet Take 2 tablets (50 mg total) by mouth 2 (two) times daily with a meal. 360 tablet 0   diphenhydrAMINE  (BENADRYL ) 25 MG tablet Take 50 mg by mouth at bedtime as needed for allergies.     enalapril  (VASOTEC ) 10 MG tablet Take 1 tablet (10 mg total) by mouth 2 (two) times daily. 200 tablet 0   hydrochlorothiazide  (HYDRODIURIL ) 25 MG tablet Take 1 tablet (25 mg total) by mouth daily. 100 tablet 0   metFORMIN  (GLUCOPHAGE ) 500 MG tablet TAKE 1 TABLET BY MOUTH DAILY 100 tablet 2   Omega-3 Fatty Acids (FISH OIL ) 1000 MG CAPS Take 2 capsules (2,000 mg total) by mouth daily. 180 capsule 3   potassium chloride  SA (KLOR-CON  M)  20 MEQ tablet Take 1 tablet (20 mEq total) by mouth daily. 100 tablet 0   rosuvastatin  (CRESTOR ) 10 MG tablet Take 1 tablet (10 mg total) by mouth daily. 100 tablet 0   No current facility-administered medications for this visit.     Past Medical History:  Diagnosis Date   AICD (automatic cardioverter/defibrillator) present    Arthritis    HTN (hypertension)    Myocardial infarction (HCC)    mild Mi 2000   PONV (postoperative nausea and vomiting)    Ventricular fibrillation (HCC)     ROS:   All systems reviewed and negative except as noted in the HPI.   Past Surgical History:  Procedure Laterality Date   IMPLANTABLE CARDIOVERTER DEFIBRILLATOR (ICD) GENERATOR CHANGE N/A 10/14/2014   Procedure: ICD GENERATOR CHANGE;  Surgeon: April LELON Birmingham, MD;  Location: [facility] CATH LAB;  Service: Cardiovascular;  Laterality: N/A;   IMPLANTABLE CARDIOVERTER DEFIBRILLATOR IMPLANT  2000; 10-14-2014   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; generator change 09-2014 by Dr Birmingham   TOTAL HIP ARTHROPLASTY Left 01/28/2017   Procedure: LEFT TOTAL HIP ARTHROPLASTY ANTERIOR APPROACH;  Surgeon: Lonni CINDERELLA Poli, MD;  Location: [facility];  Service: Orthopedics;  Laterality: Left;   TOTAL HIP ARTHROPLASTY Right 11/11/2017   Procedure: RIGHT TOTAL HIP ARTHROPLASTY ANTERIOR APPROACH;  Surgeon: Poli Lonni CINDERELLA, MD;  Location: [facility];  Service: Orthopedics;  Laterality: Right;     Family History  Problem Relation Age of Onset   Heart attack Mother    Heart attack Father        x2   Diabetes Maternal Aunt    Diabetes Maternal Grandmother    Heart Problems Maternal Grandmother      Social History   Socioeconomic History   Marital status: Widowed    Spouse name: Not on file   Number of children: Not on file   Years of education: Not on file   Highest education level: Not on file  Occupational History   Not on file  Tobacco Use   Smoking status: Former    Current packs/day:  0.00    Types: Cigarettes    Quit date: 10/27/1999    Years since quitting: 24.9   Smokeless tobacco: Never  Vaping Use   Vaping status: Never Used  Substance and Sexual Activity   Alcohol use: No   Drug use: No   Sexual activity: Not on file  Other Topics Concern   Not on file  Social History Narrative   Not on file   Social Drivers of Health   Financial Resource Strain: Not on file  Food Insecurity: Not on file  Transportation Needs: Not on file  Physical Activity: Not on file  Stress: Not on file  Social Connections: Not on file  Intimate Partner Violence: Not on file     BP 124/60   Pulse (!) 57   Ht 5' 3.5 (1.613 m)   Wt 138 lb (62.6 kg)   SpO2 97%   BMI 24.06 kg/m   Physical Exam:  Well appearing NAD HEENT: Unremarkable Neck:  No JVD, no thyromegally Lymphatics:  No adenopathy Back:  No CVA tenderness Lungs:  Clear with no wheezes HEART:  Regular rate rhythm, no murmurs, no rubs, no clicks Abd:  soft, positive bowel sounds, no organomegally, no rebound, no guarding Ext:  2 plus pulses, no edema, no cyanosis, no clubbing Skin:  No rashes no nodules Neuro:  CN II through XII intact, motor grossly intact  EKG - NSR with new RBBB  DEVICE  Normal device function.  See PaceArt for details.   Assess/Plan:  VF arrest - she has not had any ventricular arrhythmias since her last visit or since she has had an ICD. No change in medical therapy. As she is coming up on 25 years of having an ICD with no therapy, I would be inclined not to recommend another ICD if she has no arrhythmias between now and time for another device in a couple of years. ICD - her medtronic single chamber ICD is working normally. No change in medical therapy. HTN - he bp is under better control. No change in her meds. She does not have much increase in her voltage on the 12 lead ECG. CAD - she denies anginal symptoms with no problems since her MI over 20 years ago.   April Lakresha Stifter,MD

## 2024-10-15 ENCOUNTER — Other Ambulatory Visit: Payer: Self-pay | Admitting: Internal Medicine

## 2024-10-15 DIAGNOSIS — Z95810 Presence of automatic (implantable) cardiac defibrillator: Secondary | ICD-10-CM

## 2024-10-17 ENCOUNTER — Telehealth: Payer: Self-pay | Admitting: Internal Medicine

## 2024-10-17 NOTE — Telephone Encounter (Signed)
*  STAT* If patient is at the pharmacy, call can be transferred to refill team.   1. Which medications need to be refilled? (please list name of each medication and dose if known) enalapril  (VASOTEC ) 10 MG tablet  carvedilol  (COREG ) 25 MG tablet  potassium chloride  SA (KLOR-CON  M) 20 MEQ tablet  rosuvastatin  (CRESTOR ) 10 MG tablet   hydrochlorothiazide  (HYDRODIURIL ) 25 MG tablet   2. Would you like to learn more about the convenience, safety, & potential cost savings by using the [facility]? No    3. Are you open to using the [facility] (Type [facility]. No    4. Which pharmacy/location (including street and city if local pharmacy) is medication to be sent to? [facility] - [place], [place] - [address] [place]     5. Do they need a 30 day or 90 day supply? 90 day

## 2024-10-18 NOTE — Telephone Encounter (Signed)
 Done already

## 2024-11-02 ENCOUNTER — Ambulatory Visit

## 2024-11-05 ENCOUNTER — Ambulatory Visit (INDEPENDENT_AMBULATORY_CARE_PROVIDER_SITE_OTHER)

## 2024-11-05 VITALS — Ht 63.5 in | Wt 138.0 lb

## 2024-11-05 DIAGNOSIS — Z0000 Encounter for general adult medical examination without abnormal findings: Secondary | ICD-10-CM | POA: Diagnosis not present

## 2024-11-05 NOTE — Patient Instructions (Addendum)
 Ms. Litzenberger,  Thank you for taking the time for your Medicare Wellness Visit. I appreciate your continued commitment to your health goals. Please review the care plan we discussed, and feel free to reach out if I can assist you further.  Please note that Annual Wellness Visits do not include a physical exam. Some assessments may be limited, especially if the visit was conducted virtually. If needed, we may recommend an in-person follow-up with your provider.  Ongoing Care Seeing your primary care provider every 3 to 6 months helps us  monitor your health and provide consistent, personalized care.   Referrals If a referral was made during today's visit and you haven't received any updates within two weeks, please contact the referred provider directly to check on the status.  Recommended Screenings:  Health Maintenance  Topic Date Due   Zoster (Shingles) Vaccine (2 of 2) 10/02/2017   Flu Shot  07/27/2024   COVID-19 Vaccine (8 - 2025-26 season) 08/27/2024   DEXA scan (bone density measurement)  03/09/2025*   Medicare Annual Wellness Visit  11/05/2025   DTaP/Tdap/Td vaccine (2 - Tdap) 10/14/2031   Pneumococcal Vaccine for age over 23  Completed   Meningitis B Vaccine  Aged Out  *Topic was postponed. The date shown is not the original due date.       11/05/2024    8:47 AM  Advanced Directives  Does Patient Have a Medical Advance Directive? No  Would patient like information on creating a medical advance directive? No - Patient declined    Vision: Annual vision screenings are recommended for early detection of glaucoma, cataracts, and diabetic retinopathy. These exams can also reveal signs of chronic conditions such as diabetes and high blood pressure.  Dental: Annual dental screenings help detect early signs of oral cancer, gum disease, and other conditions linked to overall health, including heart disease and diabetes.  Please see the attached documents for additional preventive  care recommendations.

## 2024-11-05 NOTE — Progress Notes (Signed)
 Subjective:   April [name] is a 83 y.o. female who presents for a [employer] Visit.  Allergies (verified) Codeine and Penicillins   History: Past Medical History:  Diagnosis Date   AICD (automatic cardioverter/defibrillator) present    Arthritis    HTN (hypertension)    Myocardial infarction (HCC)    mild Mi 2000   PONV (postoperative nausea and vomiting)    Ventricular fibrillation (HCC)    Past Surgical History:  Procedure Laterality Date   IMPLANTABLE CARDIOVERTER DEFIBRILLATOR (ICD) GENERATOR CHANGE N/A 10/14/2014   Procedure: ICD GENERATOR CHANGE;  Surgeon: Danelle LELON Birmingham, MD;  Location: [facility] CATH LAB;  Service: Cardiovascular;  Laterality: N/A;   IMPLANTABLE CARDIOVERTER DEFIBRILLATOR IMPLANT  2000; 10-14-2014   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; generator change 09-2014 by Dr Birmingham   TOTAL HIP ARTHROPLASTY Left 01/28/2017   Procedure: LEFT TOTAL HIP ARTHROPLASTY ANTERIOR APPROACH;  Surgeon: Lonni CINDERELLA Poli, MD;  Location: [facility];  Service: Orthopedics;  Laterality: Left;   TOTAL HIP ARTHROPLASTY Right 11/11/2017   Procedure: RIGHT TOTAL HIP ARTHROPLASTY ANTERIOR APPROACH;  Surgeon: Poli Lonni CINDERELLA, MD;  Location: [facility];  Service: Orthopedics;  Laterality: Right;   Family History  Problem Relation Age of Onset   Heart attack Mother    Heart attack Father        x2   Diabetes Maternal Aunt    Diabetes Maternal Grandmother    Heart Problems Maternal Grandmother    Social History   Occupational History   Not on file  Tobacco Use   Smoking status: Former    Current packs/day: 0.00    Types: Cigarettes    Quit date: 10/27/1999    Years since quitting: 25.0   Smokeless tobacco: Never  Vaping Use   Vaping status: Never Used  Substance and Sexual Activity   Alcohol use: No   Drug use: No   Sexual activity: Not on file   Tobacco Counseling Counseling given: Not Answered  SDOH Screenings   Food Insecurity: No  Food Insecurity (11/05/2024)  Housing: Unknown (11/05/2024)  Transportation Needs: No Transportation Needs (11/05/2024)  Utilities: Not At Risk (11/05/2024)  Depression (PHQ2-9): Low Risk  (11/05/2024)  Physical Activity: Sufficiently Active (11/05/2024)  Social Connections: Moderately Integrated (11/05/2024)  Stress: No Stress Concern Present (11/05/2024)  Tobacco Use: Medium Risk (11/05/2024)  Health Literacy: Adequate Health Literacy (11/05/2024)   Depression Screen    11/05/2024    8:53 AM 03/09/2024    9:49 AM 03/15/2023    9:42 AM 09/02/2022   10:33 AM  PHQ 2/9 Scores  PHQ - 2 Score 0 0 0 0  PHQ- 9 Score   0       Data saved with a previous flowsheet row definition     Goals Addressed               This Visit's Progress     Remain Active (pt-stated)        Continue to live in my home.       Visit info / Clinical Intake: Medicare Wellness Visit Type:: Subsequent Annual Wellness Visit Persons participating in visit:: patient Medicare Wellness Visit Mode:: Telephone If telephone:: video declined Because this visit was a virtual/telehealth visit:: pt reported vitals If Telephone or Video please confirm:: I connected with the patient using audio enabled telemedicine application and verified that I am speaking with the correct person using two identifiers Patient Location:: Home Provider Location:: Office Information given  by:: patient Interpreter Needed?: No Pre-visit prep was completed: no AWV questionnaire completed by patient prior to visit?: no Living arrangements:: (!) lives alone Patient's Overall Health Status Rating: excellent Typical amount of pain: none Does pain affect daily life?: no Are you currently prescribed opioids?: no  Dietary Habits and Nutritional Risks How many meals a day?: other: Eats fruit and vegetables daily?: yes Most meals are obtained by: preparing own meals In the last 2 weeks, have you had any of the following?:  none Diabetic:: (!) yes Any non-healing wounds?: no How often do you check your BS?: as needed Would you like to be referred to a Nutritionist or for Diabetic Management? : no  Functional Status Activities of Daily Living (to include ambulation/medication): Independent Ambulation: Independent with device- listed below Home Assistive Devices/Equipment: Eyeglasses Medication Administration: Independent Home Management: Independent Manage your own finances?: yes Primary transportation is: driving Concerns about vision?: no *vision screening is required for [facility]* Concerns about hearing?: no  Fall Screening Falls in the past year?: 0 Number of falls in past year: 0 Was there an injury with Fall?: 0 Fall Risk Category Calculator: 0 Patient Fall Risk Level: Low Fall Risk  Fall Risk Patient at Risk for Falls Due to: No Fall Risks Fall risk Follow up: Falls evaluation completed  Home and Transportation Safety: All rugs have non-skid backing?: N/A, no rugs All stairs or steps have railings?: yes Grab bars in the bathtub or shower?: (!) no Have non-skid surface in bathtub or shower?: yes Good home lighting?: yes Regular seat belt use?: yes Hospital stays in the last year:: no  Cognitive Assessment Difficulty concentrating, remembering, or making decisions? : no Will 6CIT or Mini Cog be Completed: yes  Advance Directives (For Healthcare) Does Patient Have a Medical Advance Directive?: No Would patient like information on creating a medical advance directive?: No - Patient declined  Reviewed/Updated  Reviewed/Updated: Reviewed All (Medical, Surgical, Family, Medications, Allergies, Care Teams, Patient Goals); Medical History; Surgical History; Family History; Medications; Allergies; Care Teams; Patient Goals        Objective:    Today's Vitals   11/05/24 0843  Weight: 138 lb (62.6 kg)  Height: 5' 3.5 (1.613 m)   Body mass index is 24.06 kg/m.  Current Medications  (verified) Outpatient Encounter Medications as of 11/05/2024  Medication Sig   acetaminophen  (TYLENOL ) 500 MG tablet Take 500-1,000 mg every 6 (six) hours as needed by mouth for moderate pain or headache.    aspirin  EC 81 MG tablet Take 81 mg by mouth daily.   carvedilol  (COREG ) 25 MG tablet TAKE 2 TABLETS BY MOUTH TWICE  DAILY WITH MEALS   diphenhydrAMINE  (BENADRYL ) 25 MG tablet Take 50 mg by mouth at bedtime as needed for allergies.   enalapril  (VASOTEC ) 10 MG tablet TAKE 1 TABLET BY MOUTH TWICE  DAILY   hydrochlorothiazide  (HYDRODIURIL ) 25 MG tablet TAKE 1 TABLET BY MOUTH DAILY   metFORMIN  (GLUCOPHAGE ) 500 MG tablet TAKE 1 TABLET BY MOUTH DAILY   Omega-3 Fatty Acids (FISH OIL ) 1000 MG CAPS Take 2 capsules (2,000 mg total) by mouth daily.   potassium chloride  SA (KLOR-CON  M) 20 MEQ tablet TAKE 1 TABLET BY MOUTH DAILY   rosuvastatin  (CRESTOR ) 10 MG tablet TAKE 1 TABLET BY MOUTH DAILY   No facility-administered encounter medications on file as of 11/05/2024.   Hearing/Vision screen Hearing Screening - Comments:: Denies hearing difficulties   Vision Screening - Comments:: Wears rx glasses - up to date with routine eye exams  with  [employer] Immunizations and Health Maintenance Health Maintenance  Topic Date Due   Zoster Vaccines- Shingrix (2 of 2) 10/02/2017   Influenza Vaccine  07/27/2024   COVID-19 Vaccine (8 - 2025-26 season) 08/27/2024   DEXA SCAN  03/09/2025 (Originally 09/29/2006)   Medicare Annual Wellness (AWV)  11/05/2025   DTaP/Tdap/Td (2 - Tdap) 10/14/2031   Pneumococcal Vaccine: 50+ Years  Completed   Meningococcal B Vaccine  Aged Out        Assessment/Plan:  This is a routine wellness examination for [facility].  Patient Care Team: Merna Huxley, NP as PCP - General (Family Medicine) Waddell Danelle ORN, MD as PCP - Electrophysiology (Cardiology)  I have personally reviewed and noted the following in the patient's chart:   Medical and social history Use of alcohol,  tobacco or illicit drugs  Current medications and supplements including opioid prescriptions. Functional ability and status Nutritional status Physical activity Advanced directives List of other physicians Hospitalizations, surgeries, and ER visits in previous 12 months Vitals Screenings to include cognitive, depression, and falls Referrals and appointments  No orders of the defined types were placed in this encounter.  In addition, I have reviewed and discussed with patient certain preventive protocols, quality metrics, and best practice recommendations. A written personalized care plan for preventive services as well as general preventive health recommendations were provided to patient.   Rojelio ORN Blush, LPN   [id number]   Return in 1 year (on 11/05/2025).  After Visit Summary: (Declined) Due to this being a telephonic visit, with patients personalized plan was offered to patient but patient Declined AVS at this time   Nurse Notes:

## 2024-12-04 ENCOUNTER — Ambulatory Visit: Payer: 59

## 2024-12-06 ENCOUNTER — Ambulatory Visit: Payer: Self-pay | Admitting: Internal Medicine

## 2024-12-06 LAB — CUP PACEART REMOTE DEVICE CHECK
Battery Remaining Longevity: 15 mo
Battery Voltage: 2.93 V
Brady Statistic RV Percent Paced: 0.04 %
Date Time Interrogation Session: 20251209033524
HighPow Impedance: 40 Ohm
HighPow Impedance: 51 Ohm
Implantable Lead Connection Status: 753985
Implantable Lead Implant Date: 20000821
Implantable Lead Location: 753860
Implantable Lead Model: 6942
Implantable Pulse Generator Implant Date: 20151019
Lead Channel Impedance Value: 399 Ohm
Lead Channel Impedance Value: 399 Ohm
Lead Channel Pacing Threshold Amplitude: 0.375 V
Lead Channel Pacing Threshold Pulse Width: 0.4 ms
Lead Channel Sensing Intrinsic Amplitude: 3.75 mV
Lead Channel Sensing Intrinsic Amplitude: 3.75 mV
Lead Channel Setting Pacing Amplitude: 2 V
Lead Channel Setting Pacing Pulse Width: 0.4 ms
Lead Channel Setting Sensing Sensitivity: 0.3 mV

## 2024-12-11 NOTE — Progress Notes (Signed)
 Remote ICD Transmission

## 2024-12-23 ENCOUNTER — Other Ambulatory Visit: Payer: Self-pay | Admitting: Adult Health

## 2024-12-25 NOTE — Telephone Encounter (Signed)
 Patient needs appointment.
